# Patient Record
Sex: Male | Born: 1945 | Race: White | Hispanic: No | Marital: Single | Smoking: Former smoker
Health system: Southern US, Community
[De-identification: ages and names within clinical notes are randomized; demographics above are authoritative.]

## PROBLEM LIST (undated history)

## (undated) DIAGNOSIS — I639 Cerebral infarction, unspecified: Secondary | ICD-10-CM

## (undated) DIAGNOSIS — I10 Essential (primary) hypertension: Secondary | ICD-10-CM

## (undated) DIAGNOSIS — E119 Type 2 diabetes mellitus without complications: Secondary | ICD-10-CM

## (undated) HISTORY — PX: KNEE SURGERY: SHX244

## (undated) HISTORY — PX: PROSTATE SURGERY: SHX751

---

## 1998-06-20 ENCOUNTER — Other Ambulatory Visit: Admission: RE | Admit: 1998-06-20 | Discharge: 1998-06-20 | Payer: Self-pay | Admitting: Urology

## 1999-05-24 ENCOUNTER — Other Ambulatory Visit: Admission: RE | Admit: 1999-05-24 | Discharge: 1999-05-24 | Payer: Self-pay | Admitting: Urology

## 1999-07-20 ENCOUNTER — Encounter: Payer: Self-pay | Admitting: Urology

## 1999-07-23 ENCOUNTER — Inpatient Hospital Stay (HOSPITAL_COMMUNITY): Admission: RE | Admit: 1999-07-23 | Discharge: 1999-07-26 | Payer: Self-pay | Admitting: Urology

## 2004-08-01 ENCOUNTER — Ambulatory Visit: Payer: Self-pay | Admitting: *Deleted

## 2006-02-05 ENCOUNTER — Ambulatory Visit: Payer: Self-pay | Admitting: Family Medicine

## 2006-02-12 ENCOUNTER — Ambulatory Visit: Payer: Self-pay | Admitting: Family Medicine

## 2006-09-25 ENCOUNTER — Ambulatory Visit: Payer: Self-pay | Admitting: Family Medicine

## 2013-07-31 ENCOUNTER — Emergency Department (HOSPITAL_COMMUNITY): Payer: Medicare Other

## 2013-07-31 ENCOUNTER — Inpatient Hospital Stay (HOSPITAL_COMMUNITY): Payer: Medicare Other

## 2013-07-31 ENCOUNTER — Encounter (HOSPITAL_COMMUNITY): Payer: Self-pay | Admitting: Emergency Medicine

## 2013-07-31 ENCOUNTER — Inpatient Hospital Stay (HOSPITAL_COMMUNITY)
Admission: EM | Admit: 2013-07-31 | Discharge: 2013-08-05 | DRG: 066 | Disposition: A | Discharge status: Rehabilitation Facility | Payer: Medicare Other | Attending: Family Medicine | Admitting: Family Medicine

## 2013-07-31 DIAGNOSIS — Z87891 Personal history of nicotine dependence: Secondary | ICD-10-CM

## 2013-07-31 DIAGNOSIS — I10 Essential (primary) hypertension: Secondary | ICD-10-CM | POA: Diagnosis present

## 2013-07-31 DIAGNOSIS — F418 Other specified anxiety disorders: Secondary | ICD-10-CM | POA: Diagnosis present

## 2013-07-31 DIAGNOSIS — R471 Dysarthria and anarthria: Secondary | ICD-10-CM

## 2013-07-31 DIAGNOSIS — E871 Hypo-osmolality and hyponatremia: Secondary | ICD-10-CM

## 2013-07-31 DIAGNOSIS — E119 Type 2 diabetes mellitus without complications: Secondary | ICD-10-CM | POA: Diagnosis present

## 2013-07-31 DIAGNOSIS — IMO0002 Reserved for concepts with insufficient information to code with codable children: Secondary | ICD-10-CM

## 2013-07-31 DIAGNOSIS — H532 Diplopia: Secondary | ICD-10-CM | POA: Diagnosis present

## 2013-07-31 DIAGNOSIS — F1010 Alcohol abuse, uncomplicated: Secondary | ICD-10-CM | POA: Diagnosis present

## 2013-07-31 DIAGNOSIS — R4789 Other speech disturbances: Secondary | ICD-10-CM | POA: Diagnosis present

## 2013-07-31 DIAGNOSIS — I6350 Cerebral infarction due to unspecified occlusion or stenosis of unspecified cerebral artery: Principal | ICD-10-CM | POA: Diagnosis present

## 2013-07-31 DIAGNOSIS — F341 Dysthymic disorder: Secondary | ICD-10-CM | POA: Diagnosis present

## 2013-07-31 DIAGNOSIS — Z7982 Long term (current) use of aspirin: Secondary | ICD-10-CM

## 2013-07-31 DIAGNOSIS — R29898 Other symptoms and signs involving the musculoskeletal system: Secondary | ICD-10-CM | POA: Diagnosis present

## 2013-07-31 DIAGNOSIS — R29810 Facial weakness: Secondary | ICD-10-CM | POA: Diagnosis present

## 2013-07-31 DIAGNOSIS — R531 Weakness: Secondary | ICD-10-CM

## 2013-07-31 DIAGNOSIS — I639 Cerebral infarction, unspecified: Secondary | ICD-10-CM | POA: Diagnosis present

## 2013-07-31 DIAGNOSIS — Z79899 Other long term (current) drug therapy: Secondary | ICD-10-CM

## 2013-07-31 DIAGNOSIS — Z7902 Long term (current) use of antithrombotics/antiplatelets: Secondary | ICD-10-CM

## 2013-07-31 HISTORY — DX: Type 2 diabetes mellitus without complications: E11.9

## 2013-07-31 HISTORY — DX: Essential (primary) hypertension: I10

## 2013-07-31 LAB — CBC
Hemoglobin: 15.5 g/dL (ref 13.0–17.0)
MCH: 33.5 pg (ref 26.0–34.0)
MCV: 95.9 fL (ref 78.0–100.0)
Platelets: 257 10*3/uL (ref 150–400)
RDW: 11.9 % (ref 11.5–15.5)
WBC: 7.7 10*3/uL (ref 4.0–10.5)

## 2013-07-31 LAB — COMPREHENSIVE METABOLIC PANEL
ALT: 53 U/L (ref 0–53)
AST: 33 U/L (ref 0–37)
Calcium: 10.3 mg/dL (ref 8.4–10.5)
Chloride: 94 mEq/L — ABNORMAL LOW (ref 96–112)
GFR calc non Af Amer: 84 mL/min — ABNORMAL LOW (ref >90)
Sodium: 134 mEq/L — ABNORMAL LOW (ref 135–145)
Total Protein: 7.4 g/dL (ref 6.0–8.3)

## 2013-07-31 LAB — DIFFERENTIAL
Basophils Absolute: 0 10*3/uL (ref 0.0–0.1)
Eosinophils Absolute: 0.1 10*3/uL (ref 0.0–0.7)
Eosinophils Relative: 1 % (ref 0–5)
Lymphocytes Relative: 15 % (ref 12–46)
Lymphs Abs: 1.1 10*3/uL (ref 0.7–4.0)
Neutro Abs: 5.8 10*3/uL (ref 1.7–7.7)
Neutrophils Relative %: 75 % (ref 43–77)

## 2013-07-31 LAB — URINALYSIS, ROUTINE W REFLEX MICROSCOPIC
Glucose, UA: NEGATIVE mg/dL
Hgb urine dipstick: NEGATIVE
Leukocytes, UA: NEGATIVE
Nitrite: NEGATIVE
Protein, ur: NEGATIVE mg/dL
Specific Gravity, Urine: 1.023 (ref 1.005–1.030)
Urobilinogen, UA: 0.2 mg/dL (ref 0.0–1.0)

## 2013-07-31 LAB — POCT I-STAT TROPONIN I: Troponin i, poc: 0 ng/mL (ref 0.00–0.08)

## 2013-07-31 LAB — GLUCOSE, CAPILLARY
Glucose-Capillary: 166 mg/dL — ABNORMAL HIGH (ref 70–99)
Glucose-Capillary: 115 mg/dL — ABNORMAL HIGH (ref 70–99)

## 2013-07-31 LAB — PROTIME-INR
INR: 1 (ref 0.00–1.49)
Prothrombin Time: 13 seconds (ref 11.6–15.2)

## 2013-07-31 LAB — TROPONIN I: Troponin I: <0.3 ng/mL (ref <0.30)

## 2013-07-31 LAB — ETHANOL: Alcohol, Ethyl (B): <11 mg/dL (ref 0–11)

## 2013-07-31 LAB — RAPID URINE DRUG SCREEN, HOSP PERFORMED
Amphetamines: NOT DETECTED
Barbiturates: NOT DETECTED
Benzodiazepines: POSITIVE — AB
Opiates: POSITIVE — AB

## 2013-07-31 MED ORDER — ASPIRIN 300 MG RE SUPP: 300.0000 mg | Freq: Once | RECTAL | Status: DC

## 2013-07-31 MED ORDER — ASPIRIN 325 MG PO TABS
325.0000 mg | ORAL_TABLET | Freq: Every day | ORAL | Status: DC
  Administered 2013-08-02 – 2013-08-05 (×3): 325 mg via ORAL
  Filled 2013-07-31 (×5): qty 1

## 2013-07-31 MED ORDER — SODIUM CHLORIDE 0.9 % IV SOLN
Freq: Once | INTRAVENOUS | Status: AC
  Administered 2013-07-31: 17:00:00 via INTRAVENOUS

## 2013-07-31 MED ORDER — THIAMINE HCL 100 MG/ML IJ SOLN
100.0000 mg | Freq: Every day | INTRAMUSCULAR | Status: DC
  Administered 2013-08-01 – 2013-08-03 (×2): 100 mg via INTRAVENOUS
  Filled 2013-07-31 (×5): qty 1

## 2013-07-31 MED ORDER — MORPHINE SULFATE 4 MG/ML IJ SOLN
4.0000 mg | Freq: Once | INTRAMUSCULAR | Status: AC
  Administered 2013-07-31: 4 mg via INTRAVENOUS
  Filled 2013-07-31: qty 1

## 2013-07-31 MED ORDER — SODIUM CHLORIDE 0.9 % IV SOLN
INTRAVENOUS | Status: AC
  Administered 2013-07-31: 20:00:00 via INTRAVENOUS

## 2013-07-31 MED ORDER — FOLIC ACID 1 MG PO TABS
1.0000 mg | ORAL_TABLET | Freq: Every day | ORAL | Status: DC
  Administered 2013-08-02: 1 mg via ORAL
  Filled 2013-07-31 (×4): qty 1

## 2013-07-31 MED ORDER — HEPARIN SODIUM (PORCINE) 5000 UNIT/ML IJ SOLN
5000.0000 [IU] | Freq: Three times a day (TID) | INTRAMUSCULAR | Status: DC
  Administered 2013-07-31 – 2013-08-05 (×15): 5000 [IU] via SUBCUTANEOUS
  Filled 2013-07-31 (×17): qty 1

## 2013-07-31 MED ORDER — SENNOSIDES-DOCUSATE SODIUM 8.6-50 MG PO TABS: 1.0000 | ORAL_TABLET | Freq: Every evening | ORAL | Status: DC | PRN

## 2013-07-31 MED ORDER — VITAMIN B-1 100 MG PO TABS
100.0000 mg | ORAL_TABLET | Freq: Every day | ORAL | Status: DC
  Administered 2013-08-02 – 2013-08-05 (×3): 100 mg via ORAL
  Filled 2013-07-31 (×5): qty 1

## 2013-07-31 MED ORDER — ONDANSETRON HCL 4 MG/2ML IJ SOLN
4.0000 mg | Freq: Once | INTRAMUSCULAR | Status: AC
  Administered 2013-07-31: 4 mg via INTRAVENOUS
  Filled 2013-07-31: qty 2

## 2013-07-31 MED ORDER — FLUTICASONE PROPIONATE 50 MCG/ACT NA SUSP
2.0000 | Freq: Two times a day (BID) | NASAL | Status: DC
  Administered 2013-07-31 – 2013-08-03 (×7): 2 via NASAL
  Filled 2013-07-31 (×2): qty 16

## 2013-07-31 MED ORDER — ASPIRIN 300 MG RE SUPP
300.0000 mg | Freq: Every day | RECTAL | Status: DC
  Administered 2013-07-31 – 2013-08-01 (×2): 300 mg via RECTAL
  Filled 2013-07-31 (×6): qty 1

## 2013-07-31 MED ORDER — LORAZEPAM 2 MG/ML IJ SOLN
1.0000 mg | Freq: Four times a day (QID) | INTRAMUSCULAR | Status: DC | PRN
  Administered 2013-07-31 – 2013-08-01 (×2): 1 mg via INTRAVENOUS
  Filled 2013-07-31 (×2): qty 1

## 2013-07-31 MED ORDER — ADULT MULTIVITAMIN W/MINERALS CH
1.0000 | ORAL_TABLET | Freq: Every day | ORAL | Status: DC
  Administered 2013-08-02: 1 via ORAL
  Filled 2013-07-31 (×4): qty 1

## 2013-07-31 MED ORDER — LORAZEPAM 1 MG PO TABS: 1.0000 mg | ORAL_TABLET | Freq: Four times a day (QID) | ORAL | Status: DC | PRN

## 2013-07-31 NOTE — ED Provider Notes (Signed)
CSN: [id number]     Arrival date & time 07/31/13  1324 History   First MD Initiated Contact with Patient 07/31/13 1340     Chief Complaint  Patient presents with  . Facial Droop   (Consider location/radiation/quality/duration/timing/severity/associated sxs/prior Treatment) HPI Patient brought in by son for dysarthria that patient states began around 11am. Son last saw patient last night.  Has had 2- 3 weeks of headache and neck pain, 1 week of gait imbalance, 2 days of diplopia, and 1 day of dysarthria and left sided facial droop.  Pt was seen at [facility] ED for diplopia last night, had a negative CT scan and was sent home with diagnosis TIA and right sided weakness (per discharge papers), supposed to follow up with PCP for MRI.  Patient states he has had a severe headache for the past 2-3 weeks as well as neck pain.  He has been treated for sinusitis with some improvement. He was also seen by his PCP who thought he may have shingles on his head causing his headache.  Patient and son state patient has been off balance for approximately 1 week.  States he does have sore throat and difficulty swallowing.  Double vision is persistent and is stacked vertically.  Denies fevers, chills, nasal congestion, cough, chest pain, abdominal pain, N/V/D, leg swelling, palpitations.     Past Medical History  Diagnosis Date  . Diabetes mellitus without complication   . Hypertension    Past Surgical History  Procedure Laterality Date  . Prostate surgery    . Knee surgery     No family history on file. History  Substance Use Topics  . Smoking status: Former Games developer  . Smokeless tobacco: Not on file  . Alcohol Use: Yes     Comment: occ    Review of Systems  Unable to perform ROS: Acuity of condition  Constitutional: Negative for fever and chills.  HENT: Positive for sore throat.   Eyes: Positive for visual disturbance.  Respiratory: Positive for shortness of breath (with many people crowding around him  and with lying flat).   Cardiovascular: Negative for chest pain, palpitations and leg swelling.  Gastrointestinal: Negative for vomiting, abdominal pain and diarrhea.  Endocrine: Positive for polyuria.  Genitourinary: Positive for frequency. Negative for dysuria.  Musculoskeletal: Positive for neck pain. Negative for back pain.  Allergic/Immunologic: Negative for immunocompromised state.  Neurological: Positive for dizziness, weakness, light-headedness and headaches.  Hematological: Does not bruise/bleed easily.    Allergies  Review of patient's allergies indicates no known allergies.  Home Medications  No current outpatient prescriptions on file. BP 129/86  Pulse 86  Resp 18  SpO2 98% Physical Exam  Nursing note and vitals reviewed. Constitutional: He appears well-developed and well-nourished. No distress.  HENT:  Head: Normocephalic and atraumatic.  Neck: Neck supple.  Cardiovascular: Normal rate and regular rhythm.   Pulmonary/Chest: Effort normal and breath sounds normal. No respiratory distress. He has no wheezes. He has no rales.  Abdominal: Soft. He exhibits no distension and no mass. There is no tenderness. There is no rebound and no guarding.  Neurological: He is alert. He is not disoriented. A cranial nerve deficit is present. He exhibits normal muscle tone. Coordination abnormal. GCS eye subscore is 4. GCS verbal subscore is 5. GCS motor subscore is 6.  Left facial droop. +dysarthria EOMs intact, tongue is midline, soft palate raises symmetrically.  Finger to nose abnormal, Left worse than right.  Grip strengths and extremity strength equal.  Pt  unable to do heel to shin due to generalized weakness.  He flops hands abnormally with ROM.  No pronator drift, though patient generally weak and unable to fully raise hands in front of him.   Skin: He is not diaphoretic.    ED Course  Procedures (including critical care time) Labs Review Labs Reviewed  GLUCOSE, CAPILLARY -  Abnormal; Notable for the following:    Glucose-Capillary 166 (*)    All other components within normal limits  COMPREHENSIVE METABOLIC PANEL - Abnormal; Notable for the following:    Sodium 134 (*)    Chloride 94 (*)    Glucose, Bld 157 (*)    GFR calc non Af Amer 84 (*)    All other components within normal limits  PROTIME-INR  APTT  CBC  DIFFERENTIAL  TROPONIN I  ETHANOL  URINALYSIS, ROUTINE W REFLEX MICROSCOPIC  URINE RAPID DRUG SCREEN (HOSP PERFORMED)  POCT I-STAT TROPONIN I   Imaging Review Ct Head Wo Contrast  07/31/2013   CLINICAL DATA:  Left-sided weakness, facial droop.  EXAM: CT HEAD WITHOUT CONTRAST  TECHNIQUE: Contiguous axial images were obtained from the base of the skull through the vertex without intravenous contrast.  COMPARISON:  07/30/2013  FINDINGS: Atherosclerotic and physiologic intracranial calcifications. Mild atrophy. There is no evidence of acute intracranial hemorrhage, brain edema, mass lesion, acute infarction, mass effect, or midline shift. Acute infarct may be inapparent on noncontrast CT. No other intra-axial abnormalities are seen, and the ventricles and sulci are within normal limits in size and symmetry. No abnormal extra-axial fluid collections or masses are identified. No significant calvarial abnormality.  IMPRESSION: No bleed or other acute intracranial process.   Electronically Signed   By: Oley Balm M.D.   On: 07/31/2013 15:03   Dg Chest Portable 1 View  07/31/2013   CLINICAL DATA:  Diabetes.  Facial droop.  EXAM: PORTABLE CHEST - 1 VIEW  COMPARISON:  None.  FINDINGS: The heart size and mediastinal contours are within normal limits. Both lungs are clear. The visualized skeletal structures are unremarkable.  IMPRESSION: No active disease.   Electronically Signed   By: Maisie Fus  Register   On: 07/31/2013 15:32    EKG Interpretation   None      1:53 PM Dr Juleen China made aware and has seen and examined the patient.   3:25 PM Discussed patient  with Family Medicine resident who accepts patient for admission.    Date: 07/31/2013  Rate: 87  Rhythm: normal sinus rhythm  QRS Axis: normal  Intervals: normal  ST/T Wave abnormalities: nonspecific ST/T changes  Conduction Disutrbances:none  Narrative Interpretation:   Old EKG Reviewed: no significant changes.  T wave flattening has improved    MDM   1. Dysarthria   2. Facial droop   3. Diplopia      Patient with several weeks of various symptoms including severe headache and neck pain that was gradual in onset 2-3 weeks ago, has improved since then, 1 week of being off balance, 2 days of diplopia, 1 day of dysarthria and facial droop.  [place] dx with sinusitis, by PCP 5 days ago thought to have shingles, at [facility] ED last night, diagnosed with TIA and right sided weakness and d/c home.  PCP is Dr Lysbeth Galas in [place], [place].   Labs unremarkable, CT negative.  MRI ordered.  Admitted for presumes stroke.  Pending swallow study, aspirin not given - Family Medicine resident made aware.  Patient admitted to [facility].      Trixie Dredge, PA-C 07/31/13 [phone]

## 2013-07-31 NOTE — ED Notes (Signed)
Per son pt was last seen normal at 0800 yesterday am, states pt woke up yesterday not feeling well, states approx 1100 they noticed pt's speech had become slurred, unable to keep his balance, and Left side weakness. Pt was seen yesterday at an ED in [place] had a CT scan that was negative for a stroke and sent home. Pt's symptoms worsened over night and now has Left side facial droop, increase slurred speech, drooling from Left side of mouth, and confused

## 2013-07-31 NOTE — ED Notes (Signed)
Family at bedside and updated on pt's status 

## 2013-07-31 NOTE — ED Notes (Signed)
Pt still in MRI, tech called requesting an order for an MRA due to brain stem infarct, EDP Kohut placed order

## 2013-07-31 NOTE — ED Notes (Signed)
Attempted to obtain urine sample via in and out cath. Attempt unsuccessful.

## 2013-07-31 NOTE — H&P (Signed)
Family Medicine Teaching [facility] Admission History and Physical Service Pager: [phone]  Patient name: Keith [name] Medical record number: [id number] Date of birth: 12/21/1945 Age: 67 y.o. Gender: male  Primary Care Provider: Josue Hector, MD Consultants: None Code Status: Full  Chief Complaint: Dysarthria  Assessment and Plan: Keith [name] is a 67 y.o. male presenting with dysarthria, left-sided facial droop, and left-sided UE & LE weakness. PMH is significant for DM, HTN, previous smoker.   # Dysarthria and Facial, UE, LE weakness secondary to Stroke Has been on ASA. CT shows atherosclerotic and physiologic intracranial calcifications w/o signs of acute stroke or hemorrhage. EKG showed NSR. I-stat Troponin neg in ED. 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 - Risk Stratification: ECHO, Carotid doppler, A1c, Lipid, TSH - Admit to tele; Neuro checks, NPO until Swallow study - ASA daily; Will initiate Plavix once able to take PO - Will start statin when able to take PO - PT/OT to eval and treat - Morphine given in ED for HA and anxiety about MRI - UDS and UA pending   # DM - Alc ordered - Holding Metformin while NPO - CBGs Q 4 hours; No insulin at this time as patient is NPO  #HTN - Allow permissive HTN < 220/110 for 24hrs - Holding Cozaar 100mg  qd & Norvasc 5mg  qd; Will restart on 12/1 if needed or at discharge   # Alcohol Abuse - Reports drinking 4-5 beers daily; last drink ~ 1 wk ago; Ethanol level neg on admission - CIWA protocol  FEN/GI: NS @ 75mg ; NPO until passes swallow study Prophylaxis: Heparin subq  Disposition: Admit to tele; Attending Randolm Idol; discharge home pending PT/OT recs  History of Present Illness: Keith [name] is a 67 y.o. male presenting with Dysarthria and left-sided facial droop. He says this began this morning ~ 10:30am  when he woke-up. Yesterday he had Diplopia and was told by [facility] ED that he had TIA after CT was negative. He denies any past strokes or transient diplopia or dysarthria. He denies any chest pain or palpitations, but endorses HA and neck pain for the past several weeks w/o vision or hearing changes or scalp tenderness. He is a previous smoker (2-3ppd for many years), but current does not smoke. He reports drinking 4-5 beers daily; last drink ~ 1 wk ago. He takes a aspirin daily and has been on simvastatin before but stopped it due to hearing that it was "dangerous."  Review Of Systems: Per HPI with the following additions: None Otherwise 12 point review of systems was performed and was unremarkable.  Patient Active Problem List   Diagnosis Date Noted  . Facial droop 07/31/2013   Past Medical History: Past Medical History  Diagnosis Date  . Diabetes mellitus without complication   . Hypertension    Past Surgical History: Past Surgical History  Procedure Laterality Date  . Prostate surgery    . Knee surgery     Social History: History  Substance Use Topics  . Smoking status: Former Games developer  . Smokeless tobacco: Not on file  . Alcohol Use: Yes     Comment: occ   Additional social history: Past smoker  Please also refer to relevant sections of EMR.  Family History: No family history on file. Allergies and Medications: No Known Allergies No current facility-administered medications on file prior to encounter.   No current outpatient prescriptions on file prior to  encounter.    Objective: BP 129/86  Pulse 86  Resp 18  SpO2 98% Exam: General: Elderly male in NAD Neuro: A&Ox4; Short & Long term memory intact; Left-side facial droop, dysarthria, 4/5 weakness with left-side shoulder shrug  other CNs intact; 4/5 strength in Left-side grip & elbow flexion; 3/5 left-side hip flexion; Gross Sensory intact HEENT: Mucus membranes mildly dry; PERRLA CV: RRR, No m/r/g; No Carotid  bruits; No JVD; No LE Edema Lungs: CTAB; Normal Resp. Effort GI:BS +; No tenderness or masses  Labs and Imaging: CBC BMET   Recent Labs Lab 07/31/13 1415  WBC 7.7  HGB 15.5  HCT 44.3  PLT 257    Recent Labs Lab 07/31/13 1415  NA 134*  K 5.1  CL 94*  CO2 29  BUN 20  CREATININE 0.93  GLUCOSE 157*  CALCIUM 10.3      [facility] Contrast 07/31/2013  IMPRESSION: 1. Two small acute brainstem infarcts: -right paracentral pons, - central to left paracentral medulla, possibly involving the left pyramid. 2. No associated mass effect or hemorrhage. 3. Negative intracranial MRA, with patent posterior circulation.  CT Head FINDINGS:  Atherosclerotic and physiologic intracranial calcifications. Mild  atrophy. There is no evidence of acute intracranial hemorrhage,  brain edema, mass lesion, acute infarction, mass effect, or midline  shift. Acute infarct may be inapparent on noncontrast CT. No other  intra-axial abnormalities are seen, and the ventricles and sulci are  within normal limits in size and symmetry. No abnormal extra-axial  fluid collections or masses are identified. No significant calvarial  abnormality.  IMPRESSION:  No bleed or other acute intracranial process  CXR FINDINGS:  The heart size and mediastinal contours are within normal limits.  Both lungs are clear. The visualized skeletal structures are  unremarkable.  IMPRESSION:  No active disease.   Wenda Low, MD 07/31/2013, 4:01 PM PGY-1, [place] [facility] FPTS Intern pager: [phone], text pages welcome  I have seen and examine the above patient and agree with the note.  My additions/addendums are in blue.  Everlene Other DO Family Medicine PGY-2

## 2013-07-31 NOTE — ED Notes (Signed)
Report given to [facility], pt will be transported to 4N room 27 via stretcher on a tele monitor by Mellody Dance EMT

## 2013-07-31 NOTE — ED Notes (Signed)
Patient transported to MRI 

## 2013-07-31 NOTE — ED Notes (Signed)
Per son pt was last seen normal yesterday at 8 am.  Pt was scanned at hospital in [place] and, per son, CT was negative.  Son brought pt in today b/c "he is getting worse".  L sided weakness and facial droop.  Pt ao x 4 with disarthria.

## 2013-07-31 NOTE — ED Notes (Signed)
Pt refused to have MRI due to being claustrophobic. This RN informed MRI it would take me a few minutes due to I was in the middle of another procedure but I would be there.

## 2013-08-01 ENCOUNTER — Inpatient Hospital Stay (HOSPITAL_COMMUNITY): Payer: Medicare Other

## 2013-08-01 DIAGNOSIS — H5310 Unspecified subjective visual disturbances: Secondary | ICD-10-CM

## 2013-08-01 DIAGNOSIS — I10 Essential (primary) hypertension: Secondary | ICD-10-CM

## 2013-08-01 DIAGNOSIS — M6281 Muscle weakness (generalized): Secondary | ICD-10-CM

## 2013-08-01 DIAGNOSIS — E119 Type 2 diabetes mellitus without complications: Secondary | ICD-10-CM

## 2013-08-01 DIAGNOSIS — I519 Heart disease, unspecified: Secondary | ICD-10-CM

## 2013-08-01 DIAGNOSIS — H532 Diplopia: Secondary | ICD-10-CM

## 2013-08-01 DIAGNOSIS — R471 Dysarthria and anarthria: Secondary | ICD-10-CM

## 2013-08-01 DIAGNOSIS — R29810 Facial weakness: Secondary | ICD-10-CM

## 2013-08-01 DIAGNOSIS — I6350 Cerebral infarction due to unspecified occlusion or stenosis of unspecified cerebral artery: Principal | ICD-10-CM

## 2013-08-01 LAB — LIPID PANEL
LDL Cholesterol: 58 mg/dL (ref 0–99)
Total CHOL/HDL Ratio: 2.4 RATIO
Triglycerides: 92 mg/dL (ref <150)

## 2013-08-01 LAB — GLUCOSE, CAPILLARY
Glucose-Capillary: 101 mg/dL — ABNORMAL HIGH (ref 70–99)
Glucose-Capillary: 107 mg/dL — ABNORMAL HIGH (ref 70–99)
Glucose-Capillary: 111 mg/dL — ABNORMAL HIGH (ref 70–99)
Glucose-Capillary: 114 mg/dL — ABNORMAL HIGH (ref 70–99)
Glucose-Capillary: 130 mg/dL — ABNORMAL HIGH (ref 70–99)
Glucose-Capillary: 133 mg/dL — ABNORMAL HIGH (ref 70–99)

## 2013-08-01 LAB — HEMOGLOBIN A1C
Hgb A1c MFr Bld: 7.4 % — ABNORMAL HIGH (ref <5.7)
Mean Plasma Glucose: 166 mg/dL — ABNORMAL HIGH (ref <117)

## 2013-08-01 MED ORDER — DIPHENHYDRAMINE HCL 50 MG/ML IJ SOLN: 25.0000 mg | Freq: Once | INTRAMUSCULAR | Status: DC

## 2013-08-01 MED ORDER — SODIUM CHLORIDE 0.9 % IV SOLN
INTRAVENOUS | Status: DC
  Administered 2013-08-01 – 2013-08-05 (×8): via INTRAVENOUS

## 2013-08-01 MED ORDER — MORPHINE SULFATE 2 MG/ML IJ SOLN
2.0000 mg | INTRAMUSCULAR | Status: DC | PRN
  Administered 2013-08-01 – 2013-08-05 (×7): 2 mg via INTRAVENOUS
  Filled 2013-08-01 (×7): qty 1

## 2013-08-01 NOTE — Plan of Care (Signed)
Problem: Progression Outcomes Goal: Tolerating diet/TF at goal rate Outcome: Not Met (add Reason) Failed modified barium swallow study

## 2013-08-01 NOTE — Progress Notes (Signed)
FMTS Attending Note  I personally saw and evaluated the patient. The plan of care was discussed with the resident team. I agree with the assessment and plan as documented by the resident.   Danese Dorsainvil MD 

## 2013-08-01 NOTE — Progress Notes (Signed)
Family Medicine Teaching Service Daily Progress Note Intern Pager: [phone]  Patient name: Keith [name] Medical record number: [id number] Date of birth: [birth year] Age: 67 y.o. Gender: male  Primary Care Provider: Josue Hector, MD Consultants: None Code Status: Full Code  Pt Overview and Major Events to Date:   Assessment and Plan: Keith [name] is a 67 y.o. male presenting with dysarthria, left-sided facial droop, and left-sided UE & LE weakness. PMH is significant for DM, HTN, previous smoker.   # Dysarthria and Facial, UE, LE weakness secondary to Stroke  Has been on ASA. CT shows atherosclerotic and physiologic intracranial calcifications w/o signs of acute stroke or hemorrhage. EKG showed NSR. I-stat Troponin neg in ED. CBC&CMET wnl. Lipid panel unremarkable.  - MRI: revealed two small acute brainstem infarcts: right paracentral pons, central to left paracentral medulla, possibly involving the left pyramid.  - Risk Stratification: ECHO, Carotid doppler, A1c, TSH  - ASA daily; Will initiate Plavix once able to take PO  - Will start statin ifable to take PO  - follow-up PT/OT to eval and treat  - Morphine given in ED for HA and anxiety about MRI  - UDS pending  # DM  - A1c ordered  - Holding Metformin while NPO  - CBGs Q 4 hours; No insulin at this time as patient is NPO   #HTN  - Allow permissive HTN < 220/110 for 24hrs  - Holding Cozaar 100mg  qd & Norvasc 5mg  qd; Will restart on 12/1 if needed or at discharge   # Alcohol Abuse  - Reports drinking 4-5 beers daily; last drink ~ 1 wk ago; Ethanol level neg on admission  - CIWA protocol   FEN/GI: NS @ 75mg ; NPO until results of swallow study  Prophylaxis: Heparin subq  Disposition: pending clinical workup, SLP, PT, OT recommendations  Subjective: Patient is feeling better overall. He is still having generalized weakness which he says has not changed.   Objective: Temp:  [98 F (36.7 C)-98.6 F (37 C)] 98 F (36.7  C) (11/30 0629) Pulse Rate:  [59-86] 71 (11/30 0629) Resp:  [15-18] 18 (11/30 0629) BP: (114-136)/(65-86) 117/66 mmHg (11/30 0629) SpO2:  [94 %-100 %] 100 % (11/30 0629) Weight:  [170 lb 6.4 oz (77.293 kg)] 170 lb 6.4 oz (77.293 kg) (11/29 1855)  Physical Exam: General: elderly male, laying in bed in no acute distress, son and daughter in  Cardiovascular: Regular rate/rhythm Respiratory: Clear to auscultation bilaterally, no wheezes Abdomen: Soft, non-tender, non-distended Extremities: No edema Neuro: Alert and oriented x3. Left sided facial droop, CN 3-8 intact. Left CN 9/10 deficit with +Right uvula deviation. Left CN11 deficit with shoulder shrug deficit.  Laboratory:  Recent Labs Lab 07/31/13 1415  WBC 7.7  HGB 15.5  HCT 44.3  PLT 257    Recent Labs Lab 07/31/13 1415  NA 134*  K 5.1  CL 94*  CO2 29  BUN 20  CREATININE 0.93  CALCIUM 10.3  PROT 7.4  BILITOT 0.5  ALKPHOS 59  ALT 53  AST 33  GLUCOSE 157*   Lipid Panel     Component Value Date/Time   CHOL 129 08/01/2013 0624   TRIG 92 08/01/2013 0624   HDL 53 08/01/2013 0624   CHOLHDL 2.4 08/01/2013 0624   VLDL 18 08/01/2013 0624   LDLCALC 58 08/01/2013 0624    Imaging/Diagnostic Tests:  No recent imaging  Jacquelin Hawking, MD 08/01/2013, 6:48 AM PGY-1, [facility] FPTS Intern pager: [phone], text pages welcome

## 2013-08-01 NOTE — Progress Notes (Signed)
  Echocardiogram 2D Echocardiogram has been performed.  Keith [name] 08/01/2013, 9:28 AM

## 2013-08-01 NOTE — Procedures (Addendum)
Objective Swallowing Evaluation: Modified Barium Swallowing Study  Patient Details  Name: Keith [name] MRN: [id number] Date of Birth: Nov 03, 1945  Today's Date: 08/01/2013 Time: 1030-1101 SLP Time Calculation (min): 31 min  Past Medical History:  Past Medical History  Diagnosis Date  . Diabetes mellitus without complication   . Hypertension    Past Surgical History:  Past Surgical History  Procedure Laterality Date  . Prostate surgery    . 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  MBS ordered due to concerns for dysphagia.  Pt admits to h/o "sore throat" and transient problems swallowing in the last few weeks.  Pt is a previous smoker, CXR negative 11/29.       Assessment / Plan / Recommendation Clinical Impression  Dysphagia Diagnosis: Moderate oral phase dysphagia;Severe pharyngeal phase dysphagia;Severe cervical esophageal phase dysphagia   Clinical impression: Pt presents with moderate oral and severe pharyngeal-cervical esophageal phase dysphagia due to brainstem infarcts.  Pt's dysphagia characterized by delayed oral transiting/coordination resulting in premature spillage of boluses into pharynx.  Pharyngeal swallow was delayed with boluses spilling INTO larynx prior to swallow initiation (epiglottis positioning obliterates vallecular space).  Severe residuals noted due to pt weakness that were not prevented with chin tuck with and without head turn left.  Cued dry swallows were not effective to fully clear pharynx nor was cueing pt to "hock" - approx 30% was retained.   Pt is a very high aspiration risk currently due to level of dysphagia due to mostly glossopharyngeal, vagus and hypoglossal nerve deficits.     Skilled intervention included trials of compensation strategies, pt education to findings using monitor  and verbal feedback as well as teach back and goals of treatment.  Pt reports pain in neck and this may limit functional compensation strategies.    Rec consider short term alternative means of nutrition and SLP follow up to determine readiness for repeat instumental eval.  Please note, pt admits to dysphagia symptoms including "left throat swelling" and sore throat for 2 weeks prior to admission.     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  MBS ordered due to concerns for dysphagia.  Pt admits to h/o "sore throat" and transient problems swallowing in the last few weeks.  Pt is a previous smoker, CXR negative 11/29.   Type of Study: Modified Barium Swallowing Study Reason for Referral: Objectively evaluate swallowing function Diet Prior to this Study: NPO Temperature Spikes Noted: No Respiratory Status: Nasal cannula Behavior/Cognition: Alert;Cooperative;Pleasant mood Oral Cavity - Dentition: Adequate natural dentition Oral Motor / Sensory Function: Impaired motor;Impaired sensory Oral impairment: Left lingual;Left facial;Left labial (palatal elevation deviates to right, decreased facial sensation) Self-Feeding  Abilities: Total assist Patient Positioning: Upright in chair Baseline Vocal Quality: Low vocal intensity;Hoarse Volitional Cough: Weak Volitional Swallow: Able to elicit Anatomy:  Other (Comment) (appearance of cervical osteophyte C4-C6, pt reports neck pain) Pharyngeal Secretions: Standing secretions in (comment) (pharynx, mixed with barium and did not clear)    Reason for Referral Objectively evaluate swallowing function   Oral Phase Oral Preparation/Oral Phase Oral Phase: Impaired Oral - Honey Oral - Honey Teaspoon: Delayed oral transit;Weak lingual manipulation;Reduced posterior propulsion;Right anterior bolus loss Oral - Nectar Oral - Nectar Teaspoon: Delayed oral transit;Right anterior bolus loss;Weak lingual manipulation;Reduced posterior propulsion Oral - Thin Oral - Thin Teaspoon: Weak lingual manipulation;Delayed oral transit;Incomplete tongue to palate contact;Reduced posterior propulsion Oral - Solids Oral - Puree: Reduced posterior propulsion;Delayed oral transit;Weak lingual manipulation Oral - Regular: Not tested   Pharyngeal Phase Pharyngeal Phase Pharyngeal Phase: Impaired Pharyngeal - Honey Pharyngeal - Honey Teaspoon: Pharyngeal residue - valleculae;Pharyngeal residue - pyriform sinuses;Reduced tongue base retraction;Penetration/Aspiration before swallow;Penetration/Aspiration after swallow;Reduced laryngeal elevation;Reduced anterior laryngeal mobility;Reduced epiglottic inversion;Reduced pharyngeal peristalsis;Reduced airway/laryngeal closure Penetration/Aspiration details (honey teaspoon): Material enters airway, CONTACTS cords and not ejected out Pharyngeal - Nectar Pharyngeal - Nectar Teaspoon: Delayed swallow initiation;Premature spillage to valleculae;Reduced pharyngeal peristalsis;Reduced epiglottic inversion;Reduced anterior laryngeal mobility;Reduced laryngeal elevation;Reduced airway/laryngeal closure;Reduced tongue base retraction;Pharyngeal residue - pyriform sinuses;Pharyngeal residue - valleculae;Penetration/Aspiration before swallow;Penetration/Aspiration after swallow Penetration/Aspiration details (nectar teaspoon): Material enters  airway, passes BELOW cords and not ejected out despite cough attempt by patient Pharyngeal - Thin Pharyngeal - Thin Teaspoon: Premature spillage to pyriform sinuses;Reduced pharyngeal peristalsis;Reduced epiglottic inversion;Reduced anterior laryngeal mobility;Reduced laryngeal elevation;Reduced airway/laryngeal closure;Reduced tongue base retraction;Penetration/Aspiration before swallow;Lateral channel residue;Pharyngeal residue - valleculae;Pharyngeal residue - pyriform sinuses;Delayed swallow initiation Penetration/Aspiration details (thin teaspoon): Material enters airway, CONTACTS cords and not ejected out Pharyngeal - Solids Pharyngeal - Puree: Premature spillage to valleculae;Delayed swallow initiation;Reduced pharyngeal peristalsis;Reduced epiglottic inversion;Reduced anterior laryngeal mobility;Reduced laryngeal elevation;Reduced airway/laryngeal closure;Reduced tongue base retraction;Penetration/Aspiration before swallow;Lateral channel residue;Pharyngeal residue - valleculae;Pharyngeal residue - pyriform sinuses Penetration/Aspiration details (puree): Material enters airway, remains ABOVE vocal cords and not ejected out Pharyngeal - Regular: Not tested  Cervical Esophageal Phase    GO    Cervical Esophageal Phase Cervical Esophageal Phase: Impaired Cervical Esophageal Phase - Comment Cervical Esophageal Comment: decreased UES opening likely due to poor laryngeal elevation, results in stasis that mixed with barium - pt can not clear with cued "hock" nor dry swallows         Donavan Burnet, MS [facility] SLP [phone]

## 2013-08-01 NOTE — Progress Notes (Signed)
VASCULAR LAB PRELIMINARY  PRELIMINARY  PRELIMINARY  PRELIMINARY  Carotid Dopplers completed.    Preliminary report:  There is 1-39% ICA stenosis.  Vertebral artery flow is antegrade.  Keith [name], RVT 08/01/2013, 9:17 AM

## 2013-08-01 NOTE — H&P (Signed)
FMTS Attending Note  I personally saw and evaluated the patient. The plan of care was discussed with the resident team. I agree with the assessment and plan as documented by the resident.   67 y/o male with PMH diabetes, hypertension, former smoker presents with left sided facial droop and slurred speech that has been worsening over the past few days, Please refer to resident note for full HPI. Patient continues to have left sided facial droop, dysarthria, and generalized weakness (L>R).  Vitals: reviewed Gen: pleasant caucasian male, sitting in hospital bed, accompanied by his son, NAD, difficult to understand speech due to dysarthria HEENT: PERRL, EOMI, no scleral icterus, nasal septum midline, no rhinorrhea, MMM, uvula midline, no pharyngeal erythema or exudate, bilateral TM pearly grey without erythema or bulging, neck supple, no thyromegally, no cervical adenopathy Cardiac: RRR, S1 and S2 present, no murmurs, no heaves/thrills, no JVD, no carotid bruits Resp: CTAB, normal effort Abd: soft, no tenderness, normal bowel sounds Neuro: CN 2- 12 intact (except left sided facial droop, dysarthria, shug slightly weaker on the left side), strength 4/5 in all extremities, no gross sensation deficits, 2 + biceps/brachialadialis/patellar/achilles reflexes bilaterally, bilateral finger to nose intact, no pronator drift Ext: no edema, 2+ radial and DP pulses Skin: no rash  1. Dysarthria/facial droop/weakness - MRI consistent with brainstem stroke, will add Plavix to home ASA 325 for 30 days then transition to Plavix, start on Lipitor 40 mg daily, awaiting risk statification labs, appreciate PT/OT recs 2. Diabetes - agree with A1C, hold Metformin while NPO 3. HTN - controlled off medications, hold home medications to allow permissive hypertension  Donnella Sham MD

## 2013-08-01 NOTE — Evaluation (Signed)
Physical Therapy Evaluation Patient Details Name: Keith [name] MRN: [id number] DOB: 1946/05/22 Today's Date: 08/01/2013 Time: 7829-5621 PT Time Calculation (min): 26 min  PT Assessment / Plan / Recommendation History of Present Illness  Keith [name] is a 67 y.o. male presenting with dysarthria, left-sided facial droop, and left-sided UE & LE weakness. PMH is significant for DM, HTN, previous smoker. MRI finds:  right paracentral pons; left paracentral medulla  Clinical Impression  Pt admitted with the above. Pt currently with functional limitations due to the deficits listed below (see PT Problem List). Pt with right sided weakness limiting ambulation and causing balance impairment.  Pt will benefit from skilled PT to increase their independence and safety with mobility to allow discharge to the venue listed below.      PT Assessment  Patient needs continued PT services    Follow Up Recommendations  CIR    Equipment Recommendations  Other (comment) (TBD)    Recommendations for Other Services Rehab consult   Frequency Min 4X/week    Precautions / Restrictions Precautions Precautions: Fall   Pertinent Vitals/Pain No c/o pain      Mobility  Bed Mobility Bed Mobility: Supine to Sit;Sitting - Scoot to Edge of Bed Supine to Sit: With rails;4: Min assist Sitting - Scoot to [place] of Bed: 4: Min assist Sit to Supine: 4: Min assist Details for Bed Mobility Assistance: (A) for overall balance during supine > sit Transfers Transfers: Sit to Stand;Stand to Sit Sit to Stand: 1: +2 Total assist;From bed Sit to Stand: Patient Percentage: 50% Stand to Sit: 1: +2 Total assist;To bed Stand to Sit: Patient Percentage: 50% Details for Transfer Assistance: +2 (A) to initiate transfer and slowly descend with cues for hand placement.  Cues for midline and forward translation during transfers.  Ambulation/Gait Ambulation/Gait Assistance: 1: +2 Total assist Ambulation/Gait: Patient Percentage:  50% Ambulation Distance (Feet): 4 Feet (side steps to [facility]) Assistive device: 2 person hand held assist Ambulation/Gait Assistance Details: (A) to maintain balance with cues for proper weight shift to advance LEs.  Gait Pattern: Step-to pattern;Decreased stance time - right;Shuffle;Lateral trunk lean to right Gait velocity: decreased Stairs: No Modified Rankin (Stroke Patients Only) Pre-Morbid Rankin Score: No symptoms Modified Rankin: Severe disability    Exercises     PT Diagnosis: Difficulty walking;Generalized weakness  PT Problem List: Decreased strength;Decreased activity tolerance;Decreased balance;Decreased mobility;Decreased coordination;Decreased cognition;Decreased knowledge of use of DME;Decreased safety awareness PT Treatment Interventions: DME instruction;Gait training;Functional mobility training;Therapeutic activities;Therapeutic exercise;Balance training;Neuromuscular re-education;Cognitive remediation;Patient/family education     PT Goals(Current goals can be found in the care plan section) Acute Rehab PT Goals Patient Stated Goal: did not set PT Goal Formulation: With patient/family Time For Goal Achievement: 08/15/13 Potential to Achieve Goals: Good  Visit Information  Last PT Received On: 08/01/13 Assistance Needed: +2 History of Present Illness: Keith [name] is a 67 y.o. male presenting with dysarthria, left-sided facial droop, and left-sided UE & LE weakness. PMH is significant for DM, HTN, previous smoker. MRI finds:  right paracentral pons; left paracentral medulla       Prior Functioning  Home Living Family/patient expects to be discharged to:: Private residence Living Arrangements: Alone Available Help at Discharge: Family Type of Home: House Home Access: Stairs to enter Secretary/administrator of Steps: 6 Entrance Stairs-Rails: Right;Left Prior Function Level of Independence: Independent Communication Communication: HOH (slurred speech)     Cognition  Cognition Arousal/Alertness: Awake/alert Behavior During Therapy: Flat affect Overall Cognitive Status: Impaired/Different from baseline Area of Impairment: Awareness;Problem  solving;Attention Current Attention Level: Selective Awareness: Emergent Problem Solving: Slow processing;Difficulty sequencing;Requires verbal cues General Comments: Per RN pt hallucinating of black things crawling on him.  Pt restless prior to PT evaluation    Extremity/Trunk Assessment Upper Extremity Assessment Upper Extremity Assessment: RUE deficits/detail RUE Deficits / Details: 4/5 throughout Lower Extremity Assessment Lower Extremity Assessment: RLE deficits/detail RLE Deficits / Details: 4/5 gross   Balance Balance Balance Assessed: Yes Static Sitting Balance Static Sitting - Balance Support: Feet supported;Bilateral upper extremity supported Static Sitting - Level of Assistance: 4: Min assist;5: Stand by assistance Static Sitting - Comment/# of Minutes: Assist to maintain balance in midline and to prevent right lateral lean. Pt able to correct with VCs and tactile cues toward end. Static Standing Balance Static Standing - Balance Support: Bilateral upper extremity supported Static Standing - Level of Assistance: 1: +2 Total assist Static Standing - Comment/# of Minutes: (A) to promote midline and prevent posterior and right sided lean.  Pt relies heavily with LE against bed to maintain balance.  Dynamic Standing Balance Dynamic Standing - Balance Support: Bilateral upper extremity supported Dynamic Standing - Level of Assistance: 1: +2 Total assist;Patient percentage (comment) (50%) Dynamic Standing - Balance Activities: Lateral lean/weight shifting (standing march)  End of Session PT - End of Session Equipment Utilized During Treatment: Gait belt Activity Tolerance: Patient tolerated treatment well Patient left: in bed;with call bell/phone within reach;with family/visitor present;with  bed alarm set Nurse Communication: Mobility status  GP     Arantza Darrington 08/01/2013, 3:31 PM  Jake Shark, PT DPT [phone]

## 2013-08-01 NOTE — Progress Notes (Signed)
Order for BSE/MBS received, given brainstem CVA, plan to proceed with MBS.  Informed RN this am.   Donavan Burnet, MS [facility] SLP [phone]

## 2013-08-01 NOTE — Progress Notes (Signed)
Family Medicine Teaching Service Interim Progress Note  Paged that patient wanted to talk to an MD. I went up to speak to patient and his sister and updated them on patient's hospital course. He was complaining of pain in his neck and wanting something to help him go to sleep.   Patient given Morphine 2mg  IV q4hours for pain.

## 2013-08-01 NOTE — Evaluation (Signed)
Occupational Therapy Evaluation Patient Details Name: Keith [name] MRN: [id number] DOB: [birth year] Today's Date: 08/01/2013 Time: 7829-5621 OT Time Calculation (min): 26 min  OT Assessment / Plan / Recommendation History of present illness Keith [name] is a 67 y.o. male presenting with dysarthria, left-sided facial droop, and left-sided UE & LE weakness. PMH is significant for DM, HTN, previous smoker. MRI finds:  right paracentral pons; left paracentral medulla   Clinical Impression   Pt admitted with above. Will continue to follow pt acutely in order to address below problem list. Recommending CIR to further progress rehab and maximize independence with ADLs.     OT Assessment  Patient needs continued OT Services    Follow Up Recommendations  CIR    Barriers to Discharge Decreased caregiver support pt lives alone  Equipment Recommendations  3 in 1 bedside comode    Recommendations for Other Services Rehab consult  Frequency  Min 3X/week    Precautions / Restrictions Precautions Precautions: Fall   Pertinent Vitals/Pain See vitals    ADL  Upper Body Bathing: Simulated;Moderate assistance Where Assessed - Upper Body Bathing: Supported sitting Lower Body Bathing: Simulated;+2 Total assistance Lower Body Bathing: Patient Percentage: 50% Where Assessed - Lower Body Bathing: Supported sit to stand Upper Body Dressing: Simulated;Maximal assistance Where Assessed - Upper Body Dressing: Supported sitting Lower Body Dressing: Performed;+2 Total assistance Lower Body Dressing: Patient Percentage: 50% Where Assessed - Lower Body Dressing: Supported sit to Pharmacist, hospital: Chief of Staff: Patient Percentage: 50% Statistician Method: Sit to Barista:  (bed) Equipment Used: Gait belt (oxygen) Transfers/Ambulation Related to ADLs: +2 total assist for sit<>stand and to take side steps toward HOB. Assist for balance due to  heavy right lateral lean. ADL Comments: Slow to process commands/questions, unsure if this is due to medication or communication deficits.    OT Diagnosis: Generalized weakness;Cognitive deficits;Disturbance of vision  OT Problem List: Decreased strength;Decreased activity tolerance;Impaired balance (sitting and/or standing);Decreased knowledge of use of DME or AE;Impaired vision/perception;Decreased cognition OT Treatment Interventions: Self-care/ADL training;Neuromuscular education;DME and/or AE instruction;Therapeutic activities;Patient/family education;Balance training;Cognitive remediation/compensation;Visual/perceptual remediation/compensation   OT Goals(Current goals can be found in the care plan section) Acute Rehab OT Goals Patient Stated Goal: did not set OT Goal Formulation: With patient/family Time For Goal Achievement: 08/15/13 Potential to Achieve Goals: Good  Visit Information  Last OT Received On: 08/01/13 Assistance Needed: +2 History of Present Illness: Keith [name] is a 67 y.o. male presenting with dysarthria, left-sided facial droop, and left-sided UE & LE weakness. PMH is significant for DM, HTN, previous smoker. MRI finds:  right paracentral pons; left paracentral medulla       Prior Functioning     Home Living Family/patient expects to be discharged to:: Private residence Living Arrangements: Alone Available Help at Discharge: Family Type of Home: House Home Access: Stairs to enter Secretary/administrator of Steps: 6 Entrance Stairs-Rails: Right;Left Prior Function Level of Independence: Independent Communication Communication: HOH (slurred speech)         Vision/Perception Vision - Assessment Vision Assessment: Vision impaired - to be further tested in functional context Additional Comments: Pt reports he is seeing things crawl and move all around him (for example, on the walls, in his bed). Unable to perform formal cision assessment due to pt becoming  very lethargic by end of session.   Cognition  Cognition Arousal/Alertness: Awake/alert Behavior During Therapy: Flat affect Overall Cognitive Status: Impaired/Different from baseline Area of Impairment: Awareness;Problem solving;Attention Current Attention Level: Selective  Awareness: Emergent Problem Solving: Slow processing;Difficulty sequencing;Requires verbal cues General Comments: Per RN pt hallucinating of black things crawling on him.  Pt restless prior to PT evaluation    Extremity/Trunk Assessment Upper Extremity Assessment Upper Extremity Assessment: RUE deficits/detail RUE Deficits / Details: 4/5 throughout Lower Extremity Assessment Lower Extremity Assessment: RLE deficits/detail RLE Deficits / Details: 4/5 gross     Mobility Bed Mobility Bed Mobility: Supine to Sit;Sitting - Scoot to Edge of Bed Supine to Sit: With rails;4: Min assist Sitting - Scoot to [place] of Bed: 4: Min assist Sit to Supine: 4: Min assist Details for Bed Mobility Assistance: (A) for overall balance during supine > sit Transfers Transfers: Sit to Stand;Stand to Sit Sit to Stand: 1: +2 Total assist;From bed Sit to Stand: Patient Percentage: 50% Stand to Sit: 1: +2 Total assist;To bed Stand to Sit: Patient Percentage: 50% Details for Transfer Assistance: +2 (A) to initiate transfer and slowly descend with cues for hand placement.  Cues for midline and forward translation during transfers.      Exercise     Balance Balance Balance Assessed: Yes Static Sitting Balance Static Sitting - Balance Support: Feet supported;Bilateral upper extremity supported Static Sitting - Level of Assistance: 4: Min assist;5: Stand by assistance Static Sitting - Comment/# of Minutes: Assist to maintain balance in midline and to prevent right lateral lean. Pt able to correct with VCs and tactile cues toward end. Static Standing Balance Static Standing - Balance Support: Bilateral upper extremity supported Static  Standing - Level of Assistance: 1: +2 Total assist Static Standing - Comment/# of Minutes: (A) to promote midline and prevent posterior and right sided lean.  Pt relies heavily with LE against bed to maintain balance.  Dynamic Standing Balance Dynamic Standing - Balance Support: Bilateral upper extremity supported Dynamic Standing - Level of Assistance: 1: +2 Total assist;Patient percentage (comment) (50%) Dynamic Standing - Balance Activities: Lateral lean/weight shifting (standing march)   End of Session OT - End of Session Equipment Utilized During Treatment: Gait belt Activity Tolerance: Patient limited by lethargy;Patient limited by fatigue Patient left: in bed;with call bell/phone within reach;with bed alarm set Nurse Communication: Mobility status  GO    08/01/2013 Cipriano Mile OTR/L Pager [phone] Office [phone]  Cipriano Mile 08/01/2013, 2:56 PM

## 2013-08-02 DIAGNOSIS — E871 Hypo-osmolality and hyponatremia: Secondary | ICD-10-CM

## 2013-08-02 LAB — GLUCOSE, CAPILLARY
Glucose-Capillary: 106 mg/dL — ABNORMAL HIGH (ref 70–99)
Glucose-Capillary: 114 mg/dL — ABNORMAL HIGH (ref 70–99)
Glucose-Capillary: 116 mg/dL — ABNORMAL HIGH (ref 70–99)
Glucose-Capillary: 124 mg/dL — ABNORMAL HIGH (ref 70–99)

## 2013-08-02 MED ORDER — LORAZEPAM 2 MG/ML IJ SOLN
1.0000 mg | Freq: Once | INTRAMUSCULAR | Status: AC
  Administered 2013-08-02: 1 mg via INTRAVENOUS
  Filled 2013-08-02: qty 1

## 2013-08-02 MED ORDER — LORAZEPAM 2 MG/ML IJ SOLN
1.0000 mg | Freq: Four times a day (QID) | INTRAMUSCULAR | Status: DC | PRN
Start: 2013-08-02 — End: 2013-08-02

## 2013-08-02 MED ORDER — LORAZEPAM 1 MG PO TABS: 1.0000 mg | ORAL_TABLET | Freq: Four times a day (QID) | ORAL | Status: DC | PRN

## 2013-08-02 MED ORDER — LORAZEPAM 2 MG/ML IJ SOLN
0.5000 mg | INTRAMUSCULAR | Status: DC | PRN
  Administered 2013-08-02 – 2013-08-05 (×5): 0.5 mg via INTRAVENOUS
  Filled 2013-08-02 (×5): qty 1

## 2013-08-02 NOTE — Progress Notes (Signed)
Pt having difficulty emptying bladder. Pt reported the urge to urinate but pt's urinary output only 50cc. Pt reported feeling like he still had to urinate but was unable to.  Bladder scanned reviled 380 cc of residual urine.  Md notified and an In-and-out catheter was done. 400 cc of urine drained from bladder. Pt due to void at 0630. Will continue to monitor.

## 2013-08-02 NOTE — Progress Notes (Signed)
Speech Language Pathology Treatment: Dysphagia  Patient Details Name: Keith [name] MRN: [id number] DOB: [birth year] Today's Date: 08/02/2013 Time: 9528-4132 SLP Time Calculation (min): 30 min  Assessment / Plan / Recommendation Clinical Impression  SLP saw pt for skilled swallow treatment to facilitate swallow recovery.  Pt still npo, does not have alternative means of nutrition at this time.  Pt's sister Delray Alt present during session and both pt and Delray Alt were educated to results of MBS, indications for temporary alternative means of nutrition.  Also reviewed importance of oral care and use of toothette to maintain moisture/aid pt eliciting swallows to facilitate swallow recovery.  Pt with anterior labial spillage of secretions retained with + sensation - wiping away secretions with washcloth.    Provided pt with small ice chips, moisture via toothette to elicit swallow - swallow was delayed with decreased laryngeal elevation but no overt s/s of aspiration.  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 H/o viral issue.    Rec continue NPO except oral moisture via toothette and continue aggressive SLP treatment.  SLP is hopeful pt's swallow will improve within the next 7-10 days to allow some type of po diet.     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  MBS ordered due to concerns for dysphagia.  Pt admits to h/o "sore throat" and transient problems swallowing in the last few weeks.  Pt is a previous smoker, CXR negative 11/29.  Pt seen for speech language evaluation and swallow treatment.    Pertinent Vitals Afebrile, decreased  SLP Plan  Continue with current plan of care    Recommendations Diet recommendations:  NPO Liquids provided via:  (except oral moisture via toothette) Medication Administration: Via alternative means              General recommendations: Rehab consult Oral Care Recommendations: Oral care Q4 per protocol Follow up Recommendations: Inpatient Rehab Plan: Continue with current plan of care    GO     Donavan Burnet, MS [facility] SLP [phone]

## 2013-08-02 NOTE — Progress Notes (Signed)
FMTS Attending Daily Note:  Renold Don MD  [phone] pager  Family Practice pager:  [phone] I have seen and examined this patient and have reviewed their chart. I have discussed this patient with the resident. I agree with the resident's findings, assessment and care plan.  Additionally:  - Still with some profound Left sided weakness and dysarthria.   - Sister present with him today, states he had about 3 weeks of malaise and headache leading up to this episode.  Has been evaluated at PCP's and 2 separate ED (outside our system) for the same.   - No tick bites or other potential infectious exposures.  Will obtain RPR, though likely very low yield.  - Cryptogenic stroke -- can consider outpatient Holter to screen for atrial fib  Tobey Grim, MD 08/02/2013 3:34 PM

## 2013-08-02 NOTE — Evaluation (Signed)
Speech Language Pathology Evaluation Patient Details Name: Keith [name] MRN: [id number] DOB: 02-16-1946 Today's Date: 08/02/2013 Time: 4098-1191 SLP Time Calculation (min): 20 min  Problem List:  Patient Active Problem List   Diagnosis Date Noted  . Facial droop 07/31/2013  . CVA (cerebral infarction) 07/31/2013   Past Medical History:  Past Medical History  Diagnosis Date  . Diabetes mellitus without complication   . Hypertension    Past Surgical History:  Past Surgical History  Procedure Laterality Date  . Prostate surgery    . Knee surgery     HPI:      Assessment / Plan / Recommendation Clinical Impression   Pt presents with severe flaccid dysarthria impairing pt's ability to articulate/communicate his needs.  He was oriented to place and situation but not to date.  Question mild delay in processing information and pt with hearing loss.  Evaluation was impacted by pt's emotions resulting in decreased attention/focus.  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  SLP also questions if pt has some left inattention or visual deficits.    Pt would benefit from skilled SLP to maximize his speech/swallow function to decrease caregiver burden.  Skilled intervention included educating pt to use of communication board, reinforcement of its use to facilitate communication and reinforcement of effective dysarthria strategies - slowing rate and over-articulating specifically.  Teach back method used for education with pt and sister Keith [name] was present during eval.      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   Cognition  Orientation Level: Oriented to person;Oriented to place;Disoriented to time Attention: Sustained Sustained Attention: Impaired Sustained Attention Impairment: Verbal basic (pt internally distracted/concerns re: business and his impairments impacting his attention) Memory: Impaired Awareness: Appears intact (aware to physical/speech/swallow deficits) Problem Solving: Appears intact (pt able to use communication board - showing problem solving\)    Comprehension  Auditory Comprehension Yes/No Questions: Within Functional Limits Commands: Within Functional Limits Conversation: Complex (re: work situation and health) Interfering Components: Anxiety Visual Recognition/Discrimination Discrimination: Not tested Reading Comprehension Reading Status:  (pt able to read words on his communication board- uses reading glasses, ? mild decreased attention to left)    Expression Expression Primary Mode of Expression: Verbal Verbal Expression Overall Verbal Expression: Other (comment) (TBD) Initiation: No impairment Level of Generative/Spontaneous Verbalization: Phrase (difficult to assess at higher level due to level of dysarthria) Naming: Not tested Pragmatics: Impairment Impairments: Abnormal affect;Interpretation of nonverbal communication;Monotone (pt demonstrating sadness that is likely impacting test results/pragmatics) Interfering Components: Speech intelligibility Non-Verbal Means of Communication: Gestures;Communication board Other Verbal Expression Comments: pt  demonstrated appropriate communication board use with SLP providing situations  Written Expression Written Expression: Not tested   Oral / Motor Oral Motor/Sensory Function Overall Oral Motor/Sensory Function: Impaired Labial ROM: Reduced  left Labial Symmetry: Abnormal symmetry left Labial Strength: Reduced Labial Sensation: Reduced Lingual ROM: Reduced left Lingual Symmetry: Abnormal symmetry left Lingual Strength: Reduced Lingual Sensation: Reduced Facial ROM: Reduced left Facial Symmetry: Left droop;Left drooping eyelid Facial Strength: Reduced Facial Sensation: Reduced Velum: Impaired left;Impaired right Mandible: Impaired Motor Speech Overall Motor Speech: Impaired Respiration: Impaired Level of Impairment: Phrase Phonation: Breathy;Low vocal intensity Articulation: Impaired Level of Impairment: Word Intelligibility: Intelligibility reduced Word: 25-49% accurate Phrase: 25-49% accurate Sentence: 25-49% accurate Conversation: Not tested Motor Planning: Witnin functional limits Interfering Components: Hearing loss Effective Techniques: Slow rate;Over-articulate   GO     Keith Burnet, MS [facility] SLP [phone]

## 2013-08-02 NOTE — Progress Notes (Signed)
Physical Therapy Treatment Patient Details Name: Keith [name] MRN: [id number] DOB: [birth year] Today's Date: 08/02/2013 Time: 8295-6213 PT Time Calculation (min): 26 min  PT Assessment / Plan / Recommendation  History of Present Illness Keith [name] is a 67 y.o. male presenting with dysarthria, left-sided facial droop, and left-sided UE & LE weakness. PMH is significant for DM, HTN, previous smoker. MRI finds:  right paracentral pons; left paracentral medulla   PT Comments   Patient progressing with mobility today able to ambulate in hallway with focus on midline orientation and cues for safety with turns and transfers.  Patient seems eager to move along towards rehab, but is slow to process and needs multiple cues.  Follow Up Recommendations  CIR     Does the patient have the potential to tolerate intense rehabilitation   Yes  Barriers to Discharge  None      Equipment Recommendations  Rolling walker with 5" wheels    Recommendations for Other Services  None  Frequency Min 4X/week   Progress towards PT Goals Progress towards PT goals: Progressing toward goals  Plan Current plan remains appropriate    Precautions / Restrictions Precautions Precautions: Fall Restrictions Weight Bearing Restrictions: No   Pertinent Vitals/Pain No pain complaints    Mobility  Bed Mobility Sit to Supine: 4: Min assist Details for Bed Mobility Assistance: assist for legs into bed Transfers Sit to Stand: From chair/3-in-1;1: +2 Total assist;With armrests Sit to Stand: Patient Percentage: 60% Stand to Sit: To chair/3-in-1;To bed;1: +2 Total assist Stand to Sit: Patient Percentage: 70% Details for Transfer Assistance: facilitated midline moving head into midline with sit to stand; needs instructive cues initially to push from armrests, then questioning cues on subsequent trials Ambulation/Gait Ambulation/Gait Assistance: 1: +2 Total assist Ambulation/Gait: Patient Percentage: 60% Ambulation  Distance (Feet): 100 Feet (x 2) Assistive device: Rolling walker Ambulation/Gait Assistance Details: facilitation to keep head and shoulders in midline and cues for head up eyes ahead; mulitple stops to fix upright posture due to continues to lean to right Gait Pattern: Step-through pattern;Trunk flexed;Lateral trunk lean to right;Decreased stride length;Trunk rotated posteriorly on right;Decreased stance time - right Modified Rankin (Stroke Patients Only) Pre-Morbid Rankin Score: No symptoms Modified Rankin: Moderately severe disability      PT Goals (current goals can now be found in the care plan section)    Visit Information  Last PT Received On: 08/02/13 Assistance Needed: +2 History of Present Illness: Keith [name] is a 67 y.o. male presenting with dysarthria, left-sided facial droop, and left-sided UE & LE weakness. PMH is significant for DM, HTN, previous smoker. MRI finds:  right paracentral pons; left paracentral medulla    Subjective Data   Let's get this going   Cognition  Cognition Arousal/Alertness: Awake/alert Behavior During Therapy: Flat affect Overall Cognitive Status: Impaired/Different from baseline Area of Impairment: Awareness;Problem solving;Attention Current Attention Level: Sustained Awareness: Emergent Problem Solving: Slow processing;Difficulty sequencing;Requires verbal cues General Comments: Patient eager to walk today, but slow to process commands and very limited verbal interaction    Balance  Static Sitting Balance Static Sitting - Balance Support: Feet supported Static Sitting - Level of Assistance: 5: Stand by assistance Static Sitting - Comment/# of Minutes: cues for midline positioning sitting edge of chair without UE support Static Standing Balance Static Standing - Balance Support: Bilateral upper extremity supported Static Standing - Level of Assistance: 3: Mod assist  End of Session PT - End of Session Equipment Utilized During Treatment:  Gait belt Activity Tolerance:  Patient tolerated treatment well Patient left: in bed;with call bell/phone within reach;with family/visitor present Nurse Communication: Mobility status   GP     [facility] 08/02/2013, 3:56 PM Sheran Lawless, PT [phone] 08/02/2013

## 2013-08-02 NOTE — Progress Notes (Signed)
Family Medicine Teaching Service Daily Progress Note Intern Pager: [phone]  Patient name: Keith [name] Medical record number: [id number] Date of birth: June 04, 1946 Age: 67 y.o. Gender: male  Primary Care Provider: Josue Hector, MD Consultants: None Code Status: Full Code  Pt Overview and Major Events to Date:   Assessment and Plan: Keith [name] is a 67 y.o. male presenting with dysarthria, left-sided facial droop, and left-sided UE & LE weakness. PMH is significant for DM, HTN, previous smoker.   # Dysarthria and Facial, UE, LE weakness secondary to Stroke  Has been on ASA. CT shows atherosclerotic and physiologic intracranial calcifications w/o signs of acute stroke or hemorrhage. EKG showed NSR. I-stat Troponin neg in ED. CBC&CMET wnl. Lipid panel unremarkable.  - MRI: revealed two small acute brainstem infarcts: right paracentral pons, central to left paracentral medulla, possibly involving the left pyramid.  - Has microhemorrhage but neuro says this is likely sequela of hypertension and does not change management - Carotid doppler pending - A1C 7.4, TSH 1.188 - Echo: grade 1 dyastolic dysfunction, no thrombus - ASA daily; Will initiate Plavix once able to take PO  - Will start statin when able to take PO  - follow-up PT/OT eval and treat  - Morphine given in ED for HA and anxiety about MRI  - UDS positive for benzos and opiates (both given here) - f/u speech recs for dysphagia  # DM  - A1c 7.4 - Holding Metformin while NPO  - CBGs Q 4 hours; No insulin at this time as patient is NPO   #HTN   - Holding Cozaar 100mg  qd & Norvasc 5mg  qd  - Will restart if needed or at discharge   # Alcohol Abuse  - Reports drinking 4-5 beers daily; last drink ~ 1 wk ago; Ethanol level neg on admission  - Discontinue CIWA protocol   # Anxiety/depression: 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 He reports this is "worse than death" He is still having generalized weakness which he says has not changed.   Objective: Temp:  [97.5 F (36.4 C)-98.3 F (36.8 C)] 98.3 F (36.8 C) (12/01 0503) Pulse Rate:  [66-77] 66 (12/01 0503) Resp:  [18-20] 18 (12/01 0503) BP: (113-141)/(62-80) 113/68 mmHg (12/01 0503) SpO2:  [99 %-100 %] 100 % (12/01 0503)  Physical Exam: General: elderly male, sitting in chair in no acute distress  Cardiovascular: Regular rate/rhythm Respiratory: Clear to auscultation bilaterally, no wheezes Abdomen: Soft, non-tender, non-distended Extremities: No edema Neuro: Alert and oriented x3. Left sided facial droop, CN 3-8 intact. Left CN 9/10 deficit with +Right uvula deviation. Left CN11 deficit with shoulder shrug deficit. R side strength normal, LLE strength 4/5, LUE strength 3/5, dysarthria present Psych: reports anxiety and appears depressed  Laboratory:  Recent Labs Lab 07/31/13 1415  WBC 7.7  HGB 15.5  HCT 44.3  PLT 257    Recent Labs Lab 07/31/13 1415  NA 134*  K 5.1  CL 94*  CO2 29  BUN 20  CREATININE 0.93  CALCIUM 10.3  PROT 7.4  BILITOT 0.5  ALKPHOS 59  ALT 53  AST 33  GLUCOSE 157*   Lipid Panel     Component Value Date/Time   CHOL 129 08/01/2013 0624   TRIG 92 08/01/2013 0624   HDL 53 08/01/2013 0624   CHOLHDL 2.4 08/01/2013 0624   VLDL 18 08/01/2013 0624   LDLCALC 58 08/01/2013 0624    Imaging/Diagnostic Tests: Echo:  Left ventricle: The cavity size was normal. Wall thickness was normal. Systolic function was normal. The estimated ejection fraction was in the range of 55% to 60%. Wall  motion was normal; there were no regional wall motion abnormalities. Doppler parameters are consistent with abnormal left ventricular relaxation (grade 1 diastolic dysfunction). Pulmonary arteries: PA peak pressure: 33mm Hg (S).  Beverely Low, MD 08/02/2013, 6:56 AM PGY-1, [facility] Family Medicine FPTS Intern pager: [phone], text pages welcome

## 2013-08-02 NOTE — Consult Note (Signed)
Physical Medicine and Rehabilitation Consult  Reason for Consult: left sided weakness, left facial droop Referring Physician:  Dr. Richarda Blade   HPI: Keith [name] is a 67 y.o. male with history of DM, HTN, who was admitted on 07/31/13 with left facial droop with dysarthria and one week history of imbalance as diplopia X 2 days.  MRI/MRA brain done revealing two small acute brainstem infarcts in-right paracentral pons, and central to left paracentral medulla possibly involving left pyramid, no stenosis. 2D echo with EF 55-60% and grade 1 diastolic dysfunction. Carotid dopplers with 1-39% ICA stenosis. Plavix recommended for secondary stroke prevention once able to take po's. MBS done 08/01/13 alternative nutritional means   and NPO recommended. He continues with balance deficits, visual deficits,  with severe dysphagia and delayed processing. MD, PT, OT, ST recommending CIR.     Review of Systems  Constitutional: Positive for malaise/fatigue.  Eyes:       Seeing black things. Double vision.   Musculoskeletal: Positive for neck pain.  Neurological: Positive for weakness and headaches (past 2-3 weeks. ).   Past Medical History  Diagnosis Date  . Diabetes mellitus without complication   . Hypertension    Past Surgical History  Procedure Laterality Date  . Prostate surgery    . Knee surgery     No family history on file.  Social History: Lives alone.  reports that he has quit smoking. He does not have any smokeless tobacco history on file. He reports that he drinks alcohol. He reports that he does not use illicit drugs.  Allergies: No Known Allergies  Medications Prior to Admission  Medication Sig Dispense Refill  . acetaminophen (TYLENOL) 500 MG tablet Take 1,000 mg by mouth every 6 (six) hours as needed.      Marland Kitchen amLODipine (NORVASC) 5 MG tablet Take 5 mg by mouth daily.      . fluticasone (FLONASE) 50 MCG/ACT nasal spray Place 2 sprays into both nostrils 2 (two) times daily.      Marland Kitchen  HYDROcodone-acetaminophen (NORCO/VICODIN) 5-325 MG per tablet Take 1 tablet by mouth every 6 (six) hours as needed for moderate pain.      Marland Kitchen ibuprofen (ADVIL,MOTRIN) 600 MG tablet Take 600 mg by mouth every 8 (eight) hours as needed for moderate pain.      Marland Kitchen losartan (COZAAR) 100 MG tablet Take 100 mg by mouth daily.      . metFORMIN (GLUCOPHAGE) 500 MG tablet Take 500 mg by mouth 2 (two) times daily with a meal.        Home: Home Living Family/patient expects to be discharged to:: Private residence Living Arrangements: Alone Available Help at Discharge: Family Type of Home: House Home Access: Stairs to enter Secretary/administrator of Steps: 6 Entrance Stairs-Rails: Right;Left  Functional History:   Functional Status:  Mobility: Bed Mobility Bed Mobility: Supine to Sit;Sitting - Scoot to Edge of Bed Supine to Sit: With rails;4: Min assist Sitting - Scoot to [place] of Bed: 4: Min assist Sit to Supine: 4: Min assist Transfers Transfers: Sit to Stand;Stand to Sit Sit to Stand: 1: +2 Total assist;From bed Sit to Stand: Patient Percentage: 50% Stand to Sit: 1: +2 Total assist;To bed Stand to Sit: Patient Percentage: 50% Ambulation/Gait Ambulation/Gait Assistance: 1: +2 Total assist Ambulation/Gait: Patient Percentage: 50% Ambulation Distance (Feet): 4 Feet (side steps to [facility]) Assistive device: 2 person hand held assist Ambulation/Gait Assistance Details: (A) to maintain balance with cues for proper weight shift to advance LEs.  Gait Pattern:  Step-to pattern;Decreased stance time - right;Shuffle;Lateral trunk lean to right Gait velocity: decreased Stairs: No    ADL: ADL Upper Body Bathing: Simulated;Moderate assistance Where Assessed - Upper Body Bathing: Supported sitting Lower Body Bathing: Simulated;+2 Total assistance Where Assessed - Lower Body Bathing: Supported sit to stand Upper Body Dressing: Simulated;Maximal assistance Where Assessed - Upper Body Dressing: Supported  sitting Lower Body Dressing: Performed;+2 Total assistance Where Assessed - Lower Body Dressing: Supported sit to Pharmacist, hospital: Chief of Staff Method: Sit to Barista:  (bed) Equipment Used: Gait belt (oxygen) Transfers/Ambulation Related to ADLs: +2 total assist for sit<>stand and to take side steps toward HOB. Assist for balance due to heavy right lateral lean. ADL Comments: Slow to process commands/questions, unsure if this is due to medication or communication deficits.  Cognition: Cognition Overall Cognitive Status: Impaired/Different from baseline Orientation Level: Oriented X4 Cognition Arousal/Alertness: Awake/alert Behavior During Therapy: Flat affect Overall Cognitive Status: Impaired/Different from baseline Area of Impairment: Awareness;Problem solving;Attention Current Attention Level: Selective Awareness: Emergent Problem Solving: Slow processing;Difficulty sequencing;Requires verbal cues General Comments: Per RN pt hallucinating of black things crawling on him.  Pt restless prior to PT evaluation  Blood pressure 138/72, pulse 70, temperature 97.3 F (36.3 C), temperature source Oral, resp. rate 18, height 6\' 1"  (1.854 m), weight 77.293 kg (170 lb 6.4 oz), SpO2 100.00%. Physical Exam  Nursing note and vitals reviewed. Constitutional: He is oriented to person, place, and time. He appears well-developed and well-nourished.  HENT:  Head: Normocephalic and atraumatic.  Eyes: Conjunctivae are normal. Pupils are equal, round, and reactive to light.  Neck: Normal range of motion. Neck supple.  Cardiovascular: Normal rate and regular rhythm.   Respiratory: Effort normal and breath sounds normal. No respiratory distress. He has no wheezes.  GI: Soft. Bowel sounds are normal.  Musculoskeletal: He exhibits no edema.  Neurological: He is alert and oriented to person, place, and time.  Dysarthric speech with  occasional wet voice. Follows commands without difficulty. Poor insight with impaired awareness of deficits. Hard to arouse. Minimal initiation of movement in the LUE. Better with LLE but inconsistent. Moves right side grossly 3-4/5. Senses pain on left. Left facial droop. Speech very dysarthric. Tends to look to the right    Skin: Skin is warm and dry.    Results for orders placed during the hospital encounter of 07/31/13 (from the past 24 hour(s))  GLUCOSE, CAPILLARY     Status: Abnormal   Collection Time    08/01/13 11:46 AM      Result Value Range   Glucose-Capillary 133 (*) 70 - 99 mg/dL  GLUCOSE, CAPILLARY     Status: Abnormal   Collection Time    08/01/13  4:01 PM      Result Value Range   Glucose-Capillary 130 (*) 70 - 99 mg/dL  GLUCOSE, CAPILLARY     Status: Abnormal   Collection Time    08/01/13  7:58 PM      Result Value Range   Glucose-Capillary 110 (*) 70 - 99 mg/dL   Comment 1 Notify RN     Comment 2 Documented in Chart    GLUCOSE, CAPILLARY     Status: Abnormal   Collection Time    08/01/13 11:43 PM      Result Value Range   Glucose-Capillary 101 (*) 70 - 99 mg/dL   Comment 1 Notify RN     Comment 2 Documented in Chart    GLUCOSE, CAPILLARY  Status: Abnormal   Collection Time    08/02/13  4:18 AM      Result Value Range   Glucose-Capillary 106 (*) 70 - 99 mg/dL   Comment 1 Documented in Chart     Comment 2 Notify RN    GLUCOSE, CAPILLARY     Status: Abnormal   Collection Time    08/02/13  8:09 AM      Result Value Range   Glucose-Capillary 114 (*) 70 - 99 mg/dL   Comment 1 Documented in Chart     Dg Chest 2 View  07/31/2013   CLINICAL DATA:  Stroke  EXAM: CHEST  2 VIEW  COMPARISON:  07/31/2013  FINDINGS: Low lung volumes with bibasilar atelectasis right greater than left. Normal heart size. No pneumothorax. Normal vascularity.  IMPRESSION: Bibasilar atelectasis.   Electronically Signed   By: Maryclare Bean M.D.   On: 07/31/2013 21:36   Ct Head Wo  Contrast  07/31/2013   CLINICAL DATA:  Left-sided weakness, facial droop.  EXAM: CT HEAD WITHOUT CONTRAST  TECHNIQUE: Contiguous axial images were obtained from the base of the skull through the vertex without intravenous contrast.  COMPARISON:  07/30/2013  FINDINGS: Atherosclerotic and physiologic intracranial calcifications. Mild atrophy. There is no evidence of acute intracranial hemorrhage, brain edema, mass lesion, acute infarction, mass effect, or midline shift. Acute infarct may be inapparent on noncontrast CT. No other intra-axial abnormalities are seen, and the ventricles and sulci are within normal limits in size and symmetry. No abnormal extra-axial fluid collections or masses are identified. No significant calvarial abnormality.  IMPRESSION: No bleed or other acute intracranial process.   Electronically Signed   By: Oley Balm M.D.   On: 07/31/2013 15:03   Mr Brain Wo Contrast  07/31/2013   CLINICAL DATA:  67 year old male with very slurred speech and loss of balance. Progressive symptoms. Initial encounter.  EXAM: MRI HEAD WITHOUT CONTRAST  MRA HEAD WITHOUT CONTRAST  TECHNIQUE: Multiplanar, multiecho pulse sequences of the brain and surrounding structures were obtained without intravenous contrast. Angiographic images of the head were obtained using MRA technique without contrast.  COMPARISON:  Head CTs without contrast 07/30/2013 and earlier.  FINDINGS: MRI HEAD FINDINGS  Linear areas of restricted diffusion in the right paracentral pons (series 3, image 10) and also in the central and left paracentral medulla (image 5), probably involving the lower aspect of the left medullary pyramid. No cerebellar restricted diffusion identified. No supratentorial restricted diffusion. Major intracranial vascular flow voids are preserved.  Subtle T2 and FLAIR hyperintensity in the brainstem corresponding to the acute findings. No associated hemorrhage or mass effect. Small chronic micro hemorrhage in the  posterior lateral left thalamus. Mild and indistinct cerebral white matter T2 and FLAIR hyperintensity. No chronic encephalomalacia identified.  No midline shift, mass effect, evidence of mass lesion, ventriculomegaly, extra-axial collection or acute intracranial hemorrhage. Cervicomedullary junction and pituitary are within normal limits. Negative visualized cervical spine. Normal bone marrow signal.  Visualized orbit soft tissues are within normal limits. Visualized paranasal sinuses and mastoids are clear. Normal visualized internal auditory structures. Visualized scalp soft tissues are within normal limits.  MRA HEAD FINDINGS  Antegrade flow in the posterior circulation. Codominant distal vertebral arteries are patent. Patent right PICA origin. Left PICA not identified, but dominant appearing left AICA origin is patent. Tortuous but otherwise negative vertebrobasilar junction. No basilar stenosis. Normal SCA and PCA origins. Posterior communicating arteries are diminutive or absent. Bilateral PCA branches are within normal limits.  Antegrade  flow in both ICA siphons. No ICA stenosis. Ophthalmic artery origins are within normal limits. Normal carotid termini, MCA and ACA origins. Normal anterior communicating artery and visualized ACA branches. Normal visualized bilateral MCA branches.  IMPRESSION: 1. Two small acute brainstem infarcts: -right paracentral pons, - central to left paracentral medulla, possibly involving the left pyramid. 2. No associated mass effect or hemorrhage. 3. Negative intracranial MRA, with patent posterior circulation.   Electronically Signed   By: Augusto Gamble M.D.   On: 07/31/2013 18:12   Dg Chest Portable 1 View  07/31/2013   CLINICAL DATA:  Diabetes.  Facial droop.  EXAM: PORTABLE CHEST - 1 VIEW  COMPARISON:  None.  FINDINGS: The heart size and mediastinal contours are within normal limits. Both lungs are clear. The visualized skeletal structures are unremarkable.  IMPRESSION: No  active disease.   Electronically Signed   By: Maisie Fus  Register   On: 07/31/2013 15:32    Assessment/Plan: Diagnosis: left medullary, right paracentral pontine infarcts 1. Does the need for close, 24 hr/day medical supervision in concert with the patient's rehab needs make it unreasonable for this patient to be served in a less intensive setting? Yes and Potentially 2. Co-Morbidities requiring supervision/potential complications: see above, post-stroke sequelae 3. Due to bladder management, bowel management, safety, skin/wound care, disease management, medication administration, pain management and patient education, does the patient require 24 hr/day rehab nursing? Yes 4. Does the patient require coordinated care of a physician, rehab nurse, PT (1-2 hrs/day, 5 days/week), OT (1-2 hrs/day, 5 days/week) and SLP (1-2 hrs/day, 5 days/week) to address physical and functional deficits in the context of the above medical diagnosis(es)? Yes Addressing deficits in the following areas: balance, endurance, locomotion, strength, transferring, bowel/bladder control, bathing, dressing, feeding, grooming, toileting, cognition, speech, language, swallowing and psychosocial support 5. Can the patient actively participate in an intensive therapy program of at least 3 hrs of therapy per day at least 5 days per week? Yes 6. The potential for patient to make measurable gains while on inpatient rehab is excellent 7. Anticipated functional outcomes upon discharge from inpatient rehab are supervision to min assist with PT, supervision to min assist with OT, supervision to min assist with SLP. 8. Estimated rehab length of stay to reach the above functional goals is: 20-25 days 9. Does the patient have adequate social supports to accommodate these discharge functional goals? Potentially 10. Anticipated D/C setting: Home 11. Anticipated post D/C treatments: HH therapy 12. Overall Rehab/Functional Prognosis:  good  RECOMMENDATIONS: This patient's condition is appropriate for continued rehabilitative care in the following setting: CIR Patient has agreed to participate in recommended program. Potentially Note that insurance prior authorization may be required for reimbursement for recommended care.  Comment: Need to follow up on social supports. Rehab RN to follow up.   Ranelle Oyster, MD, [place] [facility]     08/02/2013

## 2013-08-02 NOTE — Progress Notes (Signed)
Rehab Admissions Coordinator Note:  Patient was screened by Keith [name] for appropriateness for an Inpatient Acute Rehab Consult.  At this time, we are recommending Inpatient Rehab consult.  Keith [name] 08/02/2013, 9:04 AM  I can be reached at [phone].

## 2013-08-03 ENCOUNTER — Inpatient Hospital Stay (HOSPITAL_COMMUNITY): Payer: Medicare Other

## 2013-08-03 DIAGNOSIS — I6330 Cerebral infarction due to thrombosis of unspecified cerebral artery: Secondary | ICD-10-CM

## 2013-08-03 LAB — RPR: RPR Ser Ql: NONREACTIVE

## 2013-08-03 LAB — GLUCOSE, CAPILLARY
Glucose-Capillary: 107 mg/dL — ABNORMAL HIGH (ref 70–99)
Glucose-Capillary: 105 mg/dL — ABNORMAL HIGH (ref 70–99)

## 2013-08-03 MED ORDER — GLUCERNA 1.2 CAL PO LIQD
1000.0000 mL | ORAL | Status: DC
  Administered 2013-08-04: 1000 mL
  Filled 2013-08-03 (×6): qty 1000

## 2013-08-03 MED ORDER — GLUCERNA 1.2 CAL PO LIQD: 1000.0000 mL | ORAL | Status: DC

## 2013-08-03 NOTE — Progress Notes (Signed)
FMTS Attending Daily Note:  Jeff Sherif Millspaugh MD  [phone] pager  Family Practice pager:  [phone] I have discussed this patient with the resident Dr. Adamo.   I agree with their findings, assessment, and care plan  

## 2013-08-03 NOTE — Progress Notes (Signed)
Occupational Therapy Treatment Patient Details Name: Rosemary Pentecost MRN: [id number] DOB: [birth year] Today's Date: 08/03/2013 Time: 4098-1191 OT Time Calculation (min): 36 min  OT Assessment / Plan / Recommendation  History of present illness Kiaan Overholser is a 67 y.o. male presenting with dysarthria, left-sided facial droop, and left-sided UE & LE weakness. PMH is significant for DM, HTN, previous smoker. MRI finds:  right paracentral pons; left paracentral medulla   OT comments  Mr. Knauff is making slow but steady progress toward ADLs.  He continues to need at least max assist for most functional transfers as well as mod to max for standing tasks.  His LUE is currently Brunnstrum stage IV in the arm and stage V in the hand, needing mod facilitation to incorporate it into functional tasks.  He also continues to need max to total assist for short distance mobility related to toileting tasks.  Feel he will be a great candidate for more intense rehab at a CIR level.  Will continue to follow for progressing to min assist/supervision acute level goals  Follow Up Recommendations  CIR       Equipment Recommendations  3 in 1 bedside comode;Tub/shower bench    Recommendations for Other Services Rehab consult  Frequency Min 3X/week   Progress towards OT Goals Progress towards OT goals: Progressing toward goals  Plan Discharge plan remains appropriate    Precautions / Restrictions Precautions Precautions: Fall Precaution Comments: right side head rotation and tilt, dysarthria, left hemiparesis Restrictions Weight Bearing Restrictions: No   Pertinent Vitals/Pain No report of pain during session    ADL  Toilet Transfer: Simulated;Maximal assistance Toilet Transfer Method: Stand pivot Toilet Transfer Equipment:  (simulated to the bedside chair) Toileting - Clothing Manipulation and Hygiene: Simulated;Maximal assistance Where Assessed - Engineer, mining and Hygiene: Other (comment) (sit  to stand from the EOB) Transfers/Ambulation Related to ADLs: Pt required max assist for stand pivot transfers.  Ambulated short distance around the foot of the bed with max assist hand held as well. ADL Comments: Pt worked on [employer] exercises in supine for the left shoulder, elbow, and digits prior to sitting EOB.  Therapist also educated pt's daughter-in-law on AAROM exercises as well, however they were not returned demonstrated this session.  With iniital sitting pt demonstrates increased LOB to the right requiring min assist to self correct.  Sat EOB for 10 mins while having pt reach forward and to the sides to work on trunk control and strengthening.  Overall min assist to regain balance when reaching way forward or to the right side.  Frequent LOB posteriorly with initial standing.  Pt tends to keep his head tilted to the right with slight rotation to the right and cervical flexion as well.  Provided education to pt and daughter-in-law as well on reaching, grasp, and replace using the LUE to help increase functional use.       Visit Information  Last OT Received On: 08/03/13 Assistance Needed: +1 History of Present Illness: Varick Keys is a 67 y.o. male presenting with dysarthria, left-sided facial droop, and left-sided UE & LE weakness. PMH is significant for DM, HTN, previous smoker. MRI finds:  right paracentral pons; left paracentral medulla          Cognition  Cognition Arousal/Alertness: Awake/alert Behavior During Therapy: Flat affect Overall Cognitive Status: Impaired/Different from baseline Area of Impairment: Orientation Orientation Level: Place Current Attention Level: Sustained Memory: Decreased short-term memory Awareness: Intellectual Problem Solving: Slow processing;Difficulty sequencing General Comments: Pt able to  state that he had a stroke and stated yes if asked if he had difficulty getting up and moving around.  He was unable to identify any problems with his speech or  the LUE until the therapist pointed them out.  He did exhibit some anticipatory awareness by stating that he would need help to get up back to bed however.    Mobility  Bed Mobility Bed Mobility: Rolling Left;Left Sidelying to Sit Rolling Left: 3: Mod assist Left Sidelying to Sit: 2: Max assist Transfers Transfers: Sit to Stand Sit to Stand: 3: Mod assist;Without upper extremity assist;From bed Stand to Sit: 3: Mod assist;With upper extremity assist;To chair/3-in-1       Balance Balance Balance Assessed: Yes Static Sitting Balance Static Sitting - Balance Support: Right upper extremity supported Static Sitting - Level of Assistance: 4: Min assist Static Sitting - Comment/# of Minutes: pt needing min assist to maintain static sitting EOB the first 2 mins or so. Dynamic Sitting Balance Dynamic Sitting - Balance Support: No upper extremity supported Dynamic Sitting - Level of Assistance: 4: Min assist Static Standing Balance Static Standing - Balance Support: Left upper extremity supported Static Standing - Level of Assistance: 2: Max assist   End of Session OT - End of Session Equipment Utilized During Treatment: Gait belt Activity Tolerance: Patient limited by fatigue Patient left: in chair;with call bell/phone within reach;with family/visitor present Nurse Communication: Mobility status (That pt is sitting up in the chair.)     Uniqua Kihn OTR/L 08/03/2013, 1:22 PM

## 2013-08-03 NOTE — Progress Notes (Signed)
Family Medicine Teaching Service Daily Progress Note Intern Pager: [phone]  Patient name: Keith [name] Medical record number: [id number] Date of birth: 1946/08/27 Age: 67 y.o. Gender: male  Primary Care Provider: Josue Hector, MD Consultants: None Code Status: Full Code  Pt Overview and Major Events to Date:   Assessment and Plan: Sahil Milner is a 67 y.o. male presenting with dysarthria, left-sided facial droop, and left-sided UE & LE weakness. PMH is significant for DM, HTN, previous smoker.   # Dysarthria and Facial, UE, LE weakness secondary to Stroke  Has been on ASA. CT shows atherosclerotic and physiologic intracranial calcifications w/o signs of acute stroke or hemorrhage. EKG showed NSR. I-stat Troponin neg in ED. CBC&CMET wnl. Lipid panel unremarkable.  - MRI: revealed two small acute brainstem infarcts: right paracentral pons, central to left paracentral medulla, possibly involving the left pyramid.  - Has microhemorrhage but neuro says this is likely sequela of hypertension and does not change management - Carotid doppler showed 1-39% stenosis and mild mixed plaque of the ICA - A1C 7.4, TSH 1.188 - Echo: grade 1 dyastolic dysfunction, no thrombus - ASA daily; Will initiate Plavix once able to take PO  - Will start statin when able to take PO  - follow-up PT/OT eval and treat  - UDS positive for benzos and opiates (both given here) - failed swallow eval, speech predicting safe for po in 7-10 days - will place ng and get nutrition consult for feeding in the meantime  # DM  - A1c 7.4 - Holding Metformin while NPO  - CBGs Q 4 hours; No insulin at this time as patient is NPO   #HTN: ranging 145-160 and trending up   - Continue holding home Cozaar and Norvasc  - Will restart if needed or at discharge   # Alcohol Abuse  - Reports drinking 4-5 beers daily; last drink ~ 1 wk prior to admission; Ethanol level neg on admission  - Discontinue CIWA protocol   #  Anxiety/depression: reports claustraphobia with multiple wires and tubes, very anxious on exam today, also reporting feeling like his stroke symptoms are "worse than death"  - ativan 0.5mg  prn anxiety (recieved total of 2mg  yesterday) - initiate SSRI when tolerating PO  FEN/GI: NS @ ; NPO Prophylaxis: Heparin subq  Disposition: pending clinical workup, SLP, PT, OT recommendations  Subjective: Patient is tearful today and continues to be depressed although less anxious appearing. He is still having generalized weakness which he says has improved slightly. He is complaining of some soreness on the back of his head today  Objective: Temp:  [97.3 F (36.3 C)-98 F (36.7 C)] 97.7 F (36.5 C) (12/02 0550) Pulse Rate:  [59-81] 62 (12/02 0550) Resp:  [18-20] 19 (12/02 0550) BP: (138-160)/(72-78) 160/78 mmHg (12/02 0550) SpO2:  [96 %-100 %] 98 % (12/02 0550)  Physical Exam: General: elderly male, lying in bed in no acute distress  Cardiovascular: Regular rate/rhythm Respiratory: Clear to auscultation bilaterally, no wheezes Abdomen: Soft, non-tender, non-distended Extremities: No edema Neuro: Alert and oriented x3. Left sided facial droop, R side strength normal, LLE strength 4/5, LUE strength 3/5, dysarthria present Psych: reports anxiety and appears depressed  Laboratory:  Recent Labs Lab 07/31/13 1415  WBC 7.7  HGB 15.5  HCT 44.3  PLT 257    Recent Labs Lab 07/31/13 1415  NA 134*  K 5.1  CL 94*  CO2 29  BUN 20  CREATININE 0.93  CALCIUM 10.3  PROT 7.4  BILITOT 0.5  ALKPHOS 59  ALT 53  AST 33  GLUCOSE 157*   Lipid Panel     Component Value Date/Time   CHOL 129 08/01/2013 0624   TRIG 92 08/01/2013 0624   HDL 53 08/01/2013 0624   CHOLHDL 2.4 08/01/2013 0624   VLDL 18 08/01/2013 0624   LDLCALC 58 08/01/2013 0624    Imaging/Diagnostic Tests: Echo:  Left ventricle: The cavity size was normal. Wall thickness was normal. Systolic function was normal. The  estimated ejection fraction was in the range of 55% to 60%. Wall motion was normal; there were no regional wall motion abnormalities. Doppler parameters are consistent with abnormal left ventricular relaxation (grade 1 diastolic dysfunction). Pulmonary arteries: PA peak pressure: 33mm Hg (S).  Carotid dopplers: Bilateral: mild intimal wall thickening CCA. Mild mixed plaque origin ICA. 1-39% ICA stenosis. Verebral artery flow is antegrade.   Beverely Low, MD 08/03/2013, 7:05 AM PGY-1, [facility] Family Medicine FPTS Intern pager: [phone], text pages welcome

## 2013-08-03 NOTE — Progress Notes (Signed)
Around 0700, RN heard pt's bed alarm going off and rushed into the room. RN witness pt sitting on his knees on the floor next to bed. Pt was confused and at baseline. RN called for additional assistance and pt was helped to bed. Charge RN made aware. Pt's MD called regarding the patient. VS stable, no injuries present. No orders given by MD. Pt's family member in bathroom when pt's bed alarm went off. Salvadore Oxford, RN [phone] 08/03/13

## 2013-08-03 NOTE — Progress Notes (Signed)
SLP Cancellation Note  Patient Details Name: Keith [name] MRN: [id number] DOB: [birth year]   Cancelled treatment:       Reason Eval/Treat Not Completed:  Cancelled treatment:      Reason Eval/Treat Not Completed: Other (comment) (pt with panda tube placement, xray checking for correct placement, will return another time)   Donavan Burnet, MS [facility] SLP [phone]

## 2013-08-03 NOTE — Progress Notes (Signed)
INITIAL NUTRITION ASSESSMENT  DOCUMENTATION CODES Per approved criteria  -Not Applicable   INTERVENTION:  Initiate Glucerna 1.2 formula at 25 ml/hr and increase by 10 ml every 4 hours to goal rate of 65 ml/hr to provide 1872 total kcals, 94 gm protein, 1256 ml of free water RD to follow for nutrition care plan  NUTRITION DIAGNOSIS: Inadequate oral intake related to moderate-severe dysphagia as evidenced by NPO status  Goal: EN to meet > 90% of estimated nutrition needs  Monitor:  EN regimen & tolerance, weight, labs, I/O's  Reason for Assessment: Consult  67 y.o. male  Admitting Dx: dysarthria/facial droop/weakness   ASSESSMENT: Patient with PMH of DM, HTN, previous smoker; presented with left facial droop, dysarthria, left UE and LE weakness; MRI showed 2 brain stem strokes impacting right paracentral pons central to left paracent medulla.   RD unable to interview & obtain nutrition hx from patient; s/p MBSS 11/30; patient presented with moderate-severe dysphagia; short-term EN warranted; small-bore feeding tube in place; location of tip unknown at this time.  RD consulted for EN initiation & management.   Height: Ht Readings from Last 1 Encounters:  07/31/13 6\' 1"  (1.854 m)    Weight: Wt Readings from Last 1 Encounters:  07/31/13 170 lb 6.4 oz (77.293 kg)    Ideal Body Weight: 184 lb  % Ideal Body Weight: 92%  Wt Readings from Last 10 Encounters:  07/31/13 170 lb 6.4 oz (77.293 kg)    Usual Body Weight: unable to obtain  % Usual Body Weight: ---  BMI:  Body mass index is 22.49 kg/(m^2).  Estimated Nutritional Needs: Kcal: 1900-2100 Protein: 95-105 gm Fluid: 1.9-2.1 L  Skin: Intact  Diet Order: NPO  EDUCATION NEEDS: -No education needs identified at this time   Intake/Output Summary (Last 24 hours) at 08/03/13 1145 Last data filed at 08/03/13 0445  Gross per 24 hour  Intake      0 ml  Output    625 ml  Net   -625 ml    Labs:   Recent  Labs Lab 07/31/13 1415  NA 134*  K 5.1  CL 94*  CO2 29  BUN 20  CREATININE 0.93  CALCIUM 10.3  GLUCOSE 157*    CBG (last 3)   Recent Labs  08/02/13 2017 08/03/13 0643 08/03/13 1106  GLUCAP 124* 107* 111*    Scheduled Meds: . aspirin  300 mg Rectal Daily   Or  . aspirin  325 mg Oral Daily  . fluticasone  2 spray Each Nare BID  . folic acid  1 mg Oral Daily  . heparin  5,000 Units Subcutaneous Q8H  . multivitamin with minerals  1 tablet Oral Daily  . thiamine  100 mg Oral Daily   Or  . thiamine  100 mg Intravenous Daily    Continuous Infusions: . sodium chloride 100 mL/hr at 08/03/13 1610    Past Medical History  Diagnosis Date  . Diabetes mellitus without complication   . Hypertension     Past Surgical History  Procedure Laterality Date  . Prostate surgery    . Knee surgery      Maureen Chatters, RD, LDN Pager #: [phone] After-Hours Pager #: [phone]

## 2013-08-04 LAB — GLUCOSE, CAPILLARY
Glucose-Capillary: 103 mg/dL — ABNORMAL HIGH (ref 70–99)
Glucose-Capillary: 116 mg/dL — ABNORMAL HIGH (ref 70–99)
Glucose-Capillary: 140 mg/dL — ABNORMAL HIGH (ref 70–99)
Glucose-Capillary: 144 mg/dL — ABNORMAL HIGH (ref 70–99)

## 2013-08-04 MED ORDER — ZOLPIDEM TARTRATE 5 MG PO TABS
5.0000 mg | ORAL_TABLET | Freq: Every day | ORAL | Status: AC
  Administered 2013-08-04: 5 mg via ORAL
  Filled 2013-08-04: qty 1

## 2013-08-04 MED ORDER — ADULT MULTIVITAMIN W/MINERALS CH
1.0000 | ORAL_TABLET | Freq: Every day | ORAL | Status: DC
  Administered 2013-08-04 – 2013-08-05 (×2): 1
  Filled 2013-08-04 (×2): qty 1

## 2013-08-04 MED ORDER — FOLIC ACID 1 MG PO TABS
1.0000 mg | ORAL_TABLET | Freq: Every day | ORAL | Status: DC
  Administered 2013-08-04 – 2013-08-05 (×2): 1 mg
  Filled 2013-08-04 (×2): qty 1

## 2013-08-04 MED ORDER — CLOPIDOGREL BISULFATE 75 MG PO TABS
75.0000 mg | ORAL_TABLET | Freq: Every day | ORAL | Status: DC
  Administered 2013-08-05: 75 mg via NASOGASTRIC
  Filled 2013-08-04: qty 1

## 2013-08-04 MED ORDER — ATORVASTATIN CALCIUM 80 MG PO TABS
80.0000 mg | ORAL_TABLET | Freq: Every day | ORAL | Status: DC
  Administered 2013-08-04: 80 mg via NASOGASTRIC
  Filled 2013-08-04 (×2): qty 1

## 2013-08-04 MED ORDER — AMLODIPINE BESYLATE 5 MG PO TABS
5.0000 mg | ORAL_TABLET | Freq: Every day | ORAL | Status: DC
  Administered 2013-08-04 – 2013-08-05 (×2): 5 mg via NASOGASTRIC
  Filled 2013-08-04 (×2): qty 1

## 2013-08-04 MED ORDER — SERTRALINE HCL 50 MG PO TABS
50.0000 mg | ORAL_TABLET | Freq: Every day | ORAL | Status: DC
  Administered 2013-08-04 – 2013-08-05 (×2): 50 mg via ORAL
  Filled 2013-08-04 (×2): qty 1

## 2013-08-04 NOTE — Clinical Social Work Note (Signed)
CSW attempted to speak with pt's son, [name] Roulhac [phone]), regarding SNF as a back-up plan for CIR. [name] did not answer phone number on facesheet. CSW left message requesting for [name] to return call. CSW will follow-up in the am.  Darlyn Chamber, [facility] Clinical Social Worker [phone]

## 2013-08-04 NOTE — Progress Notes (Signed)
Speech Language Pathology Treatment: Dysphagia  Patient Details Name: Keith [name] MRN: [id number] DOB: Aug 16, 1946 Today's Date: 08/04/2013 Time: 4098-1191 SLP Time Calculation (min): 15 min  Assessment / Plan / Recommendation Clinical Impression  SLP led pt in PO trials with pharyngeal strengthening exercises. Guided pt in feeling for laryngeal elevation for biofeedback on strength and timing of swallow. Making progress. Will consider repeat MBS Friday/Monday.    HPI HPI: 67 yo male adm to [facility] with left facial droop, dysarthria,  left UE and LE weakness- pt found to have 2 brain stem strokes impacting right paracentral pons central to left paracent medulla.  MBS ordered due to concerns for dysphagia.  Pt admits to h/o "sore throat" and transient problems swallowing in the last few weeks.  Pt is a previous smoker, CXR negative 11/29.  Pt seen for speech language evaluation and swallow treatment.    Pertinent Vitals NA  SLP Plan  Continue with current plan of care    Recommendations Diet recommendations: NPO Medication Administration: Via alternative means              General recommendations: Rehab consult Oral Care Recommendations: Oral care Q4 per protocol Follow up Recommendations: Inpatient Rehab Plan: Continue with current plan of care    GO    [facility], MA CCC-SLP [phone]  Keith [name] 08/04/2013, 4:21 PM

## 2013-08-04 NOTE — Progress Notes (Signed)
Physical Therapy Treatment Patient Details Name: Keith [name] MRN: [id number] DOB: 01-Sep-1946 Today's Date: 08/04/2013 Time: 0865-7846 PT Time Calculation (min): 26 min  PT Assessment / Plan / Recommendation  History of Present Illness Keith [name] is a 67 y.o. male presenting with dysarthria, left-sided facial droop, and left-sided UE & LE weakness. PMH is significant for DM, HTN, previous smoker. MRI finds:  right paracentral pons; left paracentral medulla   PT Comments   Patient performed some in room pre-gait and ambulation activities in addition to there-ex and trunk control activities seated EOB. Patient tolerated well, limited overall distance with multiple seated rest breaks secondary to LLE weakness. Pt reports increased generalized weakness today. Will continue to progress activity with patient as tolerated.  Follow Up Recommendations  CIR           Equipment Recommendations  Rolling walker with 5" wheels    Recommendations for Other Services Rehab consult  Frequency Min 4X/week   Progress towards PT Goals Progress towards PT goals: Progressing toward goals  Plan Current plan remains appropriate    Precautions / Restrictions Precautions Precautions: Fall Precaution Comments: right side head rotation and tilt, dysarthria, left hemiparesis   Pertinent Vitals/Pain No pain at this time, patient reports increased generalized weakness with all activity today    Mobility  Bed Mobility Bed Mobility: Rolling Left;Left Sidelying to Sit;Sitting - Scoot to [place] of Bed;Sit to Sidelying Left;Scooting to [facility] Rolling Left: 4: Min assist Left Sidelying to Sit: 3: Mod assist;With rails;HOB elevated Sitting - Scoot to Edge of Bed: 4: Min assist (with chuck pad) Sit to Supine: 4: Min assist Scooting to HOB: 4: Min assist;With rail (reversed incline position of bed) Details for Bed Mobility Assistance: assist to elevate trunk and rotate hips to EOB, assist for LLE back to bed, VCs for  positioing and technique to scoot HOB Transfers Transfers: Sit to Stand;Stand to Sit Sit to Stand: 3: Mod assist;Without upper extremity assist;From bed Stand to Sit: 3: Mod assist;With upper extremity assist;To chair/3-in-1 Details for Transfer Assistance: Assist for stability, posterior lean still present, VCs for upright posture.  Performed x4 with seated rest breaks in between Ambulation/Gait Ambulation/Gait Assistance: 3: Mod assist Assistive device: Rolling walker Ambulation/Gait Assistance Details: Posterior tendency during ambulation, Cues for upright posture, LLE buckling present today, pt with decreased ambulation compared to prior sessions, performed 4 trials of approximately 10 steps (fwd and bkward) in room with seated breaks in between Gait Pattern: Step-through pattern;Trunk flexed;Lateral trunk lean to right;Decreased stride length;Trunk rotated posteriorly on right;Decreased stance time - right Gait velocity: decreased Modified Rankin (Stroke Patients Only) Pre-Morbid Rankin Score: No symptoms Modified Rankin: Moderately severe disability    Exercises General Exercises - Lower Extremity Ankle Circles/Pumps: AROM;Both;10 reps Long Arc Quad: AROM;Left;5 reps (with manual resistance at EOR 5 second holds) Hip Flexion/Marching: AROM;Left;10 reps Toe Raises: AROM;Both;10 reps Heel Raises: AROM;Both;10 reps    PT Goals (current goals can now be found in the care plan section) Acute Rehab PT Goals Patient Stated Goal: none stated PT Goal Formulation: With patient/family Time For Goal Achievement: 08/15/13 Potential to Achieve Goals: Good  Visit Information  Last PT Received On: 08/04/13 Assistance Needed: +1 History of Present Illness: Keith [name] is a 67 y.o. male presenting with dysarthria, left-sided facial droop, and left-sided UE & LE weakness. PMH is significant for DM, HTN, previous smoker. MRI finds:  right paracentral pons; left paracentral medulla    Subjective  Data  Patient Stated Goal: none stated  Cognition  Cognition Arousal/Alertness: Awake/alert Behavior During Therapy: Flat affect Overall Cognitive Status: Impaired/Different from baseline Current Attention Level: Sustained Problem Solving: Slow processing;Difficulty sequencing General Comments: Cognition improving compared to prior sessions    Balance  Static Sitting Balance Static Sitting - Balance Support: Right upper extremity supported Static Sitting - Level of Assistance: 4: Min assist Static Sitting - Comment/# of Minutes: Occasional min assist to return to midline, VCs for correction of positioning, able to self correct at times without assist Dynamic Sitting Balance Dynamic Sitting - Balance Support: No upper extremity supported Dynamic Sitting - Level of Assistance: 4: Min assist Static Standing Balance Static Standing - Balance Support: Bilateral upper extremity supported;During functional activity Static Standing - Level of Assistance: 3: Mod assist Dynamic Standing Balance Dynamic Standing - Balance Support: Bilateral upper extremity supported Dynamic Standing - Level of Assistance: 3: Mod assist Dynamic Standing - Balance Activities: Lateral lean/weight shifting (standing march)  End of Session PT - End of Session Equipment Utilized During Treatment: Gait belt Activity Tolerance: Patient tolerated treatment well Patient left: in bed;with call bell/phone within reach;with family/visitor present Nurse Communication: Mobility status   GP     Fabio Asa 08/04/2013, 3:11 PM Charlotte Crumb, PT DPT  [phone]

## 2013-08-04 NOTE — Progress Notes (Signed)
Rehab admissions - Evaluated for possible admission.  I spoke with patient who is dysarthric.  I called his son with his permission.  I left a message with his son, [name], to return my call.  Patient did live alone PTA and I need to determine what social supports might be available for the patient.  Call me for questions.  #[phone]

## 2013-08-04 NOTE — Progress Notes (Signed)
FMTS Attending Daily Note:  Renold Don MD  [phone] pager  Family Practice pager:  [phone] I have seen and examined this patient and have reviewed their chart. I have discussed this patient with the resident. I agree with the resident's findings, assessment and care plan.  Tobey Grim, MD 08/04/2013 3:40 PM

## 2013-08-04 NOTE — ED Provider Notes (Signed)
Medical screening examination/treatment/procedure(s) were conducted as a shared visit with non-physician practitioner(s) and myself.  I personally evaluated the patient during the encounter.  EKG Interpretation    Date/Time:  Saturday July 31 2013 13:30:09 EST Ventricular Rate:  87 PR Interval:  176 QRS Duration: 88 QT Interval:  366 QTC Calculation: 440 R Axis:   -4 Text Interpretation:  Normal sinus rhythm ST abnormality, possible digitalis effect Abnormal ECG ED PHYSICIAN INTERPRETATION AVAILABLE IN [facility] Confirmed by TEST, RECORD ([id number]) on 08/02/2013 7:52:14 AM           Patient brought in by son for evaluation of multiple neurological complaints including  confusion, slurred speech, diplopia, facial droop and ataxia. Symptoms have waxed and waned over the past week or so. He was evaluated in emergency room at an outside hospital yesterday. He apparently had a CT and was then sent home for outpatient followup. He has continued to decline since that evaluation. On exam patient is dysarthric. Facial droop. Difficulty with finger-nose testing with his left upper extremity. Truncal ataxia. Symptoms are consistent with a CVA. CT does not show any acute abnormality. Will admit for further evaluation.  Raeford Razor, MD 08/04/13 [phone]

## 2013-08-04 NOTE — Progress Notes (Signed)
Family Medicine Teaching Service Daily Progress Note Intern Pager: [phone]  Patient name: Keith [name] Medical record number: [id number] Date of birth: 1946-07-26 Age: 67 y.o. Gender: male  Primary Care Provider: Josue Hector, MD Consultants: None Code Status: Full Code  Pt Overview and Major Events to Date:   Assessment and Plan: Ashdon Gillson is a 67 y.o. male presenting with dysarthria, left-sided facial droop, and left-sided UE & LE weakness. PMH is significant for DM, HTN, previous smoker.   # Dysarthria and Facial, UE, LE weakness secondary to Stroke  Has been on ASA. CT shows atherosclerotic and physiologic intracranial calcifications w/o signs of acute stroke or hemorrhage. EKG showed NSR. I-stat Troponin neg in ED. CBC&CMET wnl. Lipid panel unremarkable.  - MRI: revealed two small acute brainstem infarcts: right paracentral pons, central to left paracentral medulla, possibly involving the left pyramid.  - Has microhemorrhage but neuro says this is likely sequela of hypertension and does not change management - Carotid doppler showed 1-39% stenosis and mild mixed plaque of the ICA - A1C 7.4, TSH 1.188 - Echo: grade 1 dyastolic dysfunction, no thrombus - ASA daily; Will initiate Plavix once able to take PO  - Will start statin when able to take PO  - PT/OT/SLP recommending CIR, patient amenable  - UDS positive for benzos and opiates (both given here) - failed swallow eval, speech predicting safe for po in 7-10 days - has NG with glucerna at 5mL/hour (running at goal, tolerating well)  # DM  - A1c 7.4 - Holding Metformin while inpatient - CBGs Q 4 hours; No insulin at this time but will continue to monitor and may need insulin now that he is receiving adequate nutrition   #HTN: SBP 160s and trending up   - Continue holding home Cozaar and Norvasc  - Will restart if needed or at discharge   # Alcohol Abuse  - Reports drinking 4-5 beers daily; last drink ~ 1 wk prior  to admission; Ethanol level neg on admission  - Discontinued CIWA protocol   # Anxiety/depression: reports claustraphobia with multiple wires and tubes, less anxious and more optimistic on exam today  - ativan 0.5mg  prn anxiety (recieved total of 2mg  yesterday) - initiate SSRI when tolerating PO  FEN/GI: NS @ ; Glucerna per NG at 35mL/hr Prophylaxis: Heparin subq  Disposition: Medically stable and ready for discharge to CIR when bed is available  Subjective: Patient is feeling better today, denies anxious and affect is significantly more optimistic, reports the NG bothered him at first but he has gotten used to it. He is requesting a bath.  Objective: Temp:  [97.5 F (36.4 C)-98.5 F (36.9 C)] 97.8 F (36.6 C) (12/03 0432) Pulse Rate:  [61-79] 66 (12/03 0432) Resp:  [18-20] 20 (12/03 0432) BP: (154-171)/(72-86) 162/72 mmHg (12/03 0432) SpO2:  [94 %-100 %] 94 % (12/03 0432) Weight:  [168 lb 11.2 oz (76.522 kg)-169 lb (76.658 kg)] 169 lb (76.658 kg) (12/03 0500)  Physical Exam: General: elderly male, lying in bed in no acute distress  Cardiovascular: Regular rate/rhythm Respiratory: Clear to auscultation bilaterally, no wheezes Abdomen: Soft, non-tender, non-distended Extremities: No edema Neuro: Alert and oriented x3. Left sided facial droop, R side strength normal, LLE strength 4/5, LUE strength 3/5, dysarthria present Psych: reports anxiety and appears depressed  Laboratory:  Recent Labs Lab 07/31/13 1415  WBC 7.7  HGB 15.5  HCT 44.3  PLT 257    Recent Labs Lab 07/31/13 1415  NA 134*  K 5.1  CL 94*  CO2 29  BUN 20  CREATININE 0.93  CALCIUM 10.3  PROT 7.4  BILITOT 0.5  ALKPHOS 59  ALT 53  AST 33  GLUCOSE 157*   Lipid Panel     Component Value Date/Time   CHOL 129 08/01/2013 0624   TRIG 92 08/01/2013 0624   HDL 53 08/01/2013 0624   CHOLHDL 2.4 08/01/2013 0624   VLDL 18 08/01/2013 0624   LDLCALC 58 08/01/2013 0624    Imaging/Diagnostic  Tests: Echo:  Left ventricle: The cavity size was normal. Wall thickness was normal. Systolic function was normal. The estimated ejection fraction was in the range of 55% to 60%. Wall motion was normal; there were no regional wall motion abnormalities. Doppler parameters are consistent with abnormal left ventricular relaxation (grade 1 diastolic dysfunction). Pulmonary arteries: PA peak pressure: 33mm Hg (S).  Carotid dopplers: Bilateral: mild intimal wall thickening CCA. Mild mixed plaque origin ICA. 1-39% ICA stenosis. Verebral artery flow is antegrade.   Beverely Low, MD 08/04/2013, 7:03 AM PGY-1, [facility] Family Medicine FPTS Intern pager: [phone], text pages welcome

## 2013-08-05 ENCOUNTER — Encounter (HOSPITAL_COMMUNITY): Payer: Self-pay | Admitting: *Deleted

## 2013-08-05 ENCOUNTER — Inpatient Hospital Stay (HOSPITAL_COMMUNITY)
Admission: RE | Admit: 2013-08-05 | Discharge: 2013-08-20 | DRG: 945 | Disposition: A | Discharge status: Skilled Nursing Facility | Payer: Medicare Other | Source: Intra-hospital | Attending: Physical Medicine & Rehabilitation | Admitting: Physical Medicine & Rehabilitation

## 2013-08-05 DIAGNOSIS — Z87891 Personal history of nicotine dependence: Secondary | ICD-10-CM

## 2013-08-05 DIAGNOSIS — E119 Type 2 diabetes mellitus without complications: Secondary | ICD-10-CM | POA: Diagnosis present

## 2013-08-05 DIAGNOSIS — Z79899 Other long term (current) drug therapy: Secondary | ICD-10-CM

## 2013-08-05 DIAGNOSIS — Z5189 Encounter for other specified aftercare: Principal | ICD-10-CM

## 2013-08-05 DIAGNOSIS — R29810 Facial weakness: Secondary | ICD-10-CM | POA: Diagnosis present

## 2013-08-05 DIAGNOSIS — F1010 Alcohol abuse, uncomplicated: Secondary | ICD-10-CM | POA: Diagnosis present

## 2013-08-05 DIAGNOSIS — I6330 Cerebral infarction due to thrombosis of unspecified cerebral artery: Secondary | ICD-10-CM

## 2013-08-05 DIAGNOSIS — R1310 Dysphagia, unspecified: Secondary | ICD-10-CM | POA: Diagnosis present

## 2013-08-05 DIAGNOSIS — I69991 Dysphagia following unspecified cerebrovascular disease: Secondary | ICD-10-CM

## 2013-08-05 DIAGNOSIS — R531 Weakness: Secondary | ICD-10-CM | POA: Diagnosis present

## 2013-08-05 DIAGNOSIS — F418 Other specified anxiety disorders: Secondary | ICD-10-CM | POA: Diagnosis present

## 2013-08-05 DIAGNOSIS — F3289 Other specified depressive episodes: Secondary | ICD-10-CM | POA: Diagnosis present

## 2013-08-05 DIAGNOSIS — I69993 Ataxia following unspecified cerebrovascular disease: Secondary | ICD-10-CM

## 2013-08-05 DIAGNOSIS — IMO0002 Reserved for concepts with insufficient information to code with codable children: Secondary | ICD-10-CM

## 2013-08-05 DIAGNOSIS — F329 Major depressive disorder, single episode, unspecified: Secondary | ICD-10-CM | POA: Diagnosis present

## 2013-08-05 DIAGNOSIS — I10 Essential (primary) hypertension: Secondary | ICD-10-CM | POA: Diagnosis present

## 2013-08-05 DIAGNOSIS — I639 Cerebral infarction, unspecified: Secondary | ICD-10-CM | POA: Diagnosis present

## 2013-08-05 DIAGNOSIS — E785 Hyperlipidemia, unspecified: Secondary | ICD-10-CM | POA: Diagnosis present

## 2013-08-05 DIAGNOSIS — E1142 Type 2 diabetes mellitus with diabetic polyneuropathy: Secondary | ICD-10-CM | POA: Diagnosis present

## 2013-08-05 DIAGNOSIS — E1149 Type 2 diabetes mellitus with other diabetic neurological complication: Secondary | ICD-10-CM | POA: Diagnosis present

## 2013-08-05 LAB — CREATININE, SERUM
Creatinine, Ser: 0.56 mg/dL (ref 0.50–1.35)
GFR calc non Af Amer: >90 mL/min (ref >90)

## 2013-08-05 LAB — GLUCOSE, CAPILLARY
Glucose-Capillary: 154 mg/dL — ABNORMAL HIGH (ref 70–99)
Glucose-Capillary: 156 mg/dL — ABNORMAL HIGH (ref 70–99)
Glucose-Capillary: 159 mg/dL — ABNORMAL HIGH (ref 70–99)
Glucose-Capillary: 180 mg/dL — ABNORMAL HIGH (ref 70–99)
Glucose-Capillary: 192 mg/dL — ABNORMAL HIGH (ref 70–99)

## 2013-08-05 LAB — CBC
HCT: 36.8 % — ABNORMAL LOW (ref 39.0–52.0)
Hemoglobin: 13.4 g/dL (ref 13.0–17.0)
MCHC: 36.4 g/dL — ABNORMAL HIGH (ref 30.0–36.0)
MCV: 94.6 fL (ref 78.0–100.0)
RDW: 11.9 % (ref 11.5–15.5)

## 2013-08-05 MED ORDER — ASPIRIN 325 MG PO TABS
325.0000 mg | ORAL_TABLET | Freq: Every day | ORAL | Status: DC
  Administered 2013-08-06 – 2013-08-20 (×14): 325 mg via ORAL
  Filled 2013-08-05 (×16): qty 1

## 2013-08-05 MED ORDER — HEPARIN SODIUM (PORCINE) 5000 UNIT/ML IJ SOLN
5000.0000 [IU] | Freq: Three times a day (TID) | INTRAMUSCULAR | Status: DC
  Administered 2013-08-05 – 2013-08-20 (×44): 5000 [IU] via SUBCUTANEOUS
  Filled 2013-08-05 (×47): qty 1

## 2013-08-05 MED ORDER — GLUCERNA 1.2 CAL PO LIQD
1000.0000 mL | ORAL | Status: DC
  Administered 2013-08-05 – 2013-08-11 (×7): 1000 mL
  Filled 2013-08-05 (×14): qty 1000

## 2013-08-05 MED ORDER — THIAMINE HCL 100 MG PO TABS: 100.0000 mg | ORAL_TABLET | Freq: Every day | ORAL | Status: AC

## 2013-08-05 MED ORDER — SERTRALINE HCL 50 MG PO TABS: 50.0000 mg | ORAL_TABLET | Freq: Every day | ORAL | Status: DC

## 2013-08-05 MED ORDER — INSULIN ASPART 100 UNIT/ML ~~LOC~~ SOLN
0.0000 [IU] | SUBCUTANEOUS | Status: DC
  Administered 2013-08-05 (×2): 3 [IU] via SUBCUTANEOUS

## 2013-08-05 MED ORDER — INSULIN ASPART 100 UNIT/ML ~~LOC~~ SOLN
0.0000 [IU] | SUBCUTANEOUS | Status: DC
  Administered 2013-08-05: 2 [IU] via SUBCUTANEOUS
  Administered 2013-08-05 – 2013-08-06 (×3): 3 [IU] via SUBCUTANEOUS
  Administered 2013-08-06: 2 [IU] via SUBCUTANEOUS
  Administered 2013-08-06: 3 [IU] via SUBCUTANEOUS
  Administered 2013-08-06: 2 [IU] via SUBCUTANEOUS
  Administered 2013-08-06 – 2013-08-07 (×2): 3 [IU] via SUBCUTANEOUS
  Administered 2013-08-07: 2 [IU] via SUBCUTANEOUS
  Administered 2013-08-07 (×2): 3 [IU] via SUBCUTANEOUS
  Administered 2013-08-07: 2 [IU] via SUBCUTANEOUS
  Administered 2013-08-07 – 2013-08-08 (×2): 3 [IU] via SUBCUTANEOUS
  Administered 2013-08-08: 2 [IU] via SUBCUTANEOUS
  Administered 2013-08-08 (×3): 3 [IU] via SUBCUTANEOUS
  Administered 2013-08-08 – 2013-08-09 (×2): 2 [IU] via SUBCUTANEOUS
  Administered 2013-08-09 (×3): 3 [IU] via SUBCUTANEOUS
  Administered 2013-08-09: 2 [IU] via SUBCUTANEOUS
  Administered 2013-08-09 – 2013-08-11 (×12): 3 [IU] via SUBCUTANEOUS
  Administered 2013-08-11: 2 [IU] via SUBCUTANEOUS
  Administered 2013-08-12: 5 [IU] via SUBCUTANEOUS
  Administered 2013-08-12 – 2013-08-13 (×8): 3 [IU] via SUBCUTANEOUS
  Administered 2013-08-13 – 2013-08-14 (×4): 5 [IU] via SUBCUTANEOUS
  Administered 2013-08-14: 6 [IU] via SUBCUTANEOUS
  Administered 2013-08-14: 3 [IU] via SUBCUTANEOUS
  Administered 2013-08-14: 5 [IU] via SUBCUTANEOUS
  Administered 2013-08-14: 8 [IU] via SUBCUTANEOUS
  Administered 2013-08-15 (×2): 3 [IU] via SUBCUTANEOUS
  Administered 2013-08-15: 05:00:00 via SUBCUTANEOUS
  Administered 2013-08-15: 2 [IU] via SUBCUTANEOUS
  Administered 2013-08-15: 3 [IU] via SUBCUTANEOUS
  Administered 2013-08-16 (×2): 2 [IU] via SUBCUTANEOUS
  Administered 2013-08-16 (×2): 3 [IU] via SUBCUTANEOUS
  Administered 2013-08-16: 5 [IU] via SUBCUTANEOUS
  Administered 2013-08-16 – 2013-08-17 (×3): 3 [IU] via SUBCUTANEOUS
  Administered 2013-08-17: 5 [IU] via SUBCUTANEOUS
  Administered 2013-08-17: 3 [IU] via SUBCUTANEOUS
  Administered 2013-08-17 – 2013-08-18 (×6): 2 [IU] via SUBCUTANEOUS
  Administered 2013-08-18: 3 [IU] via SUBCUTANEOUS
  Administered 2013-08-19: 2 [IU] via SUBCUTANEOUS
  Administered 2013-08-19: 3 [IU] via SUBCUTANEOUS
  Administered 2013-08-19 (×3): 2 [IU] via SUBCUTANEOUS
  Administered 2013-08-19 – 2013-08-20 (×2): 3 [IU] via SUBCUTANEOUS
  Administered 2013-08-20: 2 [IU] via SUBCUTANEOUS
  Administered 2013-08-20: 3 [IU] via SUBCUTANEOUS
  Administered 2013-08-20: 2 [IU] via SUBCUTANEOUS

## 2013-08-05 MED ORDER — TRAMADOL HCL 50 MG PO TABS
50.0000 mg | ORAL_TABLET | Freq: Four times a day (QID) | ORAL | Status: DC | PRN
  Administered 2013-08-05 – 2013-08-17 (×14): 50 mg via ORAL
  Filled 2013-08-05 (×14): qty 1

## 2013-08-05 MED ORDER — FOLIC ACID 1 MG PO TABS
1.0000 mg | ORAL_TABLET | Freq: Every day | ORAL | Status: DC
  Administered 2013-08-06 – 2013-08-19 (×13): 1 mg
  Filled 2013-08-05 (×16): qty 1

## 2013-08-05 MED ORDER — HEPARIN SODIUM (PORCINE) 5000 UNIT/ML IJ SOLN: 5000.0000 [IU] | Freq: Three times a day (TID) | INTRAMUSCULAR | Status: DC

## 2013-08-05 MED ORDER — ATORVASTATIN CALCIUM 80 MG PO TABS: 80.0000 mg | ORAL_TABLET | Freq: Every day | ORAL | Status: DC

## 2013-08-05 MED ORDER — FLUTICASONE PROPIONATE 50 MCG/ACT NA SUSP
2.0000 | Freq: Two times a day (BID) | NASAL | Status: DC
  Administered 2013-08-05 – 2013-08-20 (×29): 2 via NASAL
  Filled 2013-08-05 (×2): qty 16

## 2013-08-05 MED ORDER — LORAZEPAM 0.5 MG PO TABS
0.5000 mg | ORAL_TABLET | ORAL | Status: DC | PRN
  Administered 2013-08-05 – 2013-08-19 (×11): 0.5 mg
  Filled 2013-08-05 (×11): qty 1

## 2013-08-05 MED ORDER — ONDANSETRON HCL 4 MG/2ML IJ SOLN: 4.0000 mg | Freq: Four times a day (QID) | INTRAMUSCULAR | Status: DC | PRN

## 2013-08-05 MED ORDER — ADULT MULTIVITAMIN W/MINERALS CH: 1.0000 | ORAL_TABLET | Freq: Every day | ORAL | Status: DC

## 2013-08-05 MED ORDER — LORAZEPAM 0.5 MG PO TABS: 0.5000 mg | ORAL_TABLET | Freq: Four times a day (QID) | ORAL | Status: AC | PRN

## 2013-08-05 MED ORDER — SODIUM CHLORIDE 0.9 % IV SOLN
INTRAVENOUS | Status: DC
  Administered 2013-08-05 – 2013-08-06 (×2): via INTRAVENOUS

## 2013-08-05 MED ORDER — GLUCERNA 1.2 CAL PO LIQD: 1000.0000 mL | ORAL | Status: DC

## 2013-08-05 MED ORDER — ADULT MULTIVITAMIN W/MINERALS CH
1.0000 | ORAL_TABLET | Freq: Every day | ORAL | Status: DC
  Administered 2013-08-06 – 2013-08-19 (×13): 1
  Filled 2013-08-05 (×16): qty 1

## 2013-08-05 MED ORDER — SERTRALINE HCL 50 MG PO TABS
50.0000 mg | ORAL_TABLET | Freq: Every day | ORAL | Status: DC
  Administered 2013-08-06 – 2013-08-20 (×14): 50 mg via ORAL
  Filled 2013-08-05 (×16): qty 1

## 2013-08-05 MED ORDER — FOLIC ACID 1 MG PO TABS: 1.0000 mg | ORAL_TABLET | Freq: Every day | ORAL | Status: DC

## 2013-08-05 MED ORDER — INSULIN ASPART 100 UNIT/ML ~~LOC~~ SOLN: 0.0000 [IU] | Freq: Every day | SUBCUTANEOUS | Status: DC

## 2013-08-05 MED ORDER — ATORVASTATIN CALCIUM 80 MG PO TABS
80.0000 mg | ORAL_TABLET | Freq: Every day | ORAL | Status: DC
  Administered 2013-08-05 – 2013-08-19 (×13): 80 mg via NASOGASTRIC
  Filled 2013-08-05 (×17): qty 1

## 2013-08-05 MED ORDER — ONDANSETRON HCL 4 MG PO TABS: 4.0000 mg | ORAL_TABLET | Freq: Four times a day (QID) | ORAL | Status: DC | PRN

## 2013-08-05 MED ORDER — ACETAMINOPHEN 325 MG PO TABS
650.0000 mg | ORAL_TABLET | Freq: Four times a day (QID) | ORAL | Status: DC | PRN
  Administered 2013-08-11 – 2013-08-13 (×2): 650 mg via ORAL
  Filled 2013-08-05 (×2): qty 2

## 2013-08-05 MED ORDER — CLOPIDOGREL BISULFATE 75 MG PO TABS: 75.0000 mg | ORAL_TABLET | Freq: Every day | ORAL | Status: DC

## 2013-08-05 MED ORDER — ASPIRIN 300 MG RE SUPP
300.0000 mg | Freq: Every day | RECTAL | Status: DC
  Filled 2013-08-05 (×16): qty 1

## 2013-08-05 MED ORDER — INSULIN ASPART 100 UNIT/ML ~~LOC~~ SOLN: 0.0000 [IU] | Freq: Three times a day (TID) | SUBCUTANEOUS | Status: DC

## 2013-08-05 MED ORDER — ASPIRIN 325 MG PO TABS: 325.0000 mg | ORAL_TABLET | Freq: Every day | ORAL | Status: AC

## 2013-08-05 MED ORDER — SENNOSIDES-DOCUSATE SODIUM 8.6-50 MG PO TABS
1.0000 | ORAL_TABLET | Freq: Every evening | ORAL | Status: DC | PRN
  Administered 2013-08-05: 1 via ORAL
  Filled 2013-08-05: qty 1

## 2013-08-05 MED ORDER — AMLODIPINE BESYLATE 5 MG PO TABS
5.0000 mg | ORAL_TABLET | Freq: Every day | ORAL | Status: DC
  Administered 2013-08-06 – 2013-08-19 (×13): 5 mg via NASOGASTRIC
  Filled 2013-08-05 (×16): qty 1

## 2013-08-05 MED ORDER — CLOPIDOGREL BISULFATE 75 MG PO TABS
75.0000 mg | ORAL_TABLET | Freq: Every day | ORAL | Status: DC
  Administered 2013-08-06 – 2013-08-12 (×7): 75 mg via NASOGASTRIC
  Filled 2013-08-05 (×8): qty 1

## 2013-08-05 MED ORDER — SORBITOL 70 % SOLN
30.0000 mL | Freq: Every day | Status: DC | PRN
  Administered 2013-08-12 – 2013-08-19 (×3): 30 mL via ORAL
  Filled 2013-08-05 (×5): qty 30

## 2013-08-05 NOTE — Clinical Social Work Psychosocial (Signed)
Clinical Social Work Department BRIEF PSYCHOSOCIAL ASSESSMENT 08/05/2013  Patient:  JAKHARI, SPACE     Account Number:  [id number]     Admit date:  07/31/2013  Clinical Social Worker:  Sherre Lain  Date/Time:  08/05/2013 11:50 AM  Referred by:  Physician  Date Referred:  08/05/2013 Referred for  SNF Placement   Other Referral:   none.   Interview type:  Family Other interview type:   Spoke to pt's son, [name] Saksa [phone]), via phone regarding SNF placement as back-up to CIR.    PSYCHOSOCIAL DATA Living Status:  ALONE Admitted from facility:   Level of care:   Primary support name:  [name] M[place] Primary support relationship to patient:  CHILD, ADULT Degree of support available:   Strong support system. [name] lives across the street from pt.    CURRENT CONCERNS Current Concerns  None Noted   Other Concerns:   none.    SOCIAL WORK ASSESSMENT / PLAN CSW spoke with pt's son via phone regarding SNF placement as back-up plan to CIR. Pt's son stated that he was agreeable to CSW faxing out information to SNF in [place] and [place] counties. RNCM updated CSW with information that pt will be accepted and transferred to CIR on 08/05/2013. CSW will not send clinical information since pt has a discharge disposition. CSW signing off.   Assessment/plan status:  Psychosocial Support/Ongoing Assessment of Needs Other assessment/ plan:   none.   Information/referral to community resources:   none, pt trasnferring to CIR.    PATIENTS/FAMILYS RESPONSE TO PLAN OF CARE: Pt's son was understanding and agreeable to CSW plan of care.      Darlyn Chamber, LCSWA Clinical Social Worker [phone]

## 2013-08-05 NOTE — Progress Notes (Signed)
Rehab admissions - I spoke with son again this am.  Son is agreeable to inpatient rehab admission.  Bed available and can admit to acute inpatient rehab today.  Call me for questions.  #[phone]

## 2013-08-05 NOTE — Discharge Summary (Signed)
Family Medicine Teaching [facility] Discharge Summary  Patient name: Keith [name] Medical record number: [id number] Date of birth: 1945-09-18 Age: 67 y.o. Gender: male Date of Admission: 07/31/2013  Date of Discharge: 08/05/2013  Admitting Physician: Uvaldo Rising, MD  Primary Care Provider: Josue Hector, MD Consultants: none  Indication for Hospitalization: facial droop, weakness, slurred speech  Discharge Diagnoses/Problem List:  Patient Active Problem List   Diagnosis Date Noted  . Dysarthria due to cerebrovascular accident 08/05/2013  . Weakness 08/05/2013  . Type II or unspecified type diabetes mellitus without mention of complication, not stated as uncontrolled 08/05/2013  . Essential hypertension, benign 08/05/2013  . Depression with anxiety 08/05/2013  . Alcohol abuse 08/05/2013  . Facial droop 07/31/2013  . CVA (cerebral infarction) 07/31/2013    Disposition: CIR  Discharge Condition: stable  Discharge Exam:  General: elderly male, lying in bed in no acute distress  Cardiovascular: Regular rate/rhythm  Respiratory: Clear to auscultation bilaterally, no wheezes  Abdomen: Soft, non-tender, non-distended  Extremities: No edema  Neuro: Alert and oriented x3. Left sided facial droop, R side strength normal, LLE strength 4/5, LUE strength 3/5, dysarthria present  Psych: depressed mood  Brief Hospital Course: Keith [name] is a 67 y.o. male who presented with dysarthria, left-sided facial droop, and left-sided UE & LE weakness. PMH is significant for DM, HTN, previous smoker.   # Dysarthria and Facial, UE, LE weakness secondary to Stroke: Echo and carotid dopplers were negative for stroke source. He was started on aspirin, plavix and lipitor. He was evaluated by PT, OT and speech and it was determined that he needed to be NPO and that he should go to CIR for rehabilitation. An NG tube was placed and feeds were initiated by this route. It is hope that he can return  to PO intake safely after his CIR stay.   # DM: His blood sugar was well controlled during his hospitalization but had started to trend up with the initiation of nutrition via NG tube. He was controlled with SSI with the plan to resume metformin on discharge.   #HTN: His pressure was initially well controlled but trended up to the 160s systolic so his home amlodipine was restarted. Restart cozaar on discharge.   # Alcohol Abuse: He was initially monitored on CIWA but remained stable and this was able to be discontinued.   # Anxiety/depression: During his stay the patient expressed feeling very depressed about his new deficits and anxious about being in the hospital and not able to move about as he would like. He was started on sertraline for his mood and received occasional low dose ativan for anxiety.  Issues for Follow Up:  Depression: Monitor for response to SSRI and need for treatment adjustment.  Significant Procedures: none  Significant Labs and Imaging:   Recent Labs Lab 07/31/13 1415  WBC 7.7  HGB 15.5  HCT 44.3  PLT 257    Recent Labs Lab 07/31/13 1415  NA 134*  K 5.1  CL 94*  CO2 29  GLUCOSE 157*  BUN 20  CREATININE 0.93  CALCIUM 10.3  ALKPHOS 59  AST 33  ALT 53  ALBUMIN 4.5   Lipid Panel     Component Value Date/Time   CHOL 129 08/01/2013 0624   TRIG 92 08/01/2013 0624   HDL 53 08/01/2013 0624   CHOLHDL 2.4 08/01/2013 0624   VLDL 18 08/01/2013 0624   LDLCALC 58 08/01/2013 0624   Echo:  Left ventricle: The cavity size was  normal. Wall thickness was normal. Systolic function was normal. The estimated ejection fraction was in the range of 55% to 60%. Wall motion was normal; there were no regional wall motion abnormalities. Doppler parameters are consistent with abnormal left ventricular relaxation (grade 1 diastolic dysfunction). Pulmonary arteries: PA peak pressure: 33mm Hg (S).   Carotid dopplers:  Bilateral: mild intimal wall thickening CCA. Mild  mixed plaque origin ICA. 1-39% ICA stenosis. Verebral artery flow is antegrade.   Results/Tests Pending at Time of Discharge: none  Discharge Medications:    Medication List         acetaminophen 500 MG tablet  Commonly known as:  TYLENOL  Take 1,000 mg by mouth every 6 (six) hours as needed.     amLODipine 5 MG tablet  Commonly known as:  NORVASC  Take 5 mg by mouth daily.     aspirin 325 MG tablet  Take 1 tablet (325 mg total) by mouth daily.     atorvastatin 80 MG tablet  Commonly known as:  LIPITOR  1 tablet (80 mg total) by Per NG tube route daily at 6 PM.     clopidogrel 75 MG tablet  Commonly known as:  PLAVIX  1 tablet (75 mg total) by Per NG tube route daily with breakfast.     feeding supplement (GLUCERNA 1.2 CAL) Liqd  Place 1,000 mLs into feeding tube continuous.     fluticasone 50 MCG/ACT nasal spray  Commonly known as:  FLONASE  Place 2 sprays into both nostrils 2 (two) times daily.     folic acid 1 MG tablet  Commonly known as:  FOLVITE  Place 1 tablet (1 mg total) into feeding tube daily.     HYDROcodone-acetaminophen 5-325 MG per tablet  Commonly known as:  NORCO/VICODIN  Take 1 tablet by mouth every 6 (six) hours as needed for moderate pain.     ibuprofen 600 MG tablet  Commonly known as:  ADVIL,MOTRIN  Take 600 mg by mouth every 8 (eight) hours as needed for moderate pain.     LORazepam 0.5 MG tablet  Commonly known as:  ATIVAN  Take 1 tablet (0.5 mg total) by mouth every 6 (six) hours as needed for anxiety.     losartan 100 MG tablet  Commonly known as:  COZAAR  Take 100 mg by mouth daily.     metFORMIN 500 MG tablet  Commonly known as:  GLUCOPHAGE  Take 500 mg by mouth 2 (two) times daily with a meal.     multivitamin with minerals Tabs tablet  Place 1 tablet into feeding tube daily.     sertraline 50 MG tablet  Commonly known as:  ZOLOFT  Take 1 tablet (50 mg total) by mouth daily.     thiamine 100 MG tablet  Take 1 tablet (100  mg total) by mouth daily.       Discharge Instructions: Please refer to Patient Instructions section of EMR for full details.  Patient was counseled important signs and symptoms that should prompt return to medical care, changes in medications, dietary instructions, activity restrictions, and follow up appointments.   Follow-Up Appointments: Follow-up Information   Follow up with Josue Hector, MD.   Specialty:  Family Medicine   Contact information:   [address] [place] [address] [phone]      Beverely Low, MD 08/05/2013, 4:20 PM PGY-1, [facility]

## 2013-08-05 NOTE — H&P (Signed)
Physical Medicine and Rehabilitation Admission H&P      Chief Complaint   Patient presents with   .  Facial Droop    : Chief complaint: Facial droop   HPI: Keith [name] is a 67 y.o. male with history of DM, HTN, who was admitted on 07/31/13 with left facial droop with dysarthria and one week history of imbalance as diplopia X 2 days. MRI/MRA brain done revealing two small acute brainstem infarcts in-right paracentral pons, and central to left paracentral medulla possibly involving left pyramid, no stenosis. 2D echo with EF 55-60% and grade 1 diastolic dysfunction. Carotid dopplers with 1-39% ICA stenosis. Neurology services consulted maintained on aspirin and Plavix therapy for CVA prophylaxis. Subcutaneous heparin added for DVT prophylaxis.Marland Kitchen MBS done 08/01/13 alternative nutritional means and NPO recommended with nasogastric tube feeds for nutritional support. He continues with balance deficits, visual deficits, with severe dysphagia and delayed processing. MD, PT, OT, ST recommending CIR. patient was felt to be a good candidate for inpatient rehabilitation services was admitted for comprehensive rehabilitation program  Complains of difficulty swallowing secretions but feels that this is improving. Earlier in hospitalization complained of left hand numbness but this is feeling better. Denies any facial numbness Bordering this of vision but not truly double vision per his report   ROS Review of Systems   Constitutional: Positive for malaise/fatigue.   Eyes:   Seeing black things.  Musculoskeletal: Positive for neck pain.   Neurological: Positive for weakness and headaches (past 2-3 weeks    Past Medical History   Diagnosis  Date   .  Diabetes mellitus without complication     .  Hypertension      Past Surgical History   Procedure  Laterality  Date   .  Prostate surgery       .  Knee surgery        No family history on file. Social History: reports that he has quit smoking. He  does not have any smokeless tobacco history on file. He reports that he drinks alcohol. He reports that he does not use illicit drugs. Allergies: No Known Allergies Medications Prior to Admission   Medication  Sig  Dispense  Refill   .  acetaminophen (TYLENOL) 500 MG tablet  Take 1,000 mg by mouth every 6 (six) hours as needed.         Marland Kitchen  amLODipine (NORVASC) 5 MG tablet  Take 5 mg by mouth daily.         .  fluticasone (FLONASE) 50 MCG/ACT nasal spray  Place 2 sprays into both nostrils 2 (two) times daily.         Marland Kitchen  HYDROcodone-acetaminophen (NORCO/VICODIN) 5-325 MG per tablet  Take 1 tablet by mouth every 6 (six) hours as needed for moderate pain.         Marland Kitchen  ibuprofen (ADVIL,MOTRIN) 600 MG tablet  Take 600 mg by mouth every 8 (eight) hours as needed for moderate pain.         Marland Kitchen  losartan (COZAAR) 100 MG tablet  Take 100 mg by mouth daily.         .  metFORMIN (GLUCOPHAGE) 500 MG tablet  Take 500 mg by mouth 2 (two) times daily with a meal.            Home: Home Living Family/patient expects to be discharged to:: Private residence Living Arrangements: Alone Available Help at Discharge: Family Type of Home: House Home Access: Stairs to enter [employer]  of Steps: 6 Entrance Stairs-Rails: Right;Left    Functional History:   Functional Status:   Mobility: Bed Mobility Bed Mobility: Rolling Left;Left Sidelying to Sit;Sitting - Scoot to [employer] of Bed;Sit to Sidelying Left;Scooting to [facility] Rolling Left: 4: Min assist Left Sidelying to Sit: 3: Mod assist;With rails;HOB elevated Supine to Sit: With rails;4: Min assist Sitting - Scoot to [place] of Bed: 4: Min assist (with chuck pad) Sit to Supine: 4: Min assist Scooting to HOB: 4: Min assist;With rail (reversed incline position of bed) Transfers Transfers: Sit to Stand;Stand to Sit Sit to Stand: 3: Mod assist;Without upper extremity assist;From bed Sit to Stand: Patient Percentage: 60% Stand to Sit: 3: Mod assist;With upper  extremity assist;To chair/3-in-1 Stand to Sit: Patient Percentage: 70% Ambulation/Gait Ambulation/Gait Assistance: 3: Mod assist Ambulation/Gait: Patient Percentage: 60% Ambulation Distance (Feet): 100 Feet (x 2) Assistive device: Rolling walker Ambulation/Gait Assistance Details: Posterior tendency during ambulation, Cues for upright posture, LLE buckling present today, pt with decreased ambulation compared to prior sessions, performed 4 trials of approximately 10 steps (fwd and bkward) in room with seated breaks in between Gait Pattern: Step-through pattern;Trunk flexed;Lateral trunk lean to right;Decreased stride length;Trunk rotated posteriorly on right;Decreased stance time - right Gait velocity: decreased Stairs: No   ADL: ADL Upper Body Bathing: Simulated;Moderate assistance Where Assessed - Upper Body Bathing: Supported sitting Lower Body Bathing: Simulated;+2 Total assistance Where Assessed - Lower Body Bathing: Supported sit to stand Upper Body Dressing: Simulated;Maximal assistance Where Assessed - Upper Body Dressing: Supported sitting Lower Body Dressing: Performed;+2 Total assistance Where Assessed - Lower Body Dressing: Supported sit to Pharmacist, hospital: Electronics engineer Method: Surveyor, minerals:  (simulated to the bedside chair) Equipment Used: Gait belt (oxygen) Transfers/Ambulation Related to ADLs: Pt required max assist for stand pivot transfers.  Ambulated short distance around the foot of the bed with max assist hand held as well. ADL Comments: Pt worked on [employer] exercises in supine for the left shoulder, elbow, and digits prior to sitting EOB.  Therapist also educated pt's daughter-in-law on AAROM exercises as well, however they were not returned demonstrated this session.  With iniital sitting pt demonstrates increased LOB to the right requiring min assist to self correct.  Sat EOB for 10 mins while having pt  reach forward and to the sides to work on trunk control and strengthening.  Overall min assist to regain balance when reaching way forward or to the right side.  Frequent LOB posteriorly with initial standing.  Pt tends to keep his head tilted to the right with slight rotation to the right and cervical flexion as well.  Provided education to pt and daughter-in-law as well on reaching, grasp, and replace using the LUE to help increase functional use.   Cognition: Cognition Overall Cognitive Status: Impaired/Different from baseline Orientation Level: Oriented X4 Attention: Sustained Sustained Attention: Impaired Sustained Attention Impairment: Verbal basic (pt internally distracted/concerns re: business and his impairments impacting his attention) Memory: Impaired Awareness: Appears intact (aware to physical/speech/swallow deficits) Problem Solving: Appears intact (pt able to use communication board - showing problem solving\) Cognition Arousal/Alertness: Awake/alert Behavior During Therapy: Flat affect Overall Cognitive Status: Impaired/Different from baseline Area of Impairment: Orientation Orientation Level: Place Current Attention Level: Sustained Memory: Decreased short-term memory Awareness: Intellectual Problem Solving: Slow processing;Difficulty sequencing General Comments: Cognition improving compared to prior sessions   Physical Exam: Blood pressure 144/83, pulse 66, temperature 97.4 F (36.3 C), temperature source Oral, resp. rate 18, height 6\' 1"  (  1.854 m), weight 81.194 kg (179 lb), SpO2 97.00%. Physical Exam Constitutional: He is oriented to person, place, and time. He appears well-developed and well-nourished.   HENT:   Head: Normocephalic and atraumatic.   Eyes: Conjunctivae are normal. Pupils are equal, round, and reactive to light.   Neck: Normal range of motion. Neck supple.   Cardiovascular: Normal rate and regular rhythm.   Respiratory: Effort normal and breath  sounds normal. No respiratory distress. He has no wheezes.   GI: Soft. Bowel sounds are normal.  Musculoskeletal: He exhibits no edema.  Neurological: He is alert and oriented to person, place, and time.  Dysarthric speech with occasional wet voice. Follows commands without difficulty Left facial droop. Speech very dysarthric. Tends to look to the right  Skin: Skin is warm and dry   Motor strength is 5/5 in the right deltoid, bicep, tricep, grip 4/5 in the right hip flexor knee extensor ankle dorsiflexor plantar flexor 2 minus/5 in the left deltoid, bicep, tricep, grip 3 minus in the left hip flexor, knee extensors, ankle dorsiflexion plantar Neuro:  Eyes without evidence of nystagmus    Cranial nerves II- Visual fields are intact to confrontation testing, no blurring of vision III- no evidence of ptosis, upward, downward and medial gaze intact IV- no vertical diplopia or head tilt V- no facial numbness or masseter weakness VI- no pupil abduction weakness VII- no facial droop, good lid closure VII- normal auditory acuity IX- + pharygeal weakness,  X- + pharyngeal weakness, + hoarseness XI- no trap or SCM weakness XII- no glossal weakness   Results for orders placed during the hospital encounter of 07/31/13 (from the past 48 hour(s))   GLUCOSE, CAPILLARY     Status: Abnormal     Collection Time      08/03/13 11:06 AM       Result  Value  Range     Glucose-Capillary  111 (*)  70 - 99 mg/dL   GLUCOSE, CAPILLARY     Status: Abnormal     Collection Time      08/03/13  4:34 PM       Result  Value  Range     Glucose-Capillary  105 (*)  70 - 99 mg/dL   GLUCOSE, CAPILLARY     Status: Abnormal     Collection Time      08/03/13  8:15 PM       Result  Value  Range     Glucose-Capillary  112 (*)  70 - 99 mg/dL   GLUCOSE, CAPILLARY     Status: Abnormal     Collection Time      08/04/13 12:03 AM       Result  Value  Range     Glucose-Capillary  103 (*)  70 - 99 mg/dL   GLUCOSE,  CAPILLARY     Status: Abnormal     Collection Time      08/04/13  4:05 AM       Result  Value  Range     Glucose-Capillary  116 (*)  70 - 99 mg/dL     Comment 1  Notify RN        Comment 2  Documented in Chart      GLUCOSE, CAPILLARY     Status: Abnormal     Collection Time      08/04/13  8:25 AM       Result  Value  Range     Glucose-Capillary  144 (*)  70 - 99 mg/dL   GLUCOSE, CAPILLARY     Status: Abnormal     Collection Time      08/04/13 11:44 AM       Result  Value  Range     Glucose-Capillary  140 (*)  70 - 99 mg/dL   GLUCOSE, CAPILLARY     Status: Abnormal     Collection Time      08/04/13  4:16 PM       Result  Value  Range     Glucose-Capillary  148 (*)  70 - 99 mg/dL     Comment 1  Documented in Chart      GLUCOSE, CAPILLARY     Status: Abnormal     Collection Time      08/04/13  8:06 PM       Result  Value  Range     Glucose-Capillary  159 (*)  70 - 99 mg/dL   GLUCOSE, CAPILLARY     Status: Abnormal     Collection Time      08/05/13 12:08 AM       Result  Value  Range     Glucose-Capillary  159 (*)  70 - 99 mg/dL   GLUCOSE, CAPILLARY     Status: Abnormal     Collection Time      08/05/13  3:35 AM       Result  Value  Range     Glucose-Capillary  180 (*)  70 - 99 mg/dL   GLUCOSE, CAPILLARY     Status: Abnormal     Collection Time      08/05/13  8:27 AM       Result  Value  Range     Glucose-Capillary  192 (*)  70 - 99 mg/dL    Dg Abd Portable 1v   08/03/2013   CLINICAL DATA:  Feeding tube placement.  EXAM: PORTABLE ABDOMEN - 1 VIEW  COMPARISON:  None.  FINDINGS: A small bore feeding tube is identified with tip overlying the proximal -mid stomach.  Oral contrast within the colon is present.  No other significant abnormalities identified.  IMPRESSION: Small bore feeding tube with tip overlying the proximal-mid stomach.   Electronically Signed   By: Laveda Abbe M.D.   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 Patient is admitted to receive collaborative, interdisciplinary care between the physiatrist, rehab nursing staff, and therapy team. Patient's level of medical complexity and substantial therapy needs in context of that medical necessity cannot be provided at a lesser intensity of care such as a SNF. Patient has experienced substantial functional loss from his/her baseline which was documented above under the "Functional History" and "Functional Status" headings.  Judging by the patient's diagnosis, physical exam, and functional history, the patient has potential for functional progress which will result in measurable gains while on inpatient rehab.  These gains will be of substantial and practical use upon discharge  in facilitating mobility and self-care at the household level. Physiatrist will provide 24 hour management of medical needs as well as oversight of the therapy plan/treatment and provide guidance as appropriate regarding the interaction of the two. 24 hour rehab nursing will assist with bladder management, bowel management, safety, skin/wound care, disease management, medication administration, pain management and patient education  and help integrate therapy concepts, techniques,education, etc. PT will assess and treat  for/with: pre gait, gait training, endurance , safety, equipment, neuromuscular re education.   Goals are: Supervision mobility . OT will assess and treat for/with: ADLs, Cognitive perceptual skills, Neuromuscular re education, safety, endurance, equipment.   Goals are: Sup ADL. SLP will assess and treat for/with: swallowing, dysarthria.  Goals are: adequate safe caloric and fluid intake po. Case Management and Social Worker will assess and treat for psychological issues and discharge planning. Team conference will be held weekly to assess progress toward goals and to determine barriers to discharge. Patient will  receive at least 3 hours of therapy per day at least 5 days per week. ELOS: 10-12 days        Prognosis:  good     Medical Problem List and Plan: 1. Thrombotic left medullary, right paracentral pontine infarct 2. DVT Prophylaxis/Anticoagulation: Subcutaneous heparin. Monitor platelet counts and any signs of bleeding 3. Pain Management: Tylenol as needed 4. Mood: Zoloft 50 mg daily, Ativan 0.5 mg every 4 as needed anxiety 5. Neuropsych: This patient is capable of making decisions on his own behalf. 6. Dysphagia. Patient currently n.p.o. with nasogastric tube feeds. Followup speech therapy 7. Hypertension. Norvasc 5 mg daily. Monitor with increased mobility 8. Hyperlipidemia. Lipitor 9. Diabetes mellitus with peripheral neuropathy. Hemoglobin A1c 7.4. Continue sliding scale insulin. Patient on Glucophage 500 mg twice a day prior to admission. Will resume as tolerated   Erick Colace M.D. [facility] FAAPM&R (Sports Med, Neuromuscular Med) Diplomate Am Board of Electrodiagnostic Med Diplomate Am Board of Pain Medicine Fellow Am Board of Interventional Pain Physicians 08/05/2013

## 2013-08-05 NOTE — Progress Notes (Signed)
Family Medicine Teaching Service Daily Progress Note Intern Pager: [phone]  Patient name: Keith [name] Medical record number: [id number] Date of birth: [birth year] Age: 67 y.o. Gender: male  Primary Care Provider: Josue Hector, MD Consultants: None Code Status: Full Code  Pt Overview and Major Events to Date:   Assessment and Plan: Keith [name] is a 67 y.o. male presenting with dysarthria, left-sided facial droop, and left-sided UE & LE weakness. PMH is significant for DM, HTN, previous smoker.   # Dysarthria and Facial, UE, LE weakness secondary to Stroke  Has been on ASA. CT shows atherosclerotic and physiologic intracranial calcifications w/o signs of acute stroke or hemorrhage. EKG showed NSR. I-stat Troponin neg in ED. CBC & CMET wnl. Lipid panel unremarkable.  - MRI: revealed two small acute brainstem infarcts: right paracentral pons, central to left paracentral medulla, possibly involving the left pyramid.  - Has microhemorrhage per neuro this is likely sequela of hypertension and does not change management - Carotid doppler showed 1-39% stenosis and mild mixed plaque of the ICA - A1C 7.4, TSH 1.188 - Echo: grade 1 dyastolic dysfunction, no thrombus - ASA daily; Will initiate Plavix once able to take PO  - Will start statin when able to take PO  - PT/OT/SLP recommending CIR, patient amenable, CIR has accepted him for decreased burden of care, anticipate SNF following this - UDS positive for benzos and opiates (both given here) - failed swallow eval, speech predicting safe for po in 7-10 days - has NG with glucerna at 46mL/hour (running at goal, tolerating well)  # DM  - A1c 7.4 - Holding Metformin while inpatient - CBGs Q 4 hours; No insulin at this time but will continue to monitor and may need insulin now that he is receiving adequate nutrition   #HTN: SBP 160s and trending up   - Continue holding home Cozaar and Norvasc  - Will restart if needed or at discharge   #  Alcohol Abuse  - Reports drinking 4-5 beers daily; last drink ~ 1 wk prior to admission; Ethanol level neg on admission  - Discontinued CIWA protocol   # Anxiety/depression: reports claustraphobia with multiple wires and tubes, less anxious and more optimistic on exam today  - ativan 0.5mg  prn anxiety (recieved x1 yesterday) - sertraline started 12/3  FEN/GI: NS @ ; Glucerna per NG at 29mL/hr Prophylaxis: Heparin subq  Disposition: Medically stable and ready for discharge to CIR when bed is available  Subjective: Patient is feeling better today, denies anxiety and still feeling down about his new deficits but wants to go get stronger at rehab  Objective: Temp:  [97.4 F (36.3 C)-98.5 F (36.9 C)] 98 F (36.7 C) (12/04 0943) Pulse Rate:  [65-77] 68 (12/04 0943) Resp:  [18-20] 18 (12/04 0943) BP: (136-169)/(69-85) 161/80 mmHg (12/04 0943) SpO2:  [94 %-98 %] 94 % (12/04 0943) Weight:  [179 lb (81.194 kg)] 179 lb (81.194 kg) (12/04 0500)  Physical Exam: General: elderly male, lying in bed in no acute distress  Cardiovascular: Regular rate/rhythm Respiratory: Clear to auscultation bilaterally, no wheezes Abdomen: Soft, non-tender, non-distended Extremities: No edema Neuro: Alert and oriented x3. Left sided facial droop, R side strength normal, LLE strength 4/5, LUE strength 3/5, dysarthria present Psych: reports anxiety and appears depressed  Laboratory:  Recent Labs Lab 07/31/13 1415  WBC 7.7  HGB 15.5  HCT 44.3  PLT 257    Recent Labs Lab 07/31/13 1415  NA 134*  K 5.1  CL 94*  CO2  29  BUN 20  CREATININE 0.93  CALCIUM 10.3  PROT 7.4  BILITOT 0.5  ALKPHOS 59  ALT 53  AST 33  GLUCOSE 157*   Lipid Panel     Component Value Date/Time   CHOL 129 08/01/2013 0624   TRIG 92 08/01/2013 0624   HDL 53 08/01/2013 0624   CHOLHDL 2.4 08/01/2013 0624   VLDL 18 08/01/2013 0624   LDLCALC 58 08/01/2013 0624    Imaging/Diagnostic Tests: Echo:  Left ventricle:  The cavity size was normal. Wall thickness was normal. Systolic function was normal. The estimated ejection fraction was in the range of 55% to 60%. Wall motion was normal; there were no regional wall motion abnormalities. Doppler parameters are consistent with abnormal left ventricular relaxation (grade 1 diastolic dysfunction). Pulmonary arteries: PA peak pressure: 33mm Hg (S).  Carotid dopplers: Bilateral: mild intimal wall thickening CCA. Mild mixed plaque origin ICA. 1-39% ICA stenosis. Verebral artery flow is antegrade.  Beverely Low, MD 08/05/2013, 12:35 PM PGY-1, [facility] Family Medicine FPTS Intern pager: [phone], text pages welcome

## 2013-08-05 NOTE — PMR Pre-admission (Signed)
PMR Admission Coordinator Pre-Admission Assessment  Patient: Keith [name] is an 67 y.o., male MRN: [id number] DOB: 1946-07-11 Height: 6\' 1"  (185.4 cm) Weight: 81.194 kg (179 lb)              Insurance Information HMO:      PPO:       PCP:       IPA:       80/20:       OTHER:   PRIMARY: Medicare A/B      Policy#: [id number]      Subscriber: Jenetta Loges CM Name:        Phone#:       Fax#:   Pre-Cert#:        Employer: Self employed Benefits:  Phone #:       Name: Armed forces technical officer. Date: 10/03/09     Deduct: $1216      Out of Pocket Max: none      Life Max: unlimited CIR: 100%      SNF: 100 days  LBD=07/13/13 Outpatient: 80%     Co-Pay: 20% Home Health: 100%      Co-Pay: none DME: 80%     Co-Pay: 20% Providers: patient's choice  SECONDARY: [employer]      Policy#: [id number]      Subscriber: Jenetta Loges CM Name:        Phone#:       Fax#:   Pre-Cert#:        Employer: Self employed Benefits:  Phone #: [phone]     Name:   Eff. Date:       Deduct:        Out of Pocket Max:        Life Max:   CIR:        SNF:   Outpatient:       Co-Pay:   Home Health:        Co-Pay:   DME:       Co-Pay:     Emergency Contact Information Contact Information   Name Relation Home Work Mobile   [place] Son [phone]  [phone]     Current Medical History  Patient Admitting Diagnosis:  Left medullary, right paracentral pontine infarcts    History of Present Illness: A 67 y.o. male with history of DM, HTN, who was admitted on 07/31/13 with left facial droop with dysarthria and one week history of imbalance as diplopia X 2 days. MRI/MRA brain done revealing two small acute brainstem infarcts in-right paracentral pons, and central to left paracentral medulla possibly involving left pyramid, no stenosis. 2D echo with EF 55-60% and grade 1 diastolic dysfunction. Carotid dopplers with 1-39% ICA stenosis. Neurology services consulted maintained on aspirin and Plavix therapy for CVA prophylaxis.  Subcutaneous heparin added for DVT prophylaxis.Marland Kitchen MBS done 08/01/13 alternative nutritional means and NPO recommended with nasogastric tube feeds for nutritional support. He continues with balance deficits, visual deficits, with severe dysphagia and delayed processing. MD, PT, OT, ST recommending CIR. Patient was felt to be a good candidate for inpatient rehabilitation services and will be admitted for comprehensive rehabilitation program  Total: 8=NIH  Past Medical History  Past Medical History  Diagnosis Date  . Diabetes mellitus without complication   . Hypertension     Family History  family history is not on file.  Prior Rehab/Hospitalizations:  None   Current Medications  Current facility-administered medications:0.9 %  sodium chloride infusion, , Intravenous, Continuous, Jacquelin Hawking, MD, Last Rate: 100 mL/hr  at 08/05/13 0700;  amLODipine (NORVASC) tablet 5 mg, 5 mg, Per NG tube, Daily, Beverely Low, MD, 5 mg at 08/05/13 9604;  aspirin suppository 300 mg, 300 mg, Rectal, Daily, Wenda Low, MD, 300 mg at 08/01/13 1118;  aspirin tablet 325 mg, 325 mg, Oral, Daily, Wenda Low, MD, 325 mg at 08/05/13 0934 atorvastatin (LIPITOR) tablet 80 mg, 80 mg, Per NG tube, q1800, Beverely Low, MD, 80 mg at 08/04/13 1701;  clopidogrel (PLAVIX) tablet 75 mg, 75 mg, Per NG tube, Q breakfast, Beverely Low, MD, 75 mg at 08/05/13 0900;  feeding supplement (GLUCERNA 1.2 CAL) liquid 1,000 mL, 1,000 mL, Per Tube, Continuous, Ailene Ards, RD, Last Rate: 65 mL/hr at 08/05/13 0834, 1,000 mL at 08/05/13 0834 fluticasone (FLONASE) 50 MCG/ACT nasal spray 2 spray, 2 spray, Each Nare, BID, Wenda Low, MD, 2 spray at 08/03/13 2319;  folic acid (FOLVITE) tablet 1 mg, 1 mg, Per Tube, Daily, Beverely Low, MD, 1 mg at 08/05/13 0934;  heparin injection 5,000 Units, 5,000 Units, Subcutaneous, Q8H, Wenda Low, MD, 5,000 Units at 08/05/13 0513;  insulin aspart (novoLOG) injection 0-15 Units, 0-15 Units, Subcutaneous,  Q4H, Beverely Low, MD, 3 Units at 08/05/13 1141 LORazepam (ATIVAN) injection 0.5 mg, 0.5 mg, Intravenous, Q4H PRN, Beverely Low, MD, 0.5 mg at 08/05/13 1146;  morphine 2 MG/ML injection 2 mg, 2 mg, Intravenous, Q4H PRN, Jacquelin Hawking, MD, 2 mg at 08/05/13 0509;  multivitamin with minerals tablet 1 tablet, 1 tablet, Per Tube, Daily, Beverely Low, MD, 1 tablet at 08/05/13 0934;  senna-docusate (Senokot-S) tablet 1 tablet, 1 tablet, Oral, QHS PRN, Wenda Low, MD sertraline (ZOLOFT) tablet 50 mg, 50 mg, Oral, Daily, Beverely Low, MD, 50 mg at 08/05/13 5409;  thiamine (B-1) injection 100 mg, 100 mg, Intravenous, Daily, Wenda Low, MD, 100 mg at 08/03/13 1113;  thiamine (VITAMIN B-1) tablet 100 mg, 100 mg, Oral, Daily, Wenda Low, MD, 100 mg at 08/05/13 8119  Patients Current Diet: NPO  Precautions / Restrictions Precautions Precautions: Fall Precaution Comments: right side head rotation and tilt, dysarthria, left hemiparesis Restrictions Weight Bearing Restrictions: No   Prior Activity Level Community (5-7x/wk): Went out daily.  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, newspaper / Equipment Home Assistive Devices/Equipment: None  Prior Functional Level Prior Function Level of Independence: Independent  Current Functional Level Cognition  Overall Cognitive Status: Impaired/Different from baseline Current Attention Level: Sustained Orientation Level: Oriented X4 General Comments: Cognition improving compared to prior sessions Attention: Sustained Sustained Attention: Impaired Sustained Attention Impairment: Verbal basic (pt internally distracted/concerns re: business and his impairments impacting his attention) Memory: Impaired Awareness: Appears intact (aware to physical/speech/swallow deficits) Problem Solving: Appears intact (pt able to use communication board - showing problem solving\)    Extremity Assessment (includes Sensation/Coordination)           ADLs  Upper Body Bathing: Simulated;Moderate assistance Where Assessed - Upper Body Bathing: Supported sitting Lower Body Bathing: Simulated;+2 Total assistance Lower Body Bathing: Patient Percentage: 50% Where Assessed - Lower Body Bathing: Supported sit to stand Upper Body Dressing: Simulated;Maximal assistance Where Assessed - Upper Body Dressing: Supported sitting Lower Body Dressing: Performed;+2 Total assistance Lower Body Dressing: Patient Percentage: 50% Where Assessed - Lower Body Dressing: Supported sit to Pharmacist, hospital: Electronics engineer: Patient Percentage: 50% Statistician Method: Surveyor, minerals:  (simulated to the bedside chair) Toileting - Clothing Manipulation and Hygiene: Simulated;Maximal assistance Where Assessed - Engineer, mining and Hygiene: Other (  comment) (sit to stand from the EOB) Equipment Used: Gait belt (oxygen) Transfers/Ambulation Related to ADLs: Pt required max assist for stand pivot transfers.  Ambulated short distance around the foot of the bed with max assist hand held as well. ADL Comments: Pt worked on [employer] exercises in supine for the left shoulder, elbow, and digits prior to sitting EOB.  Therapist also educated pt's daughter-in-law on AAROM exercises as well, however they were not returned demonstrated this session.  With iniital sitting pt demonstrates increased LOB to the right requiring min assist to self correct.  Sat EOB for 10 mins while having pt reach forward and to the sides to work on trunk control and strengthening.  Overall min assist to regain balance when reaching way forward or to the right side.  Frequent LOB posteriorly with initial standing.  Pt tends to keep his head tilted to the right with slight rotation to the right and cervical flexion as well.  Provided education to pt and daughter-in-law as well on reaching, grasp, and replace using the LUE to help  increase functional use.    Mobility  Bed Mobility: Rolling Left;Left Sidelying to Sit;Sitting - Scoot to [place] of Bed;Sit to Sidelying Left;Scooting to [facility] Rolling Left: 4: Min assist Left Sidelying to Sit: 3: Mod assist;With rails;HOB elevated Supine to Sit: With rails;4: Min assist Sitting - Scoot to [place] of Bed: 4: Min assist (with chuck pad) Sit to Supine: 4: Min assist Scooting to HOB: 4: Min assist;With rail (reversed incline position of bed)    Transfers  Transfers: Sit to Stand;Stand to Sit Sit to Stand: 3: Mod assist;Without upper extremity assist;From bed Sit to Stand: Patient Percentage: 60% Stand to Sit: 3: Mod assist;With upper extremity assist;To chair/3-in-1 Stand to Sit: Patient Percentage: 70%    Ambulation / Gait / Stairs / Psychologist, prison and probation services  Ambulation/Gait Ambulation/Gait Assistance: 3: Mod assist Ambulation/Gait: Patient Percentage: 60% Ambulation Distance (Feet): 100 Feet (x 2) Assistive device: Rolling walker Ambulation/Gait Assistance Details: Posterior tendency during ambulation, Cues for upright posture, LLE buckling present today, pt with decreased ambulation compared to prior sessions, performed 4 trials of approximately 10 steps (fwd and bkward) in room with seated breaks in between Gait Pattern: Step-through pattern;Trunk flexed;Lateral trunk lean to right;Decreased stride length;Trunk rotated posteriorly on right;Decreased stance time - right Gait velocity: decreased Stairs: No    Posture / Balance Static Sitting Balance Static Sitting - Balance Support: Right upper extremity supported Static Sitting - Level of Assistance: 4: Min assist Static Sitting - Comment/# of Minutes: Occasional min assist to return to midline, VCs for correction of positioning, able to self correct at times without assist Dynamic Sitting Balance Dynamic Sitting - Balance Support: No upper extremity supported Dynamic Sitting - Level of Assistance: 4: Min assist Static  Standing Balance Static Standing - Balance Support: Bilateral upper extremity supported;During functional activity Static Standing - Level of Assistance: 3: Mod assist Static Standing - Comment/# of Minutes: (A) to promote midline and prevent posterior and right sided lean.  Pt relies heavily with LE against bed to maintain balance.  Dynamic Standing Balance Dynamic Standing - Balance Support: Bilateral upper extremity supported Dynamic Standing - Level of Assistance: 3: Mod assist Dynamic Standing - Balance Activities: Lateral lean/weight shifting (standing march)    Special needs/care consideration BiPAP/CPAP No CPM No Continuous Drip IV 0.9% NS 100 ml/hr Dialysis No       Life Vest No Oxygen No Special Bed No Trach Size No Wound Vac (area) No  Skin No                               Bowel mgmt: Last documented BM 08/01/13 Bladder mgmt: Voiding WDL Diabetic mgm Yes, on oral medications at home    Previous Home Environment Living Arrangements: Alone Available Help at Discharge: Family Type of Home: House Home Layout: One level Home Access: Stairs to enter Entrance Stairs-Rails: Doctor, general practice of Steps: 6 Home Care Services: No  Discharge Living Setting Plans for Discharge Living Setting: Other (Comment) (Likely will need SNF at discharge from rehab.) Type of Home at Discharge: Skilled Nursing Facility Does the patient have any problems obtaining your medications?: No  Social/Family/Support Systems Patient Roles: Parent (Has a son and a daughter.) Contact Information: [name] - son Anticipated Caregiver: Anticipate the need for SNF since son works fulltime Anticipated Industrial/product designer Information: [name] (h) [phone] (c) [phone] Ability/Limitations of Caregiver: Son works Community education officer.  Daughter does not come around much. Caregiver Availability: Other (Comment) (Likely will need SNF due to limited support.) Discharge Plan Discussed with  Primary Caregiver: Yes Is Caregiver In Agreement with Plan?: Yes Does Caregiver/Family have Issues with Lodging/Transportation while Pt is in Rehab?: No  Goals/Additional Needs Patient/Family Goal for Rehab: PT/OT/ST S/Min assist goals Expected length of stay: 20-25 days Cultural Considerations: None Dietary Needs: NPO with tube feedings Equipment Needs: TBD Pt/Family Agrees to Admission and willing to participate: Yes Program Orientation Provided & Reviewed with Pt/Caregiver Including Roles  & Responsibilities: Yes  Decrease burden of Care through IP rehab admission: Diet advancement, Decrease number of caregivers and Bowel and bladder program  Possible need for SNF placement upon discharge: Yes, anticipate need for SNF placement.    Patient Condition: This patient's medical and functional status has changed since the consult dated: 08/03/13 in which the Rehabilitation Physician determined and documented that the patient's condition is appropriate for intensive rehabilitative care in an inpatient rehabilitation facility. See "History of Present Illness" (above) for medical update. Functional changes are: Currently requiring mod assist for stand pivot transfers.  Patient's medical and functional status update has been discussed with the Rehabilitation physician and patient remains appropriate for inpatient rehabilitation. Will admit to inpatient rehab today.  Preadmission Screen Completed By:  Trish Mage, 08/05/2013 12:45 PM ______________________________________________________________________   Discussed status with Dr. Riley Kill on 08/05/13 at 1222 and received telephone approval for admission today.  Admission Coordinator:  Trish Mage, time1245/Date12/04/14

## 2013-08-05 NOTE — Progress Notes (Signed)
FMTS Attending Daily Note:  Jeff Teri Diltz MD  [phone] pager  Family Practice pager:  [phone] I have discussed this patient with the resident Dr. Adamo.   I agree with their findings, assessment, and care plan  

## 2013-08-06 ENCOUNTER — Inpatient Hospital Stay (HOSPITAL_COMMUNITY): Payer: No Typology Code available for payment source | Admitting: Rehabilitation

## 2013-08-06 ENCOUNTER — Inpatient Hospital Stay (HOSPITAL_COMMUNITY): Payer: No Typology Code available for payment source | Admitting: Occupational Therapy

## 2013-08-06 ENCOUNTER — Inpatient Hospital Stay (HOSPITAL_COMMUNITY): Payer: Medicare Other

## 2013-08-06 DIAGNOSIS — I6330 Cerebral infarction due to thrombosis of unspecified cerebral artery: Secondary | ICD-10-CM

## 2013-08-06 DIAGNOSIS — I69991 Dysphagia following unspecified cerebrovascular disease: Secondary | ICD-10-CM

## 2013-08-06 DIAGNOSIS — I69993 Ataxia following unspecified cerebrovascular disease: Secondary | ICD-10-CM

## 2013-08-06 LAB — COMPREHENSIVE METABOLIC PANEL
Albumin: 3.1 g/dL — ABNORMAL LOW (ref 3.5–5.2)
Alkaline Phosphatase: 51 U/L (ref 39–117)
BUN: 9 mg/dL (ref 6–23)
Chloride: 102 mEq/L (ref 96–112)
Creatinine, Ser: 0.6 mg/dL (ref 0.50–1.35)
GFR calc Af Amer: >90 mL/min (ref >90)
GFR calc non Af Amer: >90 mL/min (ref >90)
Glucose, Bld: 168 mg/dL — ABNORMAL HIGH (ref 70–99)
Potassium: 3.6 mEq/L (ref 3.5–5.1)
Sodium: 137 mEq/L (ref 135–145)
Total Bilirubin: 0.4 mg/dL (ref 0.3–1.2)

## 2013-08-06 LAB — CBC WITH DIFFERENTIAL/PLATELET
Basophils Relative: 0 % (ref 0–1)
Eosinophils Absolute: 0.2 10*3/uL (ref 0.0–0.7)
HCT: 35.5 % — ABNORMAL LOW (ref 39.0–52.0)
Hemoglobin: 12.7 g/dL — ABNORMAL LOW (ref 13.0–17.0)
Lymphocytes Relative: 23 % (ref 12–46)
Lymphs Abs: 1.6 10*3/uL (ref 0.7–4.0)
MCH: 33.9 pg (ref 26.0–34.0)
MCHC: 35.8 g/dL (ref 30.0–36.0)
Monocytes Absolute: 0.9 10*3/uL (ref 0.1–1.0)
Monocytes Relative: 14 % — ABNORMAL HIGH (ref 3–12)
Neutro Abs: 4.2 10*3/uL (ref 1.7–7.7)
Neutrophils Relative %: 61 % (ref 43–77)
Platelets: 184 10*3/uL (ref 150–400)
RBC: 3.75 MIL/uL — ABNORMAL LOW (ref 4.22–5.81)
RDW: 11.8 % (ref 11.5–15.5)
WBC: 6.9 10*3/uL (ref 4.0–10.5)

## 2013-08-06 LAB — GLUCOSE, CAPILLARY
Glucose-Capillary: 134 mg/dL — ABNORMAL HIGH (ref 70–99)
Glucose-Capillary: 143 mg/dL — ABNORMAL HIGH (ref 70–99)
Glucose-Capillary: 147 mg/dL — ABNORMAL HIGH (ref 70–99)
Glucose-Capillary: 154 mg/dL — ABNORMAL HIGH (ref 70–99)
Glucose-Capillary: 155 mg/dL — ABNORMAL HIGH (ref 70–99)
Glucose-Capillary: 168 mg/dL — ABNORMAL HIGH (ref 70–99)

## 2013-08-06 MED ORDER — FREE WATER
200.0000 mL | Freq: Three times a day (TID) | Status: DC
  Administered 2013-08-06 – 2013-08-18 (×32): 200 mL

## 2013-08-06 NOTE — Evaluation (Signed)
Physical Therapy Assessment and Plan  Patient Details  Name: Keith [name] MRN: [id number] Date of Birth: 01/10/1946  PT Diagnosis: Abnormal posture, Abnormality of gait, Ataxia, Ataxic gait, Cognitive deficits, Coordination disorder, Difficulty walking, Hemiparesis non-dominant and Muscle weakness Rehab Potential: Good ELOS: 14-16 days   Today's Date: 08/06/2013 Time: 1100-1202 Time Calculation (min): 62 min  Problem List:  Patient Active Problem List   Diagnosis Date Noted  . Dysarthria due to cerebrovascular accident 08/05/2013  . Weakness 08/05/2013  . Type II or unspecified type diabetes mellitus without mention of complication, not stated as uncontrolled 08/05/2013  . Essential hypertension, benign 08/05/2013  . Depression with anxiety 08/05/2013  . Alcohol abuse 08/05/2013  . Facial droop 07/31/2013  . CVA (cerebral infarction) 07/31/2013    Past Medical History:  Past Medical History  Diagnosis Date  . Diabetes mellitus without complication   . Hypertension    Past Surgical History:  Past Surgical History  Procedure Laterality Date  . Prostate surgery    . Knee surgery      Assessment & Plan Clinical Impression: Patient is a 67 y.o. year old male with history of DM, HTN, who was admitted on 07/31/13 with left facial droop with dysarthria and one week history of imbalance as diplopia X 2 days. MRI/MRA brain done revealing two small acute brainstem infarcts in-right paracentral pons, and central to left paracentral medulla possibly involving left pyramid, no stenosis. 2D echo with EF 55-60% and grade 1 diastolic dysfunction. Carotid dopplers with 1-39% ICA stenosis. Neurology services consulted maintained on aspirin and Plavix therapy for CVA prophylaxis. Subcutaneous heparin added for DVT prophylaxis.Marland Kitchen MBS done 08/01/13 alternative nutritional means and NPO recommended. Patient transferred to CIR on 08/05/2013 .   Patient currently requires mod with mobility secondary to  muscle weakness, decreased cardiorespiratoy endurance, impaired timing and sequencing, ataxia, decreased coordination and decreased motor planning and decreased attention, decreased awareness and delayed processing.  Prior to hospitalization, patient was independent  with mobility and lived with Family in a House home.  Home access is 4Stairs to enter.  Patient will benefit from skilled PT intervention to maximize safe functional mobility, minimize fall risk and decrease caregiver burden for planned discharge home with 24 hour supervision.  Anticipate patient will benefit from follow up OP at discharge.  PT - End of Session Activity Tolerance: Tolerates 30+ min activity with multiple rests Endurance Deficit: Yes Endurance Deficit Description: Requires several seated rest breaks due to noted inreased WOB during activity.  PT Assessment Rehab Potential: Good Barriers to Discharge: Decreased caregiver support PT Patient demonstrates impairments in the following area(s): Balance;Endurance;Motor;Safety PT Transfers Functional Problem(s): Bed Mobility;Bed to Chair;Car PT Locomotion Functional Problem(s): Ambulation;Wheelchair Mobility;Stairs PT Plan PT Intensity: Minimum of 1-2 x/day ,45 to 90 minutes PT Frequency: 5 out of 7 days PT Duration Estimated Length of Stay: 14-16 days PT Treatment/Interventions: Ambulation/gait training;Balance/vestibular training;Discharge planning;DME/adaptive equipment instruction;Functional mobility training;Neuromuscular re-education;Pain management;Patient/family education;Stair training;Therapeutic Activities;Therapeutic Exercise;UE/LE Strength taining/ROM;UE/LE Coordination activities;Wheelchair propulsion/positioning PT Transfers Anticipated Outcome(s): supervision  PT Locomotion Anticipated Outcome(s): supervision PT Recommendation Recommendations for Other Services: Neuropsych consult (may benefit due to stated comments about deficits) Follow Up  Recommendations: Outpatient PT;24 hour supervision/assistance Patient destination: Home Equipment Recommended: Rolling walker with 5" wheels;Wheelchair (measurements);Wheelchair cushion (measurements)  Skilled Therapeutic Intervention Performed skilled assessment of strength, sensation, coordination, proprioception and mobility during evaluation portion of session.  See details below.  Initiated gait training with use of RW for increased support and balance.  Note improvement in balance during  gait, however requires mod assist at times due to ataxic and uncoordinated movements.  Provided verbal cues for upright posture, increased step length bilaterally, increased step width, and not stepping too far inside of RW.  Provided mod assist to prevent LOB posteriorly and R/L at times.  Note decreased righting reactions with LOB and required assist to correct and prevent fall.  Educated pt on expected outcomes, estimated LOS, rehab scheduling, and safety while at CIR.    PT Evaluation Precautions/Restrictions Precautions Precautions: Fall Restrictions Weight Bearing Restrictions: No General   Vital Signs  Pain Pain Assessment Pain Assessment: No/denies pain Pain Score: 0-No pain Home Living/Prior Functioning Home Living Available Help at Discharge: Family (son) Type of Home: House Home Access: Stairs to enter [employer] of Steps: 4 Entrance Stairs-Rails: Can reach both Home Layout: Multi-level;Able to live on main level with bedroom/bathroom  Lives With: Family Prior Function Level of Independence: Independent with basic ADLs;Independent with homemaking with ambulation;Independent with gait;Independent with transfers  Able to Take Stairs?: Yes Driving: Yes Vocation: Full time employment Leisure: Hobbies-yes (Comment) Comments: likes to hunt and fish Vision/Perception (per OT report) Vision - History Baseline Vision: Wears glasses only for reading Visual History:  Cataracts Patient Visual Report: Other (comment) (Pt reports visual acuity change since the stroke) Vision - Assessment Eye Alignment: Impaired (comment) (left eye sits superior compared to the right eye) Vision Assessment: Vision tested Ocular Range of Motion: Within Functional Limits Tracking/Visual Pursuits: Decreased smoothness of horizontal tracking Visual Fields: No apparent deficits Perception Perception: Within Functional Limits Praxis Praxis: Intact  Cognition Overall Cognitive Status: Impaired/Different from baseline Arousal/Alertness: Awake/alert Orientation Level: Oriented X4 Attention: Sustained Sustained Attention: Impaired Sustained Attention Impairment: Functional complex Memory: Appears intact (During selfcare tasks.) Awareness: Appears intact (see comments below) Problem Solving: Appears intact Safety/Judgment: Impaired Comments: When asked about getting out of chair if he needed something, states how to perform task rather than calling and asking for assistance.  Also noted that pt states increased difficulty using R hand during w/c mobility, however actually has difficulty with LUE during task.  Sensation Sensation Light Touch: Appears Intact Stereognosis: Not tested Hot/Cold: Not tested Proprioception: Appears Intact Coordination Gross Motor Movements are Fluid and Coordinated: No Fine Motor Movements are Fluid and Coordinated: No Coordination and Movement Description: Note pt to have ataxic movements during functional transfers and also with gait.  Noted improvement with cues for slower gait pattern.  Finger Nose Finger Test: Dysmetria noted in the LUE with finger to nose testing.   Heel Shin Test: Dysmetria noted in LLE with heel shin test.  Motor  Motor Motor: Ataxia;Abnormal postural alignment and control;Hemiplegia Motor - Skilled Clinical Observations: Pt demonstrates ataxia in his trunk, hips. LLE and LUE with functional use.    Mobility Bed  Mobility Bed Mobility: Supine to Sit;Sit to Supine Supine to Sit: 3: Mod assist;HOB flat Supine to Sit Details: Verbal cues for sequencing;Verbal cues for technique;Verbal cues for precautions/safety;Manual facilitation for weight shifting Sitting - Scoot to Edge of Bed: 4: Min assist Sitting - Scoot to [place] of Bed Details: Verbal cues for sequencing Sit to Supine: 4: Min assist;HOB flat Sit to Supine - Details: Verbal cues for sequencing;Verbal cues for technique;Verbal cues for precautions/safety Sit to Supine - Details (indicate cue type and reason): Some assist for safe descent of trunk into bed.  Transfers Transfers: Yes Sit to Stand: 3: Mod assist;With upper extremity assist;From chair/3-in-1 Sit to Stand Details: Manual facilitation for weight bearing;Verbal cues for technique;Verbal cues  for sequencing;Manual facilitation for weight shifting Stand to Sit: 3: Mod assist;With upper extremity assist;To chair/3-in-1 Stand to Sit Details (indicate cue type and reason): Verbal cues for safe use of DME/AE;Manual facilitation for weight bearing;Verbal cues for technique;Manual facilitation for weight shifting;Verbal cues for precautions/safety;Verbal cues for sequencing Stand to Sit Details: Performed sit <> stand and stand pivot transfers at mod assist.  Note increased posterior LOB once in standing.  Provided assist for forward weight shift to stand and also assist with maintaing forward weight shift during transfers.  Locomotion  Ambulation Ambulation: Yes Ambulation/Gait Assistance: 3: Mod assist Ambulation Distance (Feet): 80 Feet (and another 77' x 1 and 15' x 1) Assistive device: Rolling walker (HHA) Ambulation/Gait Assistance Details: Verbal cues for sequencing;Verbal cues for technique;Verbal cues for precautions/safety;Verbal cues for gait pattern;Verbal cues for safe use of DME/AE;Manual facilitation for weight shifting Ambulation/Gait Assistance Details: Initially assessed gait  without AD and with HHA at mod assist with assist for increased forward weight shift and for proper weight shift R and L during gait.  Also provided cues for increased step length B and also for slower controlled steps due to ataxic movement.  Then initiated gait training with RW with noted improved balance, however requires mod assist at times to prevent LOB posteriorly and also to the L.  Also continues to provide cues for increased step length and width and slower controlled steps.   Gait Gait: Yes Gait Pattern: Impaired Gait Pattern: Step-through pattern;Trunk flexed;Narrow base of support;Ataxic;Decreased stride length Stairs / Additional Locomotion Stairs: Yes Stairs Assistance: 3: Mod assist Stairs Assistance Details: Verbal cues for sequencing;Verbal cues for technique;Verbal cues for precautions/safety;Manual facilitation for weight shifting;Manual facilitation for weight bearing;Verbal cues for gait pattern Stair Management Technique: Two rails;Step to pattern;Forwards Number of Stairs: 5 Height of Stairs: 4 (and 6) Wheelchair Mobility Wheelchair Mobility: Yes Wheelchair Assistance: 4: Min assist;3: Armed forces technical officer Details: Verbal cues for technique;Verbal cues for precautions/safety;Tactile cues for placement;Tactile cues for sequencing Wheelchair Propulsion: Both upper extremities Wheelchair Parts Management: Needs assistance Distance: 100  Trunk/Postural Assessment  Cervical Assessment Cervical Assessment: Exceptions to [facility] Cervical Strength Overall Cervical Strength Comments: Pt demonstrates increased head tilt to the right. Thoracic Assessment Thoracic Assessment: Exceptions to [facility] Thoracic Strength Overall Thoracic Strength Comments: slight thoracic kyphosis noted Postural Control Postural Control: Deficits on evaluation Postural Limitations: pt with decreased postural control in standing with LOB posteriorly.  Balance Balance Balance Assessed:  Yes Static Sitting Balance Static Sitting - Balance Support: Right upper extremity supported;Left upper extremity supported Static Sitting - Level of Assistance: 5: Stand by assistance Dynamic Sitting Balance Dynamic Sitting - Balance Support: No upper extremity supported Dynamic Sitting - Level of Assistance: 4: Min assist Static Standing Balance Static Standing - Balance Support: Bilateral upper extremity supported Static Standing - Level of Assistance: 3: Mod assist Static Standing - Comment/# of Minutes: Pt able to stand for 2 mins at the sink during bathing task with mod assist.  Noted increased ataxia when standing. Dynamic Standing Balance Dynamic Standing - Balance Support: Left upper extremity supported Dynamic Standing - Level of Assistance:  (during functional transfers and short distance mobiiity without assistive device.) Extremity Assessment  RUE Assessment RUE Assessment: Within Functional Limits LUE Assessment LUE Assessment: Exceptions to [facility] LUE Strength LUE Overall Strength Comments: Pt is currenlty stage V [place] level in his left arm and hand.  He is able to integrate the LUE as an active assist for bathing and dressing but needs occasional min  assist level.  AROM shoulder flexion 0-70 degrees . Full AROM grasp and release, decreased ability to oppose thumb to digits 4 and 5. RLE Assessment RLE Assessment: Within Functional Limits LLE Assessment LLE Assessment: Exceptions to [facility] LLE Strength LLE Overall Strength: Deficits LLE Overall Strength Comments: Pt with 3/5 hip flex, 3+/5 knee flex, 4/5 knee ext, 4/5 ankle DF/PF  FIM:  FIM - Bed/Chair Transfer Bed/Chair Transfer Assistive Devices: Arm rests Bed/Chair Transfer: 3: Supine > Sit: Mod A (lifting assist/Pt. 50-74%/lift 2 legs;4: Sit > Supine: Min A (steadying pt. > 75%/lift 1 leg);3: Bed > Chair or W/C: Mod A (lift or lower assist);3: Chair or W/C > Bed: Mod A (lift or lower assist) FIM - Locomotion:  Wheelchair Distance: 100 Locomotion: Wheelchair: 1: Travels less than 50 ft with moderate assistance (Pt: 50 - 74%) FIM - Locomotion: Ambulation Locomotion: Ambulation Assistive Devices: Walker - Rolling (without AD and with RW) Ambulation/Gait Assistance: 3: Mod assist Locomotion: Ambulation: 2: Travels 50 - 149 ft with moderate assistance (Pt: 50 - 74%) FIM - Locomotion: Stairs Locomotion: Building control surveyor: Hand rail - 2 Locomotion: Stairs: 2: Up and Down 4 - 11 stairs with moderate assistance (Pt: 50 - 74%)   Refer to Care Plan for Long Term Goals  Recommendations for other services: Neuropsych  Discharge Criteria: Patient will be discharged from PT if patient refuses treatment 3 consecutive times without medical reason, if treatment goals not met, if there is a change in medical status, if patient makes no progress towards goals or if patient is discharged from hospital.  The above assessment, treatment plan, treatment alternatives and goals were discussed and mutually agreed upon: by patient  Vista Deck 08/06/2013, 1:13 PM

## 2013-08-06 NOTE — Progress Notes (Signed)
Occupational Therapy Session Note  Patient Details  Name: Keith [name] MRN: [id number] Date of Birth: Jul 05, 1946  Today's Date: 08/06/2013 Time: 9562-1308 Time Calculation (min): 30 min  Short Term Goals: Week 1:  OT Short Term Goal 1 (Week 1): Pt will perform bathing sit to stand with close supervison at the sink. OT Short Term Goal 2 (Week 1): Pt will perform toilet transfer with min assist to 3:1. OT Short Term Goal 3 (Week 1): Pt will perform UB and LB dressing with supervision. OT Short Term Goal 4 (Week 1): Pt will use the LUE at a diminished level for selfcare tasks with supervision OT Short Term Goal 5 (Week 1): Pt will peform gross motor and FM coordination exercises with the LUE with supervison.  Skilled Therapeutic Interventions/Progress Updates:    Pt was taken down to the OT gym.  Transferred to the UE ergonometer with mod assist.  Had pt perform 2 intervals of 4 mins each to increase strength in the LUE.  First interval pt performed using BUEs with resistance set a level 8 and RPMs maintained between 14 and 20.  After brief rest pt performed second interval using just the LUE for 4 mins.  Multiple times pt had to stop and re-grip secondary to his decreased hand strength.  Finished session by having pt work on pushing his wheelchair back toward his room with min assist.  Therapist wrapped the left wheel rim with theraband to help with pts grip.    Therapy Documentation Precautions:  Precautions Precautions: Fall Precaution Comments: right side head rotation and tilt, dysarthria, left hemiparesis Restrictions Weight Bearing Restrictions: No  Pain: Pain Assessment Pain Assessment: No/denies pain  See FIM for current functional status  Therapy/Group: Individual Therapy  Kirrah Mustin OTR/L 08/06/2013, 4:14 PM

## 2013-08-06 NOTE — Progress Notes (Signed)
[facility]patient [facility] Individual Statement of Services  Patient Name:  Keith [name]  Date:  08/06/2013  Welcome to the Inpatient Rehabilitation Center.  Our goal is to provide you with an individualized program based on your diagnosis and situation, designed to meet your specific needs.  With this comprehensive rehabilitation program, you will be expected to participate in at least 3 hours of rehabilitation therapies Monday-Friday, with modified therapy programming on the weekends.  Your rehabilitation program will include the following services:  Physical Therapy (PT), Occupational Therapy (OT), Speech Therapy (ST), 24 hour per day rehabilitation nursing, Therapeutic Recreation (TR), Neuropsychology, Case Management (Social Worker), Rehabilitation Medicine, Nutrition Services and Pharmacy Services  Weekly team conferences will be held on Wednesdays to discuss your progress.  Your Social Worker will talk with you frequently to get your input and to update you on team discussions.  Team conferences with you and your family in attendance may also be held.  Expected length of stay:  12 -16 days  Overall anticipated outcome:  Supervision  Depending on your progress and recovery, your program may change. Your Social Worker will coordinate services and will keep you informed of any changes. Your Social Worker's name and contact numbers are listed  below.  The following services may also be recommended but are not provided by the Inpatient Rehabilitation Center:   Driving Evaluations  Home Health Rehabiltiation Services  Outpatient Rehabilitation Services   Arrangements will be made to provide these services after discharge if needed.  Arrangements include referral to agencies that provide these services.  Your insurance has been verified to be:  Medicare and [employer] Your primary doctor is:  Dr. Joette Catching  Pertinent information will be shared with your doctor and your  insurance company.  Social Worker:  Staci Acosta, LCSW  [phone] or ([phone]  Information discussed with and copy given to patient by: Elvera Lennox, 08/06/2013, 1:50 PM

## 2013-08-06 NOTE — Progress Notes (Signed)
Patient information reviewed and entered into eRehab system by Muhannad Bignell, RN, CRRN, PPS Coordinator.  Information including medical coding and functional independence measure will be reviewed and updated through discharge.     Per nursing patient was given "Data Collection Information Summary for Patients in Inpatient Rehabilitation Facilities with attached "Privacy Act Statement-Health Care Records" upon admission.  

## 2013-08-06 NOTE — Discharge Summary (Signed)
Family Medicine Teaching Service  Discharge Note : Attending Jeff Wendy Hoback MD Pager [phone] Inpatient Team Pager:  [phone]  I have reviewed this patient and the patient's chart and have discussed discharge planning with the resident at the time of discharge. I agree with the discharge plan as above.    

## 2013-08-06 NOTE — Progress Notes (Signed)
67 y.o. male with history of DM, HTN, who was admitted on 07/31/13 with left facial droop with dysarthria and one week history of imbalance as diplopia X 2 days. MRI/MRA brain done revealing two small acute brainstem infarcts in-right paracentral pons, and central to left paracentral medulla possibly involving left pyramid, no stenosis. 2D echo with EF 55-60% and grade 1 diastolic dysfunction. Carotid dopplers with 1-39% ICA stenosis. Neurology services consulted maintained on aspirin and Plavix therapy for CVA prophylaxis. Subcutaneous heparin added for DVT prophylaxis.Marland Kitchen MBS done 08/01/13 alternative nutritional means and NPO recommended    Subjective/Complaints:  Slept okay last night. No bowel or bladder complaints. Still n.p.o. Some congestion. Coughing.  Review of Systems - Negative except Left-sided weakness, coughing Objective: Vital Signs: Blood pressure 162/81, pulse 64, temperature 97.9 F (36.6 C), temperature source Oral, resp. rate 18, weight 78.019 kg (172 lb), SpO2 96.00%. No results found. Results for orders placed during the hospital encounter of 08/05/13 (from the past 72 hour(s))  GLUCOSE, CAPILLARY     Status: Abnormal   Collection Time    08/05/13  5:20 PM      Result Value Range   Glucose-Capillary 154 (*) 70 - 99 mg/dL   Comment 1 Notify RN    CBC     Status: Abnormal   Collection Time    08/05/13  6:35 PM      Result Value Range   WBC 6.7  4.0 - 10.5 K/uL   RBC 3.89 (*) 4.22 - 5.81 MIL/uL   Hemoglobin 13.4  13.0 - 17.0 g/dL   HCT 04.5 (*) 40.9 - 81.1 %   MCV 94.6  78.0 - 100.0 fL   MCH 34.4 (*) 26.0 - 34.0 pg   MCHC 36.4 (*) 30.0 - 36.0 g/dL   RDW 91.4  78.2 - 95.6 %   Platelets 193  150 - 400 K/uL  CREATININE, SERUM     Status: None   Collection Time    08/05/13  6:35 PM      Result Value Range   Creatinine, Ser 0.56  0.50 - 1.35 mg/dL   GFR calc non Af Amer >90  >90 mL/min   GFR calc Af Amer >90  >90 mL/min   Comment: (NOTE)     The eGFR has been  calculated using the CKD EPI equation.     This calculation has not been validated in all clinical situations.     eGFR's persistently <90 mL/min signify possible Chronic Kidney     Disease.  GLUCOSE, CAPILLARY     Status: Abnormal   Collection Time    08/05/13  8:26 PM      Result Value Range   Glucose-Capillary 148 (*) 70 - 99 mg/dL  GLUCOSE, CAPILLARY     Status: Abnormal   Collection Time    08/05/13 11:55 PM      Result Value Range   Glucose-Capillary 163 (*) 70 - 99 mg/dL   Comment 1 Notify RN    GLUCOSE, CAPILLARY     Status: Abnormal   Collection Time    08/06/13  3:53 AM      Result Value Range   Glucose-Capillary 147 (*) 70 - 99 mg/dL   Comment 1 Notify RN    CBC WITH DIFFERENTIAL     Status: Abnormal   Collection Time    08/06/13  5:45 AM      Result Value Range   WBC 6.9  4.0 - 10.5 K/uL   RBC 3.75 (*)  4.22 - 5.81 MIL/uL   Hemoglobin 12.7 (*) 13.0 - 17.0 g/dL   HCT 16.1 (*) 09.6 - 04.5 %   MCV 94.7  78.0 - 100.0 fL   MCH 33.9  26.0 - 34.0 pg   MCHC 35.8  30.0 - 36.0 g/dL   RDW 40.9  81.1 - 91.4 %   Platelets 184  150 - 400 K/uL   Neutrophils Relative % 61  43 - 77 %   Neutro Abs 4.2  1.7 - 7.7 K/uL   Lymphocytes Relative 23  12 - 46 %   Lymphs Abs 1.6  0.7 - 4.0 K/uL   Monocytes Relative 14 (*) 3 - 12 %   Monocytes Absolute 0.9  0.1 - 1.0 K/uL   Eosinophils Relative 2  0 - 5 %   Eosinophils Absolute 0.2  0.0 - 0.7 K/uL   Basophils Relative 0  0 - 1 %   Basophils Absolute 0.0  0.0 - 0.1 K/uL  COMPREHENSIVE METABOLIC PANEL     Status: Abnormal   Collection Time    08/06/13  5:45 AM      Result Value Range   Sodium 137  135 - 145 mEq/L   Potassium 3.6  3.5 - 5.1 mEq/L   Chloride 102  96 - 112 mEq/L   CO2 27  19 - 32 mEq/L   Glucose, Bld 168 (*) 70 - 99 mg/dL   BUN 9  6 - 23 mg/dL   Creatinine, Ser 7.82  0.50 - 1.35 mg/dL   Calcium 8.5  8.4 - 95.6 mg/dL   Total Protein 6.1  6.0 - 8.3 g/dL   Albumin 3.1 (*) 3.5 - 5.2 g/dL   AST 18  0 - 37 U/L   ALT 36   0 - 53 U/L   Alkaline Phosphatase 51  39 - 117 U/L   Total Bilirubin 0.4  0.3 - 1.2 mg/dL   GFR calc non Af Amer >90  >90 mL/min   GFR calc Af Amer >90  >90 mL/min   Comment: (NOTE)     The eGFR has been calculated using the CKD EPI equation.     This calculation has not been validated in all clinical situations.     eGFR's persistently <90 mL/min signify possible Chronic Kidney     Disease.  GLUCOSE, CAPILLARY     Status: Abnormal   Collection Time    08/06/13  7:40 AM      Result Value Range   Glucose-Capillary 155 (*) 70 - 99 mg/dL     HEENT: normal Cardio: RRR and no murmur Resp: CTA B/L and unlabored GI: BS positive and non distended Extremity:  Pulses positive and No Edema Skin:   Intact Neuro: Alert/Oriented, Cranial Nerve Abnormalities Left central 7, Abnormal Motor 2-/5 LUE, 3-/5 LLE, 5/5 on  Right side, Abnormal FMC Ataxic/ dec FMC and Dysarthric Musc/Skel:  Normal GEN: NAD   Assessment/Plan: 1. Functional deficits secondary to Right pontine and Left medullary infarcts which require 3+ hours per day of interdisciplinary therapy in a comprehensive inpatient rehab setting. Physiatrist is providing close team supervision and 24 hour management of active medical problems listed below. Physiatrist and rehab team continue to assess barriers to discharge/monitor patient progress toward functional and medical goals. FIM:                   Comprehension Comprehension Mode: Auditory Comprehension: 6-Follows complex conversation/direction: With extra time/assistive device  Expression Expression Mode: Verbal Expression: 6-Expresses  complex ideas: With extra time/assistive device  Social Interaction Social Interaction: 5-Interacts appropriately 90% of the time - Needs monitoring or encouragement for participation or interaction.  Problem Solving Problem Solving: 5-Solves complex 90% of the time/cues < 10% of the time  Memory Memory: 5-Recognizes or recalls  90% of the time/requires cueing < 10% of the time  Medical Problem List and Plan:  1. Thrombotic left medullary, right paracentral pontine infarct  2. DVT Prophylaxis/Anticoagulation: Subcutaneous heparin. Monitor platelet counts and any signs of bleeding  3. Pain Management: Tylenol as needed  4. Mood: Zoloft 50 mg daily, Ativan 0.5 mg every 4 as needed anxiety  5. Neuropsych: This patient is capable of making decisions on his own behalf.  6. Dysphagia. Patient currently n.p.o. with nasogastric tube feeds. Followup speech therapy , may D/C IVF 7. Hypertension. Norvasc 5 mg daily. Monitor with increased mobility  8. Hyperlipidemia. Lipitor  9. Diabetes mellitus with peripheral neuropathy. controlledHemoglobin A1c 7.4. Continue sliding scale insulin. Patient on Glucophage 500 mg twice a day prior to admission. Will resume as tolerated     LOS (Days) 1 A FACE TO FACE EVALUATION WAS PERFORMED  Keith [name] 08/06/2013, 8:39 AM

## 2013-08-06 NOTE — Evaluation (Signed)
Occupational Therapy Assessment and Plan  Patient Details  Name: Keith [name] MRN: [id number] Date of Birth: [birth year]  OT Diagnosis: abnormal posture, hemiplegia affecting non-dominant side and muscle weakness (generalized) Rehab Potential: Rehab Potential: Good ELOS: 14-16 days   Today's Date: 08/06/2013 Time: 1001-1103 Time Calculation (min): 62 min  Problem List:  Patient Active Problem List   Diagnosis Date Noted  . Dysarthria due to cerebrovascular accident 08/05/2013  . Weakness 08/05/2013  . Type II or unspecified type diabetes mellitus without mention of complication, not stated as uncontrolled 08/05/2013  . Essential hypertension, benign 08/05/2013  . Depression with anxiety 08/05/2013  . Alcohol abuse 08/05/2013  . Facial droop 07/31/2013  . CVA (cerebral infarction) 07/31/2013    Past Medical History:  Past Medical History  Diagnosis Date  . Diabetes mellitus without complication   . Hypertension    Past Surgical History:  Past Surgical History  Procedure Laterality Date  . Prostate surgery    . Knee surgery      Assessment & Plan Clinical Impression: Patient is a 67 y.o. year old male with recent admission to the hospital on 07/31/13 with left facial droop with dysarthria and one week history of imbalance as diplopia X 2 days. MRI/MRA brain done revealing two small acute brainstem infarcts in-right paracentral pons, and central to left paracentral medulla possibly involving left pyramid.  Patient transferred to CIR on 08/05/2013 .    Patient currently requires mod with basic self-care skills secondary to muscle weakness and impaired timing and sequencing, unbalanced muscle activation, ataxia and decreased coordination.  Prior to hospitalization, patient could complete ADLs with independent .  Patient will benefit from skilled intervention to decrease level of assist with basic self-care skills and increase independence with basic self-care skills prior to  discharge home with family.  Anticipate patient will require 24 hour supervision and follow up home health.  OT - End of Session Activity Tolerance: Tolerates 30+ min activity with multiple rests Endurance Deficit: Yes OT Assessment Rehab Potential: Good Barriers to Discharge: Decreased caregiver support Barriers to Discharge Comments: Pt lives alone will likely need 24 hour initial supervision at discharge. OT Patient demonstrates impairments in the following area(s): Balance;Motor;Endurance OT Basic ADL's Functional Problem(s): Eating;Grooming;Bathing;Dressing;Toileting OT Transfers Functional Problem(s): Toilet OT Additional Impairment(s): Fuctional Use of Upper Extremity OT Plan OT Intensity: Minimum of 1-2 x/day, 45 to 90 minutes OT Frequency: 5 out of 7 days OT Duration/Estimated Length of Stay: 14-16 days OT Treatment/Interventions: Balance/vestibular training;DME/adaptive equipment instruction;Psychosocial support;Patient/family education;Self Care/advanced ADL retraining;UE/LE Coordination activities;UE/LE Strength taining/ROM;Therapeutic Activities OT Self Feeding Anticipated Outcome(s): independent OT Basic Self-Care Anticipated Outcome(s): supervison OT Toileting Anticipated Outcome(s): supervision OT Bathroom Transfers Anticipated Outcome(s): supervision OT Recommendation Patient destination: Home Follow Up Recommendations: 24 hour supervision/assistance Equipment Recommended: 3 in 1 bedside comode;Tub/shower bench   Skilled Therapeutic Intervention   OT Evaluation Precautions/Restrictions  Precautions Precautions: Fall Restrictions Weight Bearing Restrictions: No  Pain Pain Assessment Pain Assessment: No/denies pain Pain Score: 0-No pain Home Living/Prior Functioning Home Living Available Help at Discharge: Family (son) Type of Home: House Home Access: Stairs to enter [employer] of Steps: 4 Entrance Stairs-Rails: Can reach both Home Layout:  Multi-level;Able to live on main level with bedroom/bathroom  Lives With: Family IADL History Homemaking Responsibilities: Yes Meal Prep Responsibility: Primary Prior Function Level of Independence: Independent with basic ADLs;Independent with homemaking with ambulation;Independent with gait;Independent with transfers  Able to Take Stairs?: Yes Driving: Yes Vocation: Full time employment Leisure: Hobbies-yes (Comment) Comments: likes to  hunt and fish ADL  See FIM scale  Vision/Perception  Vision - History Baseline Vision: Wears glasses only for reading Visual History: Cataracts Patient Visual Report: Other (comment) (Pt reports visual acuity change since the stroke) Vision - Assessment Eye Alignment: Impaired (comment) (left eye sits superior compared to the right eye) Vision Assessment: Vision tested Ocular Range of Motion: Within Functional Limits Tracking/Visual Pursuits: Decreased smoothness of horizontal tracking Visual Fields: No apparent deficits Perception Perception: Within Functional Limits Praxis Praxis: Intact  Cognition Overall Cognitive Status: Within Functional Limits for tasks assessed Orientation Level: Oriented X4 Attention: Sustained Sustained Attention: Appears intact (during selfcare tasks) Memory: Appears intact (During selfcare tasks.) Awareness: Appears intact Problem Solving: Appears intact Safety/Judgment: Appears intact Comments: Pt able to answer questions appropriately and sequence through bathing, dressing, and grooming tasks without difficulty. Sensation Sensation Light Touch: Appears Intact (In bilateral UES) Stereognosis: Not tested Hot/Cold: Not tested Proprioception: Appears Intact (in bilateral UEs) Coordination Gross Motor Movements are Fluid and Coordinated: No Fine Motor Movements are Fluid and Coordinated: No Coordination and Movement Description: Pt is currenlty Brunnstrum stage V in the right arm and hand.   Moderate ataxia  noted with functional use and he is only able to oppose his thumb to the 1st and 2nd digits.  Unable to fasten the buttons on his button up pajamas. Finger Nose Finger Test: Dysmetria noted in the LUE with finger to nose testing.   Motor  Motor Motor: Ataxia;Abnormal postural alignment and control;Hemiplegia Motor - Skilled Clinical Observations: Pt demonstrates ataxia in his trunk, hips. LLE and LUE with functional use.   Mobility  Bed Mobility Bed Mobility: Supine to Sit Supine to Sit: With rails;4: Min assist;3: Mod assist Supine to Sit Details: Verbal cues for sequencing Sitting - Scoot to Edge of Bed: 4: Min assist Sitting - Scoot to [place] of Bed Details: Verbal cues for sequencing Transfers Transfers: Sit to Stand Sit to Stand: 3: Mod assist;Without upper extremity assist;From bed Sit to Stand Details: Manual facilitation for weight bearing;Verbal cues for technique;Verbal cues for sequencing Stand to Sit: 3: Mod assist;With upper extremity assist;To chair/3-in-1 Stand to Sit Details (indicate cue type and reason): Verbal cues for safe use of DME/AE;Manual facilitation for weight bearing;Verbal cues for technique  Trunk/Postural Assessment  Cervical Assessment Cervical Assessment: Exceptions to [facility] Cervical Strength Overall Cervical Strength Comments: Pt demonstrates increased head tilt to the right. Thoracic Assessment Thoracic Assessment: Exceptions to [facility] Thoracic Strength Overall Thoracic Strength Comments: slight thoracic kyphosis noted  Balance Balance Balance Assessed: Yes Static Sitting Balance Static Sitting - Balance Support: Right upper extremity supported;Left upper extremity supported Static Sitting - Level of Assistance: 5: Stand by assistance Dynamic Sitting Balance Dynamic Sitting - Balance Support: No upper extremity supported Dynamic Sitting - Level of Assistance: 4: Min assist Static Standing Balance Static Standing - Balance Support: Right upper  extremity supported;Left upper extremity supported Static Standing - Level of Assistance: 3: Mod assist Static Standing - Comment/# of Minutes: Pt able to stand for 2 mins at the sink during bathing task with mod assist.  Noted increased ataxia when standing. Dynamic Standing Balance Dynamic Standing - Balance Support: Left upper extremity supported Dynamic Standing - Level of Assistance: 2: Max assist (during functional transfers and short distance mobiiity without assistive device.) Extremity/Trunk Assessment RUE Assessment RUE Assessment: Within Functional Limits LUE Assessment LUE Assessment: Exceptions to [facility] LUE Strength LUE Overall Strength Comments: Pt is currenlty stage V Brunnstum level in his left arm and hand.  He is able to integrate the LUE as an active assist for bathing and dressing but needs occasional min assist level.  AROM shoulder flexion 0-70 degrees . Full AROM grasp and release, decreased ability to oppose thumb to digits 4 and 5.  FIM:  FIM - Grooming Grooming Steps: Wash, rinse, dry face;Wash, rinse, dry hands;Oral care, brush teeth, clean dentures;Brush, comb hair Grooming: 5: Supervision: safety issues or verbal cues FIM - Bathing Bathing Steps Patient Completed: Chest;Right Arm;Left Arm;Abdomen;Front perineal area;Buttocks;Right upper leg;Left upper leg;Right lower leg (including foot) Bathing: 4: Steadying assist FIM - Upper Body Dressing/Undressing Upper body dressing/undressing steps patient completed: Thread/unthread left sleeve of front closure shirt/dress;Pull shirt around back of front closure shirt/dress Upper body dressing/undressing: 3: Mod-Patient completed 50-74% of tasks FIM - Lower Body Dressing/Undressing Lower body dressing/undressing steps patient completed: Thread/unthread right underwear leg;Thread/unthread left underwear leg;Pull underwear up/down;Thread/unthread right pants leg;Don/Doff left sock;Thread/unthread left pants leg;Don/Doff right  sock;Fasten/unfasten pants;Pull pants up/down Lower body dressing/undressing: 4: Min-Patient completed 75 plus % of tasks FIM - Bed/Chair Transfer Bed/Chair Transfer: 4: Supine > Sit: Min A (steadying Pt. > 75%/lift 1 leg);3: Bed > Chair or W/C: Mod A (lift or lower assist)   Refer to Care Plan for Long Term Goals  Recommendations for other services: None  Discharge Criteria: Patient will be discharged from OT if patient refuses treatment 3 consecutive times without medical reason, if treatment goals not met, if there is a change in medical status, if patient makes no progress towards goals or if patient is discharged from hospital.  The above assessment, treatment plan, treatment alternatives and goals were discussed and mutually agreed upon: by patient Began work on selfcare retraining sit to stand at the sink during session.  Emphasis on functional use of the LUE for bathing and dressing, sequencing, sit to stand, and standing balance.    Tynasia Mccaul OTR/L 08/06/2013, 12:12 PM

## 2013-08-06 NOTE — IPOC Note (Addendum)
Overall Plan of Care [facility]) Patient Details Name: Keith [name] MRN: [id number] DOB: November 21, 1945  Admitting Diagnosis: B CVA  Hospital Problems: Active Problems:   CVA (cerebral infarction)     Functional Problem List: Nursing Endurance;Medication Management;Motor;Nutrition;Pain;Perception;Safety;Sensory;Skin Integrity  PT Balance;Endurance;Motor;Safety  OT Balance;Motor;Endurance  SLP    TR         Basic ADL's: OT Eating;Grooming;Bathing;Dressing;Toileting     Advanced  ADL's: OT       Transfers: PT Bed Mobility;Bed to Chair;Car  OT Toilet     Locomotion: PT Ambulation;Wheelchair Mobility;Stairs     Additional Impairments: OT Fuctional Use of Upper Extremity  SLP Swallowing;Communication expression    TR      Anticipated Outcomes Item Anticipated Outcome  Self Feeding independent  Swallowing  Supervision with least restrictive PO   Basic self-care  supervison  Toileting  supervision   Bathroom Transfers supervision  Bowel/Bladder  remain continent of bowel and bladder  Transfers  supervision   Locomotion  supervision  Communication  Supervision with speech intelligibility strategies  Cognition     Pain  3 or less on scale 0-10  Safety/Judgment  min assist   Therapy Plan: PT Intensity: Minimum of 1-2 x/day ,45 to 90 minutes PT Frequency: 5 out of 7 days PT Duration Estimated Length of Stay: 14-16 days OT Intensity: Minimum of 1-2 x/day, 45 to 90 minutes OT Frequency: 5 out of 7 days OT Duration/Estimated Length of Stay: 14-16 days SLP Intensity: Minumum of 1-2 x/day, 30 to 90 minutes SLP Frequency: 5 out of 7 days SLP Duration/Estimated Length of Stay: 14-16 days       Team Interventions: Nursing Interventions Patient/Family Education;Pain Management;Medication Management;Skin Care/Wound Proofreader  PT interventions Ambulation/gait training;Balance/vestibular training;Discharge planning;DME/adaptive  equipment instruction;Functional mobility training;Neuromuscular re-education;Pain management;Patient/family education;Stair training;Therapeutic Activities;Therapeutic Exercise;UE/LE Strength taining/ROM;UE/LE Coordination activities;Wheelchair propulsion/positioning  OT Interventions Balance/vestibular training;DME/adaptive equipment instruction;Psychosocial support;Patient/family education;Self Care/advanced ADL retraining;UE/LE Coordination activities;UE/LE Strength taining/ROM;Therapeutic Activities  SLP Interventions Cueing hierarchy;Dysphagia/aspiration precaution training;Oral motor exercises;Patient/family education;Speech/Language facilitation;Therapeutic Exercise;Therapeutic Activities  TR Interventions    SW/CM Interventions Discharge Planning;Psychosocial Support;Patient/Family Education    Team Discharge Planning: Destination: PT-Home ,OT- Home , SLP-Home Projected Follow-up: PT-Outpatient PT;24 hour supervision/assistance, OT-  24 hour supervision/assistance, SLP-Home Health SLP Projected Equipment Needs: PT-Rolling walker with 5" wheels;Wheelchair (measurements);Wheelchair cushion (measurements), OT- 3 in 1 bedside comode;Tub/shower bench, SLP-None recommended by SLP Equipment Details: PT- , OT-  Patient/family involved in discharge planning: PT- Patient,  OT-Patient, SLP-Patient  MD ELOS: 14-16 days Medical Rehab Prognosis:  Excellent Assessment: The patient has been admitted for CIR therapies. The team will be addressing, functional mobility, strength, stamina, balance, safety, adaptive techniques/equipment, self-care, bowel and bladder mgt, patient and caregiver education, NMR, cognition, communication, cognitive perceptual awareness. Goals have been set at supervision to min assist for mobility, ADL's, and cognition/communication.    Ranelle Oyster, MD, FAAPMR      See Team Conference Notes for weekly updates to the plan of care

## 2013-08-06 NOTE — Evaluation (Signed)
Speech Language Pathology Assessment and Plan  Patient Details  Name: Keith [name] MRN: [id number] Date of Birth: [birth year]  SLP Diagnosis: Dysarthria;Dysphagia  Rehab Potential: Good ELOS: 14-16 days   Today's Date: 08/06/2013 Time: 1330-1430 Time Calculation (min): 60 min  Problem List:  Patient Active Problem List   Diagnosis Date Noted  . Dysarthria due to cerebrovascular accident 08/05/2013  . Weakness 08/05/2013  . Type II or unspecified type diabetes mellitus without mention of complication, not stated as uncontrolled 08/05/2013  . Essential hypertension, benign 08/05/2013  . Depression with anxiety 08/05/2013  . Alcohol abuse 08/05/2013  . Facial droop 07/31/2013  . CVA (cerebral infarction) 07/31/2013   Past Medical History:  Past Medical History  Diagnosis Date  . Diabetes mellitus without complication   . Hypertension    Past Surgical History:  Past Surgical History  Procedure Laterality Date  . Prostate surgery    . Knee surgery      Assessment / Plan / Recommendation Clinical Impression  Pt is a 67 y.o. male with history of DM, HTN, who was admitted on 07/31/13 with left facial droop with dysarthria and one week history of imbalance as diplopia X 2 days. MRI/MRA brain done revealing two small acute brainstem infarcts in-right paracentral pons, and central to left paracentral medulla possibly involving left pyramid, no stenosis. 2D echo with EF 55-60% and grade 1 diastolic dysfunction. Carotid dopplers with 1-39% ICA stenosis. Neurology services consulted maintained on aspirin and Plavix therapy for CVA prophylaxis. Subcutaneous heparin added for DVT prophylaxis.Marland Kitchen MBS done 08/01/13 alternative nutritional means and NPO recommended with nasogastric tube feeds for nutritional support. He continues with balance deficits, visual deficits, with severe dysphagia and delayed processing. MD, PT, OT, ST recommending CIR. patient was felt to be a good candidate for inpatient  rehabilitation services was admitted for comprehensive rehabilitation program 12/4; SLP bedside swallow and cognitive-linguistic evaluations completed 12/5. Given severity of oropharyngeal dysphagia on MBS 11/30, PO trials were limited to <2mL water per bolus from moistened toothette after thorough oral care. Pt was able to initiate a reflexive swallow and up to two subsequent volitional swallows, however was unable to elicit consistently. Recommend to remain NPO with dysphagia treatment for strengthening exercises with repeat MBS prior to initiation of PO diet. Pt also presents with a moderate dysarthria that is characterized by imprecise articulation due to labial/lingual weakness, mildly low vocal intensity, and hyponasality, which pt attributes to his sinuses. Pt reports a mild amount of hyponasality at baseline due to a h/o multiple nasal fractures. Although he appears to present with mildly prolonged processing time, pt appeared within gross functional limits with cognitive and linguistic skills. Pt will benefit from SLP services to maximize safe swallowing and functional communication prior to discharge.    SLP Assessment  Patient will need skilled Speech Lanaguage Pathology Services during CIR admission    Recommendations  Diet Recommendations: NPO;Alternative means - temporary Medication Administration: Via alternative means Oral Care Recommendations: Oral care Q4 per protocol Patient destination: Home Follow up Recommendations: Home Health SLP Equipment Recommended: None recommended by SLP    SLP Frequency 5 out of 7 days   SLP Treatment/Interventions Cueing hierarchy;Dysphagia/aspiration precaution training;Oral motor exercises;Patient/family education;Speech/Language facilitation;Therapeutic Exercise;Therapeutic Activities    Pain Pain Assessment Pain Assessment: No/denies pain Pain Score: 0-No pain Prior Functioning Cognitive/Linguistic Baseline: Information not available Type of  Home: House  Lives With: Alone Available Help at Discharge: Family (son) Vocation: Part time employment  Short Term Goals: Week 1: SLP Short  Term Goal 1 (Week 1): Pt will perform pharyngeal strengthening exercises with Mod cues SLP Short Term Goal 2 (Week 1): Pt will initiate a volitional pharyngeal swallow x10 with Min cues SLP Short Term Goal 3 (Week 1): Pt will utilize speech intelligibility strategies with Min cues SLP Short Term Goal 4 (Week 1): Pt will perform oral motor exercises with Min cues  See FIM for current functional status Refer to Care Plan for Long Term Goals  Recommendations for other services: None  Discharge Criteria: Patient will be discharged from SLP if patient refuses treatment 3 consecutive times without medical reason, if treatment goals not met, if there is a change in medical status, if patient makes no progress towards goals or if patient is discharged from hospital.  The above assessment, treatment plan, treatment alternatives and goals were discussed and mutually agreed upon: by patient    Maxcine Ham, M.A. CCC-SLP [phone]   Maxcine Ham 08/06/2013, 2:54 PM

## 2013-08-06 NOTE — Progress Notes (Signed)
INITIAL NUTRITION ASSESSMENT  DOCUMENTATION CODES Per approved criteria  -Not Applicable   INTERVENTION: 1.  Enteral nutrition; Continue Glucerna 1.2 @ 65 mL/hr to provide 1872 kcal, 93g protein, 1255 mL free water. Pt needs additional free water.  Recommend 150 mL 4 times daily. 2.  Nutrition-related medication; consider gentle bowel regime if pt continues without BM.  Will also consider transition to fiber-containing formula if no stool. 3.  Modify diet; resume PO diet once medically appropriate per MD/SLP discretion.  NUTRITION DIAGNOSIS: Inadequate oral intake related to inability to eat, dysphagia as evidenced by NPO for safety.   Monitor:  1.  Enteral nutrition; initiation with tolerance.  Pt to meet >/=90% estimated needs with nutrition support.  2.  Wt/wt change; monitor trends 3.  Food/Beverage; diet advancement per MD/SLP discretion with pt meeting >/=90% estimated needs with tolerance.   Reason for Assessment: MST  67 y.o. male  Admitting Dx: CVA  ASSESSMENT: Pt admitted to rehab for deconditioning related to acute brain stem infarcts. Pt was followed by acute speech therapy services who determined pt was not appropriate for POs and recommended alternative means of nutrition.  Pt has been maintained on Glucerna 1.2 via NGT @ 65 mL/hr with tolerance. RD met with pt who reports comfort and tolerance with current tube feeds.  Denies discomfort with tube.  Able to state needs or preferences.   Nutrition Focused Physical Exam:  Subcutaneous Fat:  Orbital Region: WNL Upper Arm Region: WNL Thoracic and Lumbar Region: WNL  Muscle:  Temple Region: WNL Clavicle Bone Region: WNL Clavicle and Acromion Bone Region: WNL Scapular Bone Region: WNL Dorsal Hand: WNL Patellar Region: WNL Anterior Thigh Region: WNL Posterior Calf Region: WNL  Edema: none present  Height: Ht Readings from Last 1 Encounters:  07/31/13 6\' 1"  (1.854 m)    Weight: Wt Readings from Last 1  Encounters:  08/06/13 172 lb (78.019 kg)    Ideal Body Weight: 184 lbs  % Ideal Body Weight: 94%  Wt Readings from Last 10 Encounters:  08/06/13 172 lb (78.019 kg)  08/05/13 179 lb (81.194 kg)    Usual Body Weight: unknown, pt unable to state  BMI:  Body mass index is 22.7 kg/(m^2).  Estimated Nutritional Needs: Kcal: 1900-2100 Protein: 95-105g Fluid: >2.0 L/day  Skin: intact  Diet Order: NPO  EDUCATION NEEDS: -No education needs identified at this time   Intake/Output Summary (Last 24 hours) at 08/06/13 1321 Last data filed at 08/06/13 0309  Gross per 24 hour  Intake      0 ml  Output    550 ml  Net   -550 ml    Last BM: 12/1  Labs:   Recent Labs Lab 07/31/13 1415 08/05/13 1835 08/06/13 0545  NA 134*  --  137  K 5.1  --  3.6  CL 94*  --  102  CO2 29  --  27  BUN 20  --  9  CREATININE 0.93 0.56 0.60  CALCIUM 10.3  --  8.5  GLUCOSE 157*  --  168*    CBG (last 3)   Recent Labs  08/06/13 0353 08/06/13 0740 08/06/13 1202  GLUCAP 147* 155* 143*    Scheduled Meds: . amLODipine  5 mg Per NG tube Daily  . aspirin  300 mg Rectal Daily   Or  . aspirin  325 mg Oral Daily  . atorvastatin  80 mg Per NG tube q1800  . clopidogrel  75 mg Per NG tube Q breakfast  .  fluticasone  2 spray Each Nare BID  . folic acid  1 mg Per Tube Daily  . free water  200 mL Per Tube Q8H  . heparin  5,000 Units Subcutaneous Q8H  . insulin aspart  0-15 Units Subcutaneous Q4H  . multivitamin with minerals  1 tablet Per Tube Daily  . sertraline  50 mg Oral Daily    Continuous Infusions: . feeding supplement (GLUCERNA 1.2 CAL) 1,000 mL (08/06/13 0256)    Past Medical History  Diagnosis Date  . Diabetes mellitus without complication   . Hypertension     Past Surgical History  Procedure Laterality Date  . Prostate surgery    . Knee surgery      Loyce Dys, MS RD LDN Clinical Inpatient Dietitian Pager: [phone] Weekend/After hours pager: [phone]

## 2013-08-06 NOTE — Progress Notes (Signed)
Social Work Assessment and Plan  Patient Details  Name: Keith [name] MRN: [id number] Date of Birth: 1946-05-20  Today's Date: 08/06/2013  Problem List:  Patient Active Problem List   Diagnosis Date Noted  . Dysarthria due to cerebrovascular accident 08/05/2013  . Weakness 08/05/2013  . Type II or unspecified type diabetes mellitus without mention of complication, not stated as uncontrolled 08/05/2013  . Essential hypertension, benign 08/05/2013  . Depression with anxiety 08/05/2013  . Alcohol abuse 08/05/2013  . Facial droop 07/31/2013  . CVA (cerebral infarction) 07/31/2013   Past Medical History:  Past Medical History  Diagnosis Date  . Diabetes mellitus without complication   . Hypertension    Past Surgical History:  Past Surgical History  Procedure Laterality Date  . Prostate surgery    . Knee surgery     Social History:  reports that he has quit smoking. He does not have any smokeless tobacco history on file. He reports that he drinks alcohol. He reports that he does not use illicit drugs.  Family / Support Systems Marital Status: Divorced Patient Roles: Parent Children:  [name] [employer] - son [phone] Other Supports: dtr Anticipated Caregiver: Anticipate the need for SNF since son works fulltime Ability/Limitations of Caregiver: Son is working Community education officer. Dtr is not very involved. Caregiver Availability: Other (Comment) (Pt will likely need 24/7 supervision and does not have family to assist with that.  Family will probably pursue SNF for pt/) Family Dynamics: Son is supportive.  Pt reports they are close.  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  Pt is used to being active. Legal  Hisotry/Current Legal Issues: None Guardian/Conservator: N/A   Abuse/Neglect Physical Abuse: Denies Verbal Abuse: Denies Sexual Abuse: Denies Exploitation of patient/patient's resources: Denies Self-Neglect: Denies  Emotional Status Pt's affect, behavior and adjustment status: Pt is hopeful that he is beginning to see changes in his condition.  He was more anxious in the beginning of his hospitalization, but is not feeling that way now. Recent Psychosocial Issues: None reported Pyschiatric History: Pt reports feeling down and anxious at the beginning of his hospitatlization, but states he is feeling better now.  Pt denied any other psychiatric hx PTA. Substance Abuse History: None reported, however chart indicates pt drinks 4-5 beers/day.  Pt did not disclose this with CSW.  Patient / Family Perceptions, Expectations & Goals Pt/Family understanding of illness & functional limitations: Pt feels he has received good information from the doctors and understands his conditions and limitations.  Son had no questions for CSW. Premorbid pt/family roles/activities: Pt was working with his son and enjoys hunting and fishing.  Pt was independent prior to this stroke. Anticipated changes in roles/activities/participation: Pt will need 24/7 supervision to complete tasks he used to do independently. Pt/family expectations/goals: Pt wants "to get out of here in 2 or 3 weeks."  [employer]: None Premorbid Home Care/DME Agencies: None Transportation available at discharge: son Resource referrals recommended: Neuropsychology;Support group (specify)  Discharge Planning Living Arrangements: Alone Support Systems: Children;Other relatives Type of Residence: Private residence Insurance Resources: Administrator (specify) ([employer]) Surveyor, quantity Resources: Employment;Social Theatre manager Screen Referred: No Living Expenses: Database administrator Management:  Patient Does the patient have any problems obtaining your medications?: No Home Management: Pt  was doing all of this for himself. Patient/Family Preliminary Plans: Pt would like to return home from rehab, but will not have a 24/7 caregiver.  Pt will likely need SNF after rehab stay. Barriers to Discharge: Steps;Family Support Social Work Anticipated Follow Up Needs: HH/OP;SNF Expected length of stay: 12-16 days per PT/OT evaluations.  Pt still with NG tube and is NPO  Clinical Impression CSW met with pt to introduce self/role and to complete assessment.  No family was present at the time, so CSW spoke with son, [name] via telephone.  Pt was independent and living alone prior to stroke and now he will need 24/7 supervision at d/c.  Pt was unaware of the plan at d/c from rehab and he gave CSW permission to talk with his son.  Son stated that he needs to work and there is really no one who can be with him 24/7.  CSW asked if son would want for [place] to pursue SNF placement after rehab stay and he feels that is their only option.  CSW mentioned this option to pt as well.  Pt reported feeling down and anxious at beginning of hospitalization, but that his mood has improved and he is feeling better now that he's had time to absorb everything.  CSW informed pt that CSW will continue to assess pt for this.  He was accepting of this.  CSW will continue to follow pt and will assist with d/c plan.  Teshia Mahone, [place] 08/06/2013, 2:17 PM

## 2013-08-07 DIAGNOSIS — I69991 Dysphagia following unspecified cerebrovascular disease: Secondary | ICD-10-CM

## 2013-08-07 DIAGNOSIS — I6330 Cerebral infarction due to thrombosis of unspecified cerebral artery: Secondary | ICD-10-CM

## 2013-08-07 DIAGNOSIS — I69993 Ataxia following unspecified cerebrovascular disease: Secondary | ICD-10-CM

## 2013-08-07 LAB — GLUCOSE, CAPILLARY
Glucose-Capillary: 166 mg/dL — ABNORMAL HIGH (ref 70–99)
Glucose-Capillary: 146 mg/dL — ABNORMAL HIGH (ref 70–99)

## 2013-08-07 NOTE — Progress Notes (Signed)
67 y.o. male with history of DM, HTN, who was admitted on 07/31/13 with left facial droop with dysarthria and one week history of imbalance as diplopia X 2 days. MRI/MRA brain done revealing two small acute brainstem infarcts in-right paracentral pons, and central to left paracentral medulla possibly involving left pyramid, no stenosis. 2D echo with EF 55-60% and grade 1 diastolic dysfunction. Carotid dopplers with 1-39% ICA stenosis. Neurology services consulted maintained on aspirin and Plavix therapy for CVA prophylaxis. Subcutaneous heparin added for DVT prophylaxis.Marland Kitchen MBS done 08/01/13 alternative nutritional means and NPO recommended    Subjective/Complaints: No new issues. Occasional cough. No distress.   Review of Systems - Negative except Left-sided weakness, coughing Objective: Vital Signs: Blood pressure 157/83, pulse 74, temperature 98.6 F (37 C), temperature source Oral, resp. rate 17, weight 77.3 kg (170 lb 6.7 oz), SpO2 97.00%. Dg Chest 2 View  08/06/2013   CLINICAL DATA:  Cough, on tube feedings, status post CVA  EXAM: CHEST  2 VIEW  COMPARISON:  Chest x-ray dated July 31, 2013.  FINDINGS: The radiodense feeding tube bulb lies in the region of the proximal gastric body. Advancement by a 10-20 cm would be useful to assure that the tip remains below the GE junction.  The lungs are adequately inflated and clear. The cardiopericardial silhouette is normal in size. The pulmonary vascularity is not engorged. The mediastinum is normal in width. There is no pleural effusion or pneumothorax. The observed portions of the bony thorax appear normal.  IMPRESSION: 1. There is no evidence of atelectasis or pneumonia nor other acute cardiopulmonary abnormality. 2. Advancement of the feeding tube by 10-20 cm is recommended.   Electronically Signed   By: David  [place]   On: 08/06/2013 10:03   Results for orders placed during the hospital encounter of 08/05/13 (from the past 72 hour(s))  GLUCOSE,  CAPILLARY     Status: Abnormal   Collection Time    08/05/13  5:20 PM      Result Value Range   Glucose-Capillary 154 (*) 70 - 99 mg/dL   Comment 1 Notify RN    CBC     Status: Abnormal   Collection Time    08/05/13  6:35 PM      Result Value Range   WBC 6.7  4.0 - 10.5 K/uL   RBC 3.89 (*) 4.22 - 5.81 MIL/uL   Hemoglobin 13.4  13.0 - 17.0 g/dL   HCT 54.0 (*) 98.1 - 19.1 %   MCV 94.6  78.0 - 100.0 fL   MCH 34.4 (*) 26.0 - 34.0 pg   MCHC 36.4 (*) 30.0 - 36.0 g/dL   RDW 47.8  29.5 - 62.1 %   Platelets 193  150 - 400 K/uL  CREATININE, SERUM     Status: None   Collection Time    08/05/13  6:35 PM      Result Value Range   Creatinine, Ser 0.56  0.50 - 1.35 mg/dL   GFR calc non Af Amer >90  >90 mL/min   GFR calc Af Amer >90  >90 mL/min   Comment: (NOTE)     The eGFR has been calculated using the CKD EPI equation.     This calculation has not been validated in all clinical situations.     eGFR's persistently <90 mL/min signify possible Chronic Kidney     Disease.  GLUCOSE, CAPILLARY     Status: Abnormal   Collection Time    08/05/13  8:26 PM  Result Value Range   Glucose-Capillary 148 (*) 70 - 99 mg/dL  GLUCOSE, CAPILLARY     Status: Abnormal   Collection Time    08/05/13 11:55 PM      Result Value Range   Glucose-Capillary 163 (*) 70 - 99 mg/dL   Comment 1 Notify RN    GLUCOSE, CAPILLARY     Status: Abnormal   Collection Time    08/06/13  3:53 AM      Result Value Range   Glucose-Capillary 147 (*) 70 - 99 mg/dL   Comment 1 Notify RN    CBC WITH DIFFERENTIAL     Status: Abnormal   Collection Time    08/06/13  5:45 AM      Result Value Range   WBC 6.9  4.0 - 10.5 K/uL   RBC 3.75 (*) 4.22 - 5.81 MIL/uL   Hemoglobin 12.7 (*) 13.0 - 17.0 g/dL   HCT 16.1 (*) 09.6 - 04.5 %   MCV 94.7  78.0 - 100.0 fL   MCH 33.9  26.0 - 34.0 pg   MCHC 35.8  30.0 - 36.0 g/dL   RDW 40.9  81.1 - 91.4 %   Platelets 184  150 - 400 K/uL   Neutrophils Relative % 61  43 - 77 %   Neutro Abs  4.2  1.7 - 7.7 K/uL   Lymphocytes Relative 23  12 - 46 %   Lymphs Abs 1.6  0.7 - 4.0 K/uL   Monocytes Relative 14 (*) 3 - 12 %   Monocytes Absolute 0.9  0.1 - 1.0 K/uL   Eosinophils Relative 2  0 - 5 %   Eosinophils Absolute 0.2  0.0 - 0.7 K/uL   Basophils Relative 0  0 - 1 %   Basophils Absolute 0.0  0.0 - 0.1 K/uL  COMPREHENSIVE METABOLIC PANEL     Status: Abnormal   Collection Time    08/06/13  5:45 AM      Result Value Range   Sodium 137  135 - 145 mEq/L   Potassium 3.6  3.5 - 5.1 mEq/L   Chloride 102  96 - 112 mEq/L   CO2 27  19 - 32 mEq/L   Glucose, Bld 168 (*) 70 - 99 mg/dL   BUN 9  6 - 23 mg/dL   Creatinine, Ser 7.82  0.50 - 1.35 mg/dL   Calcium 8.5  8.4 - 95.6 mg/dL   Total Protein 6.1  6.0 - 8.3 g/dL   Albumin 3.1 (*) 3.5 - 5.2 g/dL   AST 18  0 - 37 U/L   ALT 36  0 - 53 U/L   Alkaline Phosphatase 51  39 - 117 U/L   Total Bilirubin 0.4  0.3 - 1.2 mg/dL   GFR calc non Af Amer >90  >90 mL/min   GFR calc Af Amer >90  >90 mL/min   Comment: (NOTE)     The eGFR has been calculated using the CKD EPI equation.     This calculation has not been validated in all clinical situations.     eGFR's persistently <90 mL/min signify possible Chronic Kidney     Disease.  GLUCOSE, CAPILLARY     Status: Abnormal   Collection Time    08/06/13  7:40 AM      Result Value Range   Glucose-Capillary 155 (*) 70 - 99 mg/dL  GLUCOSE, CAPILLARY     Status: Abnormal   Collection Time    08/06/13 12:02 PM  Result Value Range   Glucose-Capillary 143 (*) 70 - 99 mg/dL  GLUCOSE, CAPILLARY     Status: Abnormal   Collection Time    08/06/13  4:06 PM      Result Value Range   Glucose-Capillary 154 (*) 70 - 99 mg/dL  GLUCOSE, CAPILLARY     Status: Abnormal   Collection Time    08/06/13  8:33 PM      Result Value Range   Glucose-Capillary 168 (*) 70 - 99 mg/dL   Comment 1 Notify RN    GLUCOSE, CAPILLARY     Status: Abnormal   Collection Time    08/06/13 11:55 PM      Result Value Range    Glucose-Capillary 134 (*) 70 - 99 mg/dL  GLUCOSE, CAPILLARY     Status: Abnormal   Collection Time    08/07/13  3:59 AM      Result Value Range   Glucose-Capillary 153 (*) 70 - 99 mg/dL   Comment 1 Notify RN    GLUCOSE, CAPILLARY     Status: Abnormal   Collection Time    08/07/13  7:45 AM      Result Value Range   Glucose-Capillary 166 (*) 70 - 99 mg/dL     HEENT: normal Cardio: RRR and no murmur Resp: CTA B/L and unlabored GI: BS positive and non distended Extremity:  Pulses positive and No Edema Skin:   Intact Neuro: a little slow this am, Cranial Nerve Abnormalities Left central 7, Abnormal Motor 2-/5 LUE, 3-/5 LLE, 5/5 on  Right side, Abnormal FMC Ataxic/ dec FMC and Dysarthric Musc/Skel:  Normal GEN: NAD   Assessment/Plan: 1. Functional deficits secondary to Right pontine and Left medullary infarcts which require 3+ hours per day of interdisciplinary therapy in a comprehensive inpatient rehab setting. Physiatrist is providing close team supervision and 24 hour management of active medical problems listed below. Physiatrist and rehab team continue to assess barriers to discharge/monitor patient progress toward functional and medical goals. FIM: FIM - Bathing Bathing Steps Patient Completed: Chest;Right Arm;Left Arm;Abdomen;Front perineal area;Buttocks;Right upper leg;Left upper leg;Right lower leg (including foot) Bathing: 4: Steadying assist  FIM - Upper Body Dressing/Undressing Upper body dressing/undressing steps patient completed: Thread/unthread left sleeve of front closure shirt/dress;Pull shirt around back of front closure shirt/dress Upper body dressing/undressing: 3: Mod-Patient completed 50-74% of tasks FIM - Lower Body Dressing/Undressing Lower body dressing/undressing steps patient completed: Thread/unthread right underwear leg;Thread/unthread left underwear leg;Pull underwear up/down;Thread/unthread right pants leg;Don/Doff left sock;Thread/unthread left  pants leg;Don/Doff right sock;Fasten/unfasten pants;Pull pants up/down Lower body dressing/undressing: 4: Min-Patient completed 75 plus % of tasks        FIM - Banker Devices: Arm rests Bed/Chair Transfer: 3: Supine > Sit: Mod A (lifting assist/Pt. 50-74%/lift 2 legs;4: Sit > Supine: Min A (steadying pt. > 75%/lift 1 leg);3: Bed > Chair or W/C: Mod A (lift or lower assist);3: Chair or W/C > Bed: Mod A (lift or lower assist)  FIM - Locomotion: Wheelchair Distance: 100 Locomotion: Wheelchair: 1: Travels less than 50 ft with moderate assistance (Pt: 50 - 74%) FIM - Locomotion: Ambulation Locomotion: Ambulation Assistive Devices: Walker - Rolling (without AD and with RW) Ambulation/Gait Assistance: 3: Mod assist Locomotion: Ambulation: 2: Travels 50 - 149 ft with moderate assistance (Pt: 50 - 74%)  Comprehension Comprehension Mode: Auditory Comprehension: 5-Follows basic conversation/direction: With no assist  Expression Expression Mode: Verbal Expression: 5-Expresses basic needs/ideas: With no assist  Social Interaction Social Interaction: 4-Interacts appropriately  75 - 89% of the time - Needs redirection for appropriate language or to initiate interaction.  Problem Solving Problem Solving: 3-Solves basic 50 - 74% of the time/requires cueing 25 - 49% of the time  Memory Memory: 4-Recognizes or recalls 75 - 89% of the time/requires cueing 10 - 24% of the time  Medical Problem List and Plan:  1. Thrombotic left medullary, right paracentral pontine infarct  2. DVT Prophylaxis/Anticoagulation: Subcutaneous heparin. Monitor platelet counts and any signs of bleeding  3. Pain Management: Tylenol as needed  4. Mood: Zoloft 50 mg daily, Ativan 0.5 mg every 4 as needed anxiety  5. Neuropsych: This patient is capable of making decisions on his own behalf.  6. Dysphagia. Patient currently n.p.o. with nasogastric tube feeds.   7. Hypertension.  Norvasc 5 mg daily.  Borderline controlled at present  8. Hyperlipidemia. Lipitor  9. Diabetes mellitus with peripheral neuropathy. controlledHemoglobin A1c 7.4. Continue sliding scale insulin. Patient on Glucophage 500 mg twice a day prior to admission. May resume soon, but for the most part sugars have been consistently in the 130-160 range.     LOS (Days) 2 A FACE TO FACE EVALUATION WAS PERFORMED  Keith [name] T 08/07/2013, 9:20 AM

## 2013-08-08 ENCOUNTER — Inpatient Hospital Stay (HOSPITAL_COMMUNITY): Payer: Medicare Other | Admitting: *Deleted

## 2013-08-08 ENCOUNTER — Inpatient Hospital Stay (HOSPITAL_COMMUNITY): Payer: Medicare Other | Admitting: Occupational Therapy

## 2013-08-08 ENCOUNTER — Inpatient Hospital Stay (HOSPITAL_COMMUNITY): Payer: Medicare Other | Admitting: Physical Therapy

## 2013-08-08 LAB — GLUCOSE, CAPILLARY
Glucose-Capillary: 147 mg/dL — ABNORMAL HIGH (ref 70–99)
Glucose-Capillary: 155 mg/dL — ABNORMAL HIGH (ref 70–99)
Glucose-Capillary: 171 mg/dL — ABNORMAL HIGH (ref 70–99)
Glucose-Capillary: 173 mg/dL — ABNORMAL HIGH (ref 70–99)

## 2013-08-08 NOTE — Progress Notes (Addendum)
Physical Therapy Session Note  Patient Details  Name: Keith [name] MRN: [id number] Date of Birth: [birth year]  Today's Date: 08/08/2013 Time: 0800-0905 Time Calculation (min): 65 min   Skilled Therapeutic Interventions/Progress Updates:  At the beginning of therapy Patient being transfered with nurse to the w/c with max A, transfer to the toilet with mod A for LOB and with cues for sequencing. Toleting activities with mod A , had washing in standing with min A , L knee buckle ,manual assistance needed to maintain standing. W/c propulsion to the therapy gym with min A and constant cues for path following.Nu Step 1 x 4 min and 1 x6 min on level 6, patient reports level 13 on Borg exertion scale. Gait training 2 x 110 feet with use if 2 wheel walker and mod A due to LOB, verbal cues for sequencing and step length.  Seated LAQ 2 x 15 with 5 lbs on B LE. Sit to stand 5 x w/o of use of UE with min A.  Standing balance training w/o support, patient losses support after about 30-45 seconds and continues to need mod A  For balance. Alternate stepping to tap a cone with B LE with max A due to poor weight shifting and decreased muscle strength. Patient propelled w/c with mod back to his room , left in his w/c with quick release belt with all needs within reach. Nursing notified to reconnect NGT.   Therapy Documentation Precautions:  Precautions Precautions: Fall Precaution Comments: right side head rotation and tilt, dysarthria, left hemiparesis Restrictions Weight Bearing Restrictions: No Pain:    See FIM for current functional status  Therapy/Group: Individual Therapy  Dorna Mai 08/08/2013, 9:11 AM

## 2013-08-08 NOTE — Progress Notes (Signed)
Occupational Therapy Session Note  Patient Details  Name: Keith [name] MRN: [id number] Date of Birth: 1945/12/16  Today's Date: 08/08/2013 Time: 1415-1500 Time Calculation (min): 45 min  Skilled Therapeutic Interventions/Progress Updates: Patient seen this pm to work on bilateral UE coordination, motor control, FM and dexterity to increase independence with self care such as buttons, fastners, toiletry lids, etc.  During the activity, at times, the Patient stated that his hands "were tight" and stretched them and opened and closed them from time to time.     Therapy Documentation Precautions:  Precautions Precautions: Fall Precaution Comments: right side head rotation and tilt, dysarthria, left hemiparesis Restrictions Weight Bearing Restrictions: No  Pain:denied   See FIM for current functional status  Therapy/Group: Individual Therapy  [name] [facility] 08/08/2013, 4:05 PM

## 2013-08-08 NOTE — Progress Notes (Signed)
Occupational Therapy Session Note  Patient Details  Name: Keith [name] MRN: [id number] Date of Birth: 03/10/1946  Today's Date: 08/08/2013 Time: 1308-6578 Time Calculation (min): 60 min  Skilled Therapeutic Interventions/Progress Updates:ADL in w/c at sink with focus on dynamic standing balance and accuracy of UEs throughout digits to wash and dress self and groom.  Patient was able to use L hand to complete bilateral tasks even though he stated his left "doesn't work as good as the right."  His left digits required extra time as he did a remarkable job of buttoning one of his pajama top buttons.  Patient did a safe job of being cognizant of and working around his NG tube.     Therapy Documentation Precautions:  Precautions Precautions: Fall Precaution Comments: right side head rotation and tilt, dysarthria, left hemiparesis Restrictions Weight Bearing Restrictions: No  Pain:denied   See FIM for current functional status  Therapy/Group: Individual Therapy  [name] [facility] 08/08/2013, 2:43 PM

## 2013-08-08 NOTE — Progress Notes (Signed)
Physical Therapy Note  Patient Details  Name: Zeek Rostron MRN: [id number] Date of Birth: [birth year] Today's Date: 08/08/2013  4098-1191 (30 minutes) individual Pain: no complaint of pain Other: no complaint of dizziness or nausea Focus of treatment: therapeutic activities focused on standing balance; gait training Treatment: Pt in bed upon arrival; supine to sit SBA with bed rail; transfer stand/turn min assist; gait 60 feet x 2 RW min/mod assist for balance with vcs to increased BOS to right; standing performing rainbow arc in normal stance and in tandem to challenge balance without UE assist (min assist for safety); tall kneeling with decreased trunk extension X 2 minutes; returned to room with quick release belt in place.    Dannon Perlow,JIM 08/08/2013, 3:39 PM

## 2013-08-08 NOTE — Progress Notes (Signed)
67 y.o. male with history of DM, HTN, who was admitted on 07/31/13 with left facial droop with dysarthria and one week history of imbalance as diplopia X 2 days. MRI/MRA brain done revealing two small acute brainstem infarcts in-right paracentral pons, and central to left paracentral medulla possibly involving left pyramid, no stenosis. 2D echo with EF 55-60% and grade 1 diastolic dysfunction. Carotid dopplers with 1-39% ICA stenosis. Neurology services consulted maintained on aspirin and Plavix therapy for CVA prophylaxis. Subcutaneous heparin added for DVT prophylaxis.Marland Kitchen MBS done 08/01/13 alternative nutritional means and NPO recommended    Subjective/Complaints: Complains of mild headache. Tylenol helps it. No issues this am. Slept well.  Review of Systems - otherwise negative   Objective: Vital Signs: Blood pressure 153/80, pulse 58, temperature 98.2 F (36.8 C), temperature source Oral, resp. rate 18, weight 78.6 kg (173 lb 4.5 oz), SpO2 93.00%. Dg Chest 2 View  08/06/2013   CLINICAL DATA:  Cough, on tube feedings, status post CVA  EXAM: CHEST  2 VIEW  COMPARISON:  Chest x-ray dated July 31, 2013.  FINDINGS: The radiodense feeding tube bulb lies in the region of the proximal gastric body. Advancement by a 10-20 cm would be useful to assure that the tip remains below the GE junction.  The lungs are adequately inflated and clear. The cardiopericardial silhouette is normal in size. The pulmonary vascularity is not engorged. The mediastinum is normal in width. There is no pleural effusion or pneumothorax. The observed portions of the bony thorax appear normal.  IMPRESSION: 1. There is no evidence of atelectasis or pneumonia nor other acute cardiopulmonary abnormality. 2. Advancement of the feeding tube by 10-20 cm is recommended.   Electronically Signed   By: David  [place]   On: 08/06/2013 10:03   Results for orders placed during the hospital encounter of 08/05/13 (from the past 72 hour(s))   GLUCOSE, CAPILLARY     Status: Abnormal   Collection Time    08/05/13  5:20 PM      Result Value Range   Glucose-Capillary 154 (*) 70 - 99 mg/dL   Comment 1 Notify RN    CBC     Status: Abnormal   Collection Time    08/05/13  6:35 PM      Result Value Range   WBC 6.7  4.0 - 10.5 K/uL   RBC 3.89 (*) 4.22 - 5.81 MIL/uL   Hemoglobin 13.4  13.0 - 17.0 g/dL   HCT 16.1 (*) 09.6 - 04.5 %   MCV 94.6  78.0 - 100.0 fL   MCH 34.4 (*) 26.0 - 34.0 pg   MCHC 36.4 (*) 30.0 - 36.0 g/dL   RDW 40.9  81.1 - 91.4 %   Platelets 193  150 - 400 K/uL  CREATININE, SERUM     Status: None   Collection Time    08/05/13  6:35 PM      Result Value Range   Creatinine, Ser 0.56  0.50 - 1.35 mg/dL   GFR calc non Af Amer >90  >90 mL/min   GFR calc Af Amer >90  >90 mL/min   Comment: (NOTE)     The eGFR has been calculated using the CKD EPI equation.     This calculation has not been validated in all clinical situations.     eGFR's persistently <90 mL/min signify possible Chronic Kidney     Disease.  GLUCOSE, CAPILLARY     Status: Abnormal   Collection Time    08/05/13  8:26 PM      Result Value Range   Glucose-Capillary 148 (*) 70 - 99 mg/dL  GLUCOSE, CAPILLARY     Status: Abnormal   Collection Time    08/05/13 11:55 PM      Result Value Range   Glucose-Capillary 163 (*) 70 - 99 mg/dL   Comment 1 Notify RN    GLUCOSE, CAPILLARY     Status: Abnormal   Collection Time    08/06/13  3:53 AM      Result Value Range   Glucose-Capillary 147 (*) 70 - 99 mg/dL   Comment 1 Notify RN    CBC WITH DIFFERENTIAL     Status: Abnormal   Collection Time    08/06/13  5:45 AM      Result Value Range   WBC 6.9  4.0 - 10.5 K/uL   RBC 3.75 (*) 4.22 - 5.81 MIL/uL   Hemoglobin 12.7 (*) 13.0 - 17.0 g/dL   HCT 40.9 (*) 81.1 - 91.4 %   MCV 94.7  78.0 - 100.0 fL   MCH 33.9  26.0 - 34.0 pg   MCHC 35.8  30.0 - 36.0 g/dL   RDW 78.2  95.6 - 21.3 %   Platelets 184  150 - 400 K/uL   Neutrophils Relative % 61  43 - 77 %    Neutro Abs 4.2  1.7 - 7.7 K/uL   Lymphocytes Relative 23  12 - 46 %   Lymphs Abs 1.6  0.7 - 4.0 K/uL   Monocytes Relative 14 (*) 3 - 12 %   Monocytes Absolute 0.9  0.1 - 1.0 K/uL   Eosinophils Relative 2  0 - 5 %   Eosinophils Absolute 0.2  0.0 - 0.7 K/uL   Basophils Relative 0  0 - 1 %   Basophils Absolute 0.0  0.0 - 0.1 K/uL  COMPREHENSIVE METABOLIC PANEL     Status: Abnormal   Collection Time    08/06/13  5:45 AM      Result Value Range   Sodium 137  135 - 145 mEq/L   Potassium 3.6  3.5 - 5.1 mEq/L   Chloride 102  96 - 112 mEq/L   CO2 27  19 - 32 mEq/L   Glucose, Bld 168 (*) 70 - 99 mg/dL   BUN 9  6 - 23 mg/dL   Creatinine, Ser 0.86  0.50 - 1.35 mg/dL   Calcium 8.5  8.4 - 57.8 mg/dL   Total Protein 6.1  6.0 - 8.3 g/dL   Albumin 3.1 (*) 3.5 - 5.2 g/dL   AST 18  0 - 37 U/L   ALT 36  0 - 53 U/L   Alkaline Phosphatase 51  39 - 117 U/L   Total Bilirubin 0.4  0.3 - 1.2 mg/dL   GFR calc non Af Amer >90  >90 mL/min   GFR calc Af Amer >90  >90 mL/min   Comment: (NOTE)     The eGFR has been calculated using the CKD EPI equation.     This calculation has not been validated in all clinical situations.     eGFR's persistently <90 mL/min signify possible Chronic Kidney     Disease.  GLUCOSE, CAPILLARY     Status: Abnormal   Collection Time    08/06/13  7:40 AM      Result Value Range   Glucose-Capillary 155 (*) 70 - 99 mg/dL  GLUCOSE, CAPILLARY     Status: Abnormal   Collection Time  08/06/13 12:02 PM      Result Value Range   Glucose-Capillary 143 (*) 70 - 99 mg/dL  GLUCOSE, CAPILLARY     Status: Abnormal   Collection Time    08/06/13  4:06 PM      Result Value Range   Glucose-Capillary 154 (*) 70 - 99 mg/dL  GLUCOSE, CAPILLARY     Status: Abnormal   Collection Time    08/06/13  8:33 PM      Result Value Range   Glucose-Capillary 168 (*) 70 - 99 mg/dL   Comment 1 Notify RN    GLUCOSE, CAPILLARY     Status: Abnormal   Collection Time    08/06/13 11:55 PM      Result  Value Range   Glucose-Capillary 134 (*) 70 - 99 mg/dL  GLUCOSE, CAPILLARY     Status: Abnormal   Collection Time    08/07/13  3:59 AM      Result Value Range   Glucose-Capillary 153 (*) 70 - 99 mg/dL   Comment 1 Notify RN    GLUCOSE, CAPILLARY     Status: Abnormal   Collection Time    08/07/13  7:45 AM      Result Value Range   Glucose-Capillary 166 (*) 70 - 99 mg/dL  GLUCOSE, CAPILLARY     Status: Abnormal   Collection Time    08/07/13 11:59 AM      Result Value Range   Glucose-Capillary 172 (*) 70 - 99 mg/dL  GLUCOSE, CAPILLARY     Status: Abnormal   Collection Time    08/07/13  4:45 PM      Result Value Range   Glucose-Capillary 178 (*) 70 - 99 mg/dL  GLUCOSE, CAPILLARY     Status: Abnormal   Collection Time    08/07/13  8:07 PM      Result Value Range   Glucose-Capillary 146 (*) 70 - 99 mg/dL   Comment 1 Notify RN    GLUCOSE, CAPILLARY     Status: Abnormal   Collection Time    08/08/13 12:07 AM      Result Value Range   Glucose-Capillary 171 (*) 70 - 99 mg/dL   Comment 1 Notify RN    GLUCOSE, CAPILLARY     Status: Abnormal   Collection Time    08/08/13  4:07 AM      Result Value Range   Glucose-Capillary 155 (*) 70 - 99 mg/dL  GLUCOSE, CAPILLARY     Status: Abnormal   Collection Time    08/08/13  7:35 AM      Result Value Range   Glucose-Capillary 173 (*) 70 - 99 mg/dL   Comment 1 Notify RN       HEENT: normal Cardio: RRR and no murmur Resp: CTA B/L and unlabored GI: BS positive and non distended Extremity:  Pulses positive and No Edema Skin:   Intact Neuro: a little slow this am, Cranial Nerve Abnormalities Left central 7, Abnormal Motor 2-/5 LUE, 3-/5 LLE, 5/5 on  Right side, Abnormal FMC Ataxic/ dec FMC and Dysarthric Musc/Skel:  Normal GEN: NAD   Assessment/Plan: 1. Functional deficits secondary to Right pontine and Left medullary infarcts which require 3+ hours per day of interdisciplinary therapy in a comprehensive inpatient rehab  setting. Physiatrist is providing close team supervision and 24 hour management of active medical problems listed below. Physiatrist and rehab team continue to assess barriers to discharge/monitor patient progress toward functional and medical goals. FIM: FIM - Bathing  Bathing Steps Patient Completed: Chest;Right Arm;Left Arm;Abdomen;Front perineal area;Buttocks;Right upper leg;Left upper leg;Right lower leg (including foot) Bathing: 4: Steadying assist  FIM - Upper Body Dressing/Undressing Upper body dressing/undressing steps patient completed: Thread/unthread left sleeve of front closure shirt/dress;Pull shirt around back of front closure shirt/dress Upper body dressing/undressing: 3: Mod-Patient completed 50-74% of tasks FIM - Lower Body Dressing/Undressing Lower body dressing/undressing steps patient completed: Thread/unthread right underwear leg;Thread/unthread left underwear leg;Pull underwear up/down;Thread/unthread right pants leg;Don/Doff left sock;Thread/unthread left pants leg;Don/Doff right sock;Fasten/unfasten pants;Pull pants up/down Lower body dressing/undressing: 4: Min-Patient completed 75 plus % of tasks     FIM - Diplomatic Services operational officer Devices: Grab bars Toilet Transfers: 3-To toilet/BSC: Mod A (lift or lower assist)  FIM - Banker Devices: Arm rests Bed/Chair Transfer: 3: Chair or W/C > Bed: Mod A (lift or lower assist)  FIM - Locomotion: Wheelchair Distance: 100 Locomotion: Wheelchair: 2: Travels 50 - 149 ft with moderate assistance (Pt: 50 - 74%) FIM - Locomotion: Ambulation Locomotion: Ambulation Assistive Devices: Designer, industrial/product Ambulation/Gait Assistance: 3: Mod assist Locomotion: Ambulation: 2: Travels 50 - 149 ft with moderate assistance (Pt: 50 - 74%)  Comprehension Comprehension Mode: Auditory Comprehension: 5-Follows basic conversation/direction: With no assist  Expression Expression  Mode: Verbal Expression: 5-Expresses basic needs/ideas: With no assist  Social Interaction Social Interaction: 4-Interacts appropriately 75 - 89% of the time - Needs redirection for appropriate language or to initiate interaction.  Problem Solving Problem Solving: 3-Solves basic 50 - 74% of the time/requires cueing 25 - 49% of the time  Memory Memory: 4-Recognizes or recalls 75 - 89% of the time/requires cueing 10 - 24% of the time  Medical Problem List and Plan:  1. Thrombotic left medullary, right paracentral pontine infarct  2. DVT Prophylaxis/Anticoagulation: Subcutaneous heparin. Monitor platelet counts and any signs of bleeding  3. Pain Management: Tylenol as needed for headache--this has been effective 4. Mood: Zoloft 50 mg daily, Ativan 0.5 mg every 4 as needed anxiety  5. Neuropsych: This patient is capable of making decisions on his own behalf.  6. Dysphagia. Patient currently n.p.o. with nasogastric tube feeds.   7. Hypertension. Norvasc 5 mg daily.  Borderline controlled at present  8. Hyperlipidemia. Lipitor  9. Diabetes mellitus with peripheral neuropathy. controlledHemoglobin A1c 7.4. Continue sliding scale insulin. Patient on Glucophage 500 mg twice a day prior to admission. May resume soon, but for the most part sugars have been consistently in the 130-160 range. --no changes at present    LOS (Days) 3 A FACE TO FACE EVALUATION WAS PERFORMED  SWARTZ,ZACHARY T 08/08/2013, 8:45 AM

## 2013-08-09 ENCOUNTER — Inpatient Hospital Stay (HOSPITAL_COMMUNITY): Payer: Medicare Other | Admitting: Speech Pathology

## 2013-08-09 ENCOUNTER — Inpatient Hospital Stay (HOSPITAL_COMMUNITY): Payer: No Typology Code available for payment source | Admitting: Rehabilitation

## 2013-08-09 ENCOUNTER — Encounter (HOSPITAL_COMMUNITY): Payer: No Typology Code available for payment source

## 2013-08-09 ENCOUNTER — Encounter (HOSPITAL_COMMUNITY): Payer: No Typology Code available for payment source | Admitting: Occupational Therapy

## 2013-08-09 DIAGNOSIS — I10 Essential (primary) hypertension: Secondary | ICD-10-CM

## 2013-08-09 DIAGNOSIS — F1010 Alcohol abuse, uncomplicated: Secondary | ICD-10-CM

## 2013-08-09 LAB — GLUCOSE, CAPILLARY
Glucose-Capillary: 145 mg/dL — ABNORMAL HIGH (ref 70–99)
Glucose-Capillary: 146 mg/dL — ABNORMAL HIGH (ref 70–99)
Glucose-Capillary: 169 mg/dL — ABNORMAL HIGH (ref 70–99)
Glucose-Capillary: 157 mg/dL — ABNORMAL HIGH (ref 70–99)

## 2013-08-09 NOTE — Progress Notes (Signed)
Speech Language Pathology Daily Session Note  Patient Details  Name: Keith [name] MRN: [id number] Date of Birth: Sep 22, 1945  Today's Date: 08/09/2013 Time: 1350-1430 Time Calculation (min): 40 min  Short Term Goals: Week 1: SLP Short Term Goal 1 (Week 1): Pt will perform pharyngeal strengthening exercises with Mod cues SLP Short Term Goal 2 (Week 1): Pt will initiate a volitional pharyngeal swallow x10 with Min cues SLP Short Term Goal 3 (Week 1): Pt will utilize speech intelligibility strategies with Min cues SLP Short Term Goal 4 (Week 1): Pt will perform oral motor exercises with Min cues  Skilled Therapeutic Interventions: Skilled treatment session focused on addressing dysphagia and self-care goals.  SLP facilitated session with set-up of oral care and Supervision verbal cues for thoroughness.  SLP also facilitated session with ice chip trials x5 with 3-4 cued swallows per chip and throat clear x1.  Patient reports that he has been practicing dry swallows in his room throughout the day.  Recommend repeat objective study given functional bedside improvements in swallow trigger today and length of time since previous objective study.       FIM:  Comprehension Comprehension Mode: Auditory Comprehension: 5-Follows basic conversation/direction: With no assist Expression Expression Mode: Verbal Expression: 5-Expresses basic 90% of the time/requires cueing < 10% of the time. Social Interaction Social Interaction: 4-Interacts appropriately 75 - 89% of the time - Needs redirection for appropriate language or to initiate interaction. Problem Solving Problem Solving: 3-Solves basic 50 - 74% of the time/requires cueing 25 - 49% of the time Memory Memory: 4-Recognizes or recalls 75 - 89% of the time/requires cueing 10 - 24% of the time  Pain Pain Assessment Pain Assessment: No/denies pain  Therapy/Group: Individual Therapy  Charlane Ferretti.,  CCC-SLP [phone]  Lamoyne Hessel 08/09/2013, 4:36 PM

## 2013-08-09 NOTE — Progress Notes (Signed)
Occupational Therapy Session Note  Patient Details  Name: Keith [name] MRN: [id number] Date of Birth: 04/17/1946  Today's Date: 08/09/2013 Time: 1001-1101 Time Calculation (min): 60 min  Short Term Goals: Week 1:  OT Short Term Goal 1 (Week 1): Pt will perform bathing sit to stand with close supervison at the sink. OT Short Term Goal 2 (Week 1): Pt will perform toilet transfer with min assist to 3:1. OT Short Term Goal 3 (Week 1): Pt will perform UB and LB dressing with supervision. OT Short Term Goal 4 (Week 1): Pt will use the LUE at a diminished level for selfcare tasks with supervision OT Short Term Goal 5 (Week 1): Pt will peform gross motor and FM coordination exercises with the LUE with supervison.  Skilled Therapeutic Interventions/Progress Updates:   Pt ambulated with the RW to gather clothes, towels, and washcloths for bathing at the sink.  Mod assist needed to use walker as pt demonstrated frequent LOB to the left, especially with turns.  Pt performed bathing and dressing with overall min assist for sit to stand.  He demonstrated decreased efficiency with attempting to fasten the larger buttons on his shirt, and therapist had to help him get the first one started.  Pt still with ataxic trunk in standing as well and ataxia in the LUE with functional use.   Therapy Documentation Precautions:  Precautions Precautions: Fall Precaution Comments: NPO, NG feeding tube in place, ataxia Restrictions Weight Bearing Restrictions: No  Pain: Pain Assessment Pain Assessment: No/denies pain ADL: See FIM for current functional status  Therapy/Group: Individual Therapy  Jaz Mallick OTR/L 08/09/2013, 12:24 PM

## 2013-08-09 NOTE — Progress Notes (Signed)
67 y.o. male with history of DM, HTN, who was admitted on 07/31/13 with left facial droop with dysarthria and one week history of imbalance as diplopia X 2 days. MRI/MRA brain done revealing two small acute brainstem infarcts in-right paracentral pons, and central to left paracentral medulla possibly involving left pyramid, no stenosis. 2D echo with EF 55-60% and grade 1 diastolic dysfunction. Carotid dopplers with 1-39% ICA stenosis. Neurology services consulted maintained on aspirin and Plavix therapy for CVA prophylaxis. Subcutaneous heparin added for DVT prophylaxis.Marland Kitchen MBS done 08/01/13 alternative nutritional means and NPO recommended    Subjective/Complaints: No SOB, no CP D/w speech therapy swallow re eval end of week  Review of Systems - otherwise negative   Objective: Vital Signs: Blood pressure 135/71, pulse 59, temperature 98.6 F (37 C), temperature source Oral, resp. rate 18, weight 78.4 kg (172 lb 13.5 oz), SpO2 98.00%. No results found. Results for orders placed during the hospital encounter of 08/05/13 (from the past 72 hour(s))  GLUCOSE, CAPILLARY     Status: Abnormal   Collection Time    08/06/13 12:02 PM      Result Value Range   Glucose-Capillary 143 (*) 70 - 99 mg/dL  GLUCOSE, CAPILLARY     Status: Abnormal   Collection Time    08/06/13  4:06 PM      Result Value Range   Glucose-Capillary 154 (*) 70 - 99 mg/dL  GLUCOSE, CAPILLARY     Status: Abnormal   Collection Time    08/06/13  8:33 PM      Result Value Range   Glucose-Capillary 168 (*) 70 - 99 mg/dL   Comment 1 Notify RN    GLUCOSE, CAPILLARY     Status: Abnormal   Collection Time    08/06/13 11:55 PM      Result Value Range   Glucose-Capillary 134 (*) 70 - 99 mg/dL  GLUCOSE, CAPILLARY     Status: Abnormal   Collection Time    08/07/13  3:59 AM      Result Value Range   Glucose-Capillary 153 (*) 70 - 99 mg/dL   Comment 1 Notify RN    GLUCOSE, CAPILLARY     Status: Abnormal   Collection Time     08/07/13  7:45 AM      Result Value Range   Glucose-Capillary 166 (*) 70 - 99 mg/dL  GLUCOSE, CAPILLARY     Status: Abnormal   Collection Time    08/07/13 11:59 AM      Result Value Range   Glucose-Capillary 172 (*) 70 - 99 mg/dL  GLUCOSE, CAPILLARY     Status: Abnormal   Collection Time    08/07/13  4:45 PM      Result Value Range   Glucose-Capillary 178 (*) 70 - 99 mg/dL  GLUCOSE, CAPILLARY     Status: Abnormal   Collection Time    08/07/13  8:07 PM      Result Value Range   Glucose-Capillary 146 (*) 70 - 99 mg/dL   Comment 1 Notify RN    GLUCOSE, CAPILLARY     Status: Abnormal   Collection Time    08/08/13 12:07 AM      Result Value Range   Glucose-Capillary 171 (*) 70 - 99 mg/dL   Comment 1 Notify RN    GLUCOSE, CAPILLARY     Status: Abnormal   Collection Time    08/08/13  4:07 AM      Result Value Range   Glucose-Capillary 155 (*)  70 - 99 mg/dL  GLUCOSE, CAPILLARY     Status: Abnormal   Collection Time    08/08/13  7:35 AM      Result Value Range   Glucose-Capillary 173 (*) 70 - 99 mg/dL   Comment 1 Notify RN    GLUCOSE, CAPILLARY     Status: Abnormal   Collection Time    08/08/13 11:34 AM      Result Value Range   Glucose-Capillary 172 (*) 70 - 99 mg/dL   Comment 1 Notify RN    GLUCOSE, CAPILLARY     Status: Abnormal   Collection Time    08/08/13  4:28 PM      Result Value Range   Glucose-Capillary 147 (*) 70 - 99 mg/dL   Comment 1 Notify RN    GLUCOSE, CAPILLARY     Status: Abnormal   Collection Time    08/08/13  7:54 PM      Result Value Range   Glucose-Capillary 146 (*) 70 - 99 mg/dL   Comment 1 Notify RN    GLUCOSE, CAPILLARY     Status: Abnormal   Collection Time    08/08/13 11:48 PM      Result Value Range   Glucose-Capillary 145 (*) 70 - 99 mg/dL   Comment 1 Notify RN    GLUCOSE, CAPILLARY     Status: Abnormal   Collection Time    08/09/13  4:06 AM      Result Value Range   Glucose-Capillary 173 (*) 70 - 99 mg/dL   Comment 1 Notify RN     GLUCOSE, CAPILLARY     Status: Abnormal   Collection Time    08/09/13  7:41 AM      Result Value Range   Glucose-Capillary 154 (*) 70 - 99 mg/dL   Comment 1 Notify RN       HEENT: normal Cardio: RRR and no murmur Resp: CTA B/L and unlabored GI: BS positive and non distended Extremity:  Pulses positive and No Edema Skin:   Intact Neuro: a little slow this am, Cranial Nerve Abnormalities Left central 7, Abnormal Motor 3-/5 LUE, 3-/5 LLE, 5/5 on  Right side, Abnormal FMC Ataxic/ dec FMC and Dysarthric. Severe dymetria left FNF and Left Heel to Shin Musc/Skel:  Normal GEN: NAD   Assessment/Plan: 1. Functional deficits secondary to Right pontine and Left medullary infarcts which require 3+ hours per day of interdisciplinary therapy in a comprehensive inpatient rehab setting. Physiatrist is providing close team supervision and 24 hour management of active medical problems listed below. Physiatrist and rehab team continue to assess barriers to discharge/monitor patient progress toward functional and medical goals. FIM: FIM - Bathing Bathing Steps Patient Completed: Chest;Right Arm;Left Arm;Abdomen;Front perineal area;Buttocks;Right upper leg;Left upper leg;Right lower leg (including foot);Left lower leg (including foot) Bathing: 4: Steadying assist  FIM - Upper Body Dressing/Undressing Upper body dressing/undressing steps patient completed: Thread/unthread left sleeve of front closure shirt/dress;Thread/unthread right sleeve of front closure shirt/dress Upper body dressing/undressing: 3: Mod-Patient completed 50-74% of tasks FIM - Lower Body Dressing/Undressing Lower body dressing/undressing steps patient completed: Thread/unthread right underwear leg;Thread/unthread left underwear leg;Pull underwear up/down;Thread/unthread right pants leg;Thread/unthread left pants leg;Pull pants up/down;Fasten/unfasten pants;Don/Doff right sock;Don/Doff left sock Lower body dressing/undressing: 4:  Steadying Assist  FIM - Toileting Toileting steps completed by patient: Adjust clothing prior to toileting;Performs perineal hygiene Toileting Assistive Devices: Grab bar or rail for support Toileting: 6: Assistive device: No helper  FIM - Diplomatic Services operational officer Devices:  Grab bars Toilet Transfers: 0-Activity did not occur  FIM - Banker Devices: Arm rests Bed/Chair Transfer: 3: Chair or W/C > Bed: Mod A (lift or lower assist)  FIM - Locomotion: Wheelchair Distance: 100 Locomotion: Wheelchair: 2: Travels 50 - 149 ft with moderate assistance (Pt: 50 - 74%) FIM - Locomotion: Ambulation Locomotion: Ambulation Assistive Devices: Designer, industrial/product Ambulation/Gait Assistance: 3: Mod assist Locomotion: Ambulation: 2: Travels 50 - 149 ft with moderate assistance (Pt: 50 - 74%)  Comprehension Comprehension Mode: Auditory Comprehension: 5-Follows basic conversation/direction: With no assist  Expression Expression Mode: Verbal Expression: 5-Expresses basic needs/ideas: With no assist  Social Interaction Social Interaction: 4-Interacts appropriately 75 - 89% of the time - Needs redirection for appropriate language or to initiate interaction.  Problem Solving Problem Solving: 3-Solves basic 50 - 74% of the time/requires cueing 25 - 49% of the time  Memory Memory: 4-Recognizes or recalls 75 - 89% of the time/requires cueing 10 - 24% of the time  Medical Problem List and Plan:  1. Thrombotic left medullary, right paracentral pontine infarct  2. DVT Prophylaxis/Anticoagulation: Subcutaneous heparin. Monitor platelet counts and any signs of bleeding  3. Pain Management: Tylenol as needed for headache--this has been effective 4. Mood: Zoloft 50 mg daily, Ativan 0.5 mg every 4 as needed anxiety  5. Neuropsych: This patient is capable of making decisions on his own behalf.  6. Dysphagia. Patient currently n.p.o. with nasogastric  tube feeds.   7. Hypertension. Norvasc 5 mg daily.  Borderline controlled at present  8. Hyperlipidemia. Lipitor  9. Diabetes mellitus with peripheral neuropathy. controlledHemoglobin A1c 7.4. Continue sliding scale insulin. Patient on Glucophage 500 mg twice a day prior to admission. May resume soon, but for the most part sugars have been consistently in the 130-160 range. --no changes at present    LOS (Days) 4 A FACE TO FACE EVALUATION WAS PERFORMED  Mary-Anne Polizzi E 08/09/2013, 8:05 AM

## 2013-08-09 NOTE — Progress Notes (Signed)
Physical Therapy Session Note  Patient Details  Name: Keith [name] MRN: [id number] Date of Birth: 1946-07-25  Today's Date: 08/09/2013 Time: 7829-5621 Time Calculation (min): 41 min  Short Term Goals: Week 1:  PT Short Term Goal 1 (Week 1): Pt will perform all aspects of bed mobility with HOB flat and without handrails at min assist consistently and safely PT Short Term Goal 2 (Week 1): Pt will perform stand pivot transfers with min assist to different level surfaces.  PT Short Term Goal 3 (Week 1): Pt will perform dynamic standing balance at min assist 5 mins at a time to increase balance and activity tolerance.  PT Short Term Goal 4 (Week 1): Pt will ambulate x 100' w/ LRAD at min assist  PT Short Term Goal 5 (Week 1): Pt will ascend/descend 3 stairs with B handrails at min assist  Skilled Therapeutic Interventions/Progress Updates:   Pt received sitting in w/c in room and agreeable to therapy this afternoon.  Ambulated >150' to/from gym at min/mod assist (mostly min assist, however continues to demonstrate intermittent LOB to the L and requires mod assist to correct).  Provided manual assist to steady and to increase weight shift to the R during ambulation.  Provided cues for increased step length and width bilaterally during gait.  Once in gym, performed standing balance for NMR through LLE.  See below.  Also performed tall kneeling activity to increase weight shifting to the L, increased WB through LLE, and fine motor skills.  See full details below.  Once back in room, left pt in w/c and performed 5 reps of sit <> stand without UE support.  Requires mod assist for full forward weight shift with max cues for increased quad activation.  Left pt in w/c in room with quick release belt in place and call bell in reach.    Therapy Documentation Precautions:  Precautions Precautions: Fall Precaution Comments: NPO, NG feeding tube in place, ataxia Restrictions Weight Bearing Restrictions: No    Vital Signs: Therapy Vitals Temp: 97.9 F (36.6 C) Temp src: Oral Pulse Rate: 61 Resp: 18 BP: 138/67 mmHg Patient Position, if appropriate: Sitting Oxygen Therapy SpO2: 97 % Pain: Pt with mild c/o pain with passive ROM to L shoulder with flex and abd.    Locomotion : Ambulation Ambulation/Gait Assistance: 3: Mod assist (mostly min, but mod during LOB) Wheelchair Mobility Distance: 150    Other Treatments: Treatments Therapeutic Activity: Performed standing balance for increased NMR through LLE.  Performed semi tandem with LLE placed posteriorly to R for increased balance challenge while reaching with L hand for ring to perform ring toss with forward/backwards weight shifting to increase WB through LLE and increase balance.  Did note some difficulty with fully letting go with LUE.   Neuromuscular Facilitation: Left;Lower Extremity;Upper Extremity;Activity to increase coordination;Activity to increase motor control;Activity to increase timing and sequencing;Activity to increase sustained activation;Activity to increase lateral weight shifting;Forced use Weight Bearing Technique Weight Bearing Technique: Yes RUE Weight Bearing Technique: High kneeling LUE Weight Bearing Technique: High kneeling Response to Weight Bearing Technique: Performed tall kneeling in mat with small table placed in front for RUE support while performing reacing activities with LUE to facilitate increased weight shift and weight bearing through LLE and to increased coordination and fine motor skills of LUE.  Pt continues to demonstrate ataxic movements in LUE and trunk during activity, but was able to grasp small peg each time.  Also note that he c/o shoulder issues (states present PTA) in  which it causes pain to perform shoulder flex past approx 80 deg and shoulder abd to approx 75 deg.    See FIM for current functional status  Therapy/Group: Individual Therapy  Vista Deck 08/09/2013, 4:01 PM

## 2013-08-09 NOTE — Progress Notes (Signed)
Physical Therapy Session Note  Patient Details  Name: Keith [name] MRN: [id number] Date of Birth: [birth year]  Today's Date: 08/09/2013 Time: 4098-1191 Time Calculation (min): 42 min  Short Term Goals: Week 1:  PT Short Term Goal 1 (Week 1): Pt will perform all aspects of bed mobility with HOB flat and without handrails at min assist consistently and safely PT Short Term Goal 2 (Week 1): Pt will perform stand pivot transfers with min assist to different level surfaces.  PT Short Term Goal 3 (Week 1): Pt will perform dynamic standing balance at min assist 5 mins at a time to increase balance and activity tolerance.  PT Short Term Goal 4 (Week 1): Pt will ambulate x 100' w/ LRAD at min assist  PT Short Term Goal 5 (Week 1): Pt will ascend/descend 3 stairs with B handrails at min assist  Skilled Therapeutic Interventions/Progress Updates:   Pt received sitting in w/c in room and agreeable to therapy this morning.  Pt self propelled using BLEs and BUEs at supervision level >150' to gym this am.  Requires intermittent cuing for increased use of BLEs to increase hamstring strength.   Pt also states he feels that theraband is limiting use of left hand.  Discussed that theraband is there to help due to his decreased grip strength.  Once in gym performed gait training with use of RW x 150' (with seated rest break) at min/mod assist.  Provided manual assist for adequate weight shifting with mod cues for increased step length, esp on LLE and slower gait speed for increased control due to ataxia.  Note some improvement since last week with ambulation.  Once back in gym, performed standing balance exercise to work on weight shifting, SLS, and coordination.  While standing, had pt perform tapping to targets called out by therapist.  Started with single tap, progressing to two taps and ended with three taps.  Requires mod to max assist at times to prevent overt LOB.  Transitioned to performing tapping to two cones  (alternating) with cues for slower taps to facilitate increased quad and glute activation bilaterally, as well as working on timed coordination.  Note pt tends to move feet closer together during activity and requires cues and assist to regain balance prior to tapping again.  Ended session with OTAGO HEP (LAQs BLE, standing hip abd BLE, standing knee flex BLE, mini squats, and heel raises BLE, all x 10 reps each).  Educated to perform these at sturdy surface (countertop or kitchen table) with BUE support initially with chair placed behind him for safety.  Pt verbalized understanding.  Assisted pt back to room and left in w/c with quick release belt donned and all needs in reach.   Therapy Documentation Precautions:  Precautions Precautions: Fall Precaution Comments: NPO, NG feeding tube in place, ataxia Restrictions Weight Bearing Restrictions: No   Vital Signs: Therapy Vitals BP: 128/70 mmHg Pain: Pain Assessment Pain Assessment: No/denies pain   Locomotion : Ambulation Ambulation/Gait Assistance: 3: Mod assist Wheelchair Mobility Distance: 150   See FIM for current functional status  Therapy/Group: Individual Therapy  Vista Deck 08/09/2013, 1:08 PM

## 2013-08-10 ENCOUNTER — Encounter (HOSPITAL_COMMUNITY): Payer: Medicare Other | Admitting: Occupational Therapy

## 2013-08-10 ENCOUNTER — Inpatient Hospital Stay (HOSPITAL_COMMUNITY): Payer: Medicare Other

## 2013-08-10 ENCOUNTER — Inpatient Hospital Stay (HOSPITAL_COMMUNITY): Payer: Medicare Other | Admitting: Occupational Therapy

## 2013-08-10 ENCOUNTER — Inpatient Hospital Stay (HOSPITAL_COMMUNITY): Payer: Medicare Other | Admitting: Rehabilitation

## 2013-08-10 ENCOUNTER — Inpatient Hospital Stay (HOSPITAL_COMMUNITY): Payer: Medicare Other | Admitting: Speech Pathology

## 2013-08-10 DIAGNOSIS — I6330 Cerebral infarction due to thrombosis of unspecified cerebral artery: Secondary | ICD-10-CM

## 2013-08-10 DIAGNOSIS — I10 Essential (primary) hypertension: Secondary | ICD-10-CM

## 2013-08-10 DIAGNOSIS — I69991 Dysphagia following unspecified cerebrovascular disease: Secondary | ICD-10-CM

## 2013-08-10 DIAGNOSIS — F1010 Alcohol abuse, uncomplicated: Secondary | ICD-10-CM

## 2013-08-10 DIAGNOSIS — I69993 Ataxia following unspecified cerebrovascular disease: Secondary | ICD-10-CM

## 2013-08-10 LAB — GLUCOSE, CAPILLARY
Glucose-Capillary: 153 mg/dL — ABNORMAL HIGH (ref 70–99)
Glucose-Capillary: 154 mg/dL — ABNORMAL HIGH (ref 70–99)
Glucose-Capillary: 165 mg/dL — ABNORMAL HIGH (ref 70–99)
Glucose-Capillary: 174 mg/dL — ABNORMAL HIGH (ref 70–99)

## 2013-08-10 NOTE — Progress Notes (Signed)
Occupational Therapy Session Note  Patient Details  Name: Gerson Fauth MRN: [id number] Date of Birth: [birth year]  Today's Date: 08/10/2013 Time: 7829-5621 Time Calculation (min): 45 min  Skilled Therapeutic Interventions/Progress Updates:    Pt ambulated to the gym with min assist using the RW.  In the gym worked in quadriped on LUE weightbearing to help increase strength and decrease ataxia.  Had pt use the RUE to reach for clothespins in this position while having to use the LUE to maintain his balance.  Pt needed min facilitation and mod cueing to maintain left elbow extension.  Transitioned to sitting after a few minutes as pt began to report some right shoulder pain.  Progressed to having pt place clothespins on the vertical bar using the LUE to increase coordination.  He needed min assist to reach above 90 degrees shoulder flexion and maintain neutral trunk alignment.  Pt able to place red and yellow clothespins without assistance.  He was not able to perform the resistive green clothespins however.  Had pt remove the clothespins from the vertical bar and replace, again using the LUE.  Finished by having pt work on flipping cards one at a time.  Pt with slight decreased ataxia when performing this task.    Therapy Documentation Precautions:  Precautions Precautions: Fall Precaution Comments: NPO, NG feeding tube in place, ataxia Restrictions Weight Bearing Restrictions: No  Pain: Pain Assessment Pain Assessment: No/denies pain  See FIM for current functional status  Therapy/Group: Individual Therapy  Panayiota Larkin OTR/L 08/10/2013, 3:53 PM

## 2013-08-10 NOTE — Procedures (Signed)
Objective Swallowing Evaluation: Modified Barium Swallowing Study  Patient Details  Name: Keith [name] MRN: [id number] Date of Birth: [birth year]  Today's Date: 08/10/2013 Time:0930 - 1000  30 Minutes  Past Medical History:  Past Medical History  Diagnosis Date  . Diabetes mellitus without complication   . Hypertension    Past Surgical History:  Past Surgical History  Procedure Laterality Date  . Prostate surgery    . Knee surgery     HPI:  67 y.o. 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  MBS ordered due to concerns for dysphagia.  Pt admits to h/o "sore throat" and transient problems swallowing in the last few weeks.  Pt is a previous smoker, CXR negative 11/29.  Pt admitted to [facility] 08/16/13 and has been participating in therapies. Repeat objective study ordered today to determine readiness for p.o.       Recommendation/Prognosis  Clinical Impression Patient with functional improvement as compared to previous objective assessment from 11/30.  However, he continues to demonstrate a moderate oral and severe pharyngeal-cervical esophageal phase dysphagia with minimal improvements.  Patient's dysphagia continues to be characterized by delayed oral transit/coordination resulting in premature spillage of boluses into pharynx as well as delayed swallow initiation.  This often resulted in penetration prior to swallow initiation as well as after due to copious amounts of residuals that remained in pharynx post swallow.  SLP provided cues for 3-4 multiple quick swallows with intermittent throat clears, which were effective at protecting airway with puree textures only.  Patient is a high aspiration risk currently due to level of dysphagia and p.o. trials are only recommended with SLP at this time.  MD to discuss options: long-term versus short-term alternative  means with potential for progress in another week's time.    Dysphagia Diagnosis: Severe pharyngeal phase dysphagia;Moderate oral phase dysphagia;Moderate cervical esophageal phase dysphagia Swallow Evaluation Recommendations Diet Recommendations: NPO;Alternative means - long-term;Alternative means - temporary (short term versus long term) Medication Administration: Via alternative means Oral Care Recommendations: Oral care Q4 per protocol Follow up Recommendations: Outpatient SLP;24 hour supervision/assistance Prognosis Prognosis for Safe Diet Advancement: Fair Barriers to Reach Goals: Severity of dysphagia Individuals Consulted Consulted and Agree with Results and Recommendations: Patient;RN  SLP Assessment/Plan Refer to care plan   Short Term Goals: Week 1: SLP Short Term Goal 1 (Week 1): Pt will perform pharyngeal strengthening exercises with Mod cues SLP Short Term Goal 2 (Week 1): Pt will initiate a volitional pharyngeal swallow x10 with Min cues SLP Short Term Goal 3 (Week 1): Pt will utilize speech intelligibility strategies with Min cues SLP Short Term Goal 4 (Week 1): Pt will perform oral motor exercises with Min cues  General:  Date of Onset: 08/01/13 HPI: 67 y.o. 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  MBS ordered due to concerns for dysphagia.  Pt admits to h/o "sore throat" and transient problems swallowing in the last few weeks.  Pt is a previous smoker, CXR negative 11/29.  Pt admitted to [facility] 08/16/13 and has been participating in therapies. Repeat objective study ordered today to determine readiness for p.o.   Type of Study: Modified Barium Swallowing Study Reason for Referral: Objectively evaluate swallowing function Previous Swallow Assessment: MBS 11/30  with recommendations for NPO Diet Prior to this Study:  NPO;Panda Temperature Spikes Noted: No Respiratory Status: Room air History of Recent Intubation: No Behavior/Cognition: Alert;Cooperative;Pleasant mood;Hard of hearing Oral Cavity - Dentition: Adequate natural dentition Oral Motor / Sensory Function: Impaired - see Bedside swallow eval Self-Feeding Abilities: Able to feed self Patient Positioning: Upright in chair Baseline Vocal Quality: Low vocal intensity;Hoarse Volitional Cough: Weak Volitional Swallow: Able to elicit Anatomy:  (what appeared to be osteophytes c4-c6 no MD to confirm) Pharyngeal Secretions: Not observed secondary MBS  Reason for Referral:  Objectively evaluate swallowing function   Oral Phase Oral Preparation/Oral Phase Oral Phase: Impaired Oral - Honey Oral - Honey Teaspoon: Weak lingual manipulation;Delayed oral transit;Reduced posterior propulsion Oral - Nectar Oral - Nectar Teaspoon: Not tested Oral - Thin Oral - Thin Teaspoon: Not tested Oral - Solids Oral - Puree: Weak lingual manipulation;Reduced posterior propulsion;Delayed oral transit Oral - Regular: Not tested Pharyngeal Phase  Pharyngeal Phase Pharyngeal Phase: Impaired Pharyngeal - Honey Pharyngeal - Honey Teaspoon: Pharyngeal residue - valleculae;Pharyngeal residue - pyriform sinuses;Reduced tongue base retraction;Penetration/Aspiration before swallow;Penetration/Aspiration after swallow;Reduced laryngeal elevation;Reduced anterior laryngeal mobility;Reduced epiglottic inversion;Reduced pharyngeal peristalsis;Reduced airway/laryngeal closure;Premature spillage to valleculae;Delayed swallow initiation;Premature spillage to pyriform sinuses;Trace aspiration Penetration/Aspiration details (honey teaspoon): Material enters airway, CONTACTS cords and not ejected out;Material enters airway, remains ABOVE vocal cords and not ejected out (cued coughs not effective, material was eventually aspirated) Pharyngeal - Nectar Pharyngeal - Nectar Teaspoon: Not  tested Pharyngeal - Thin Pharyngeal - Thin Teaspoon: Not tested Pharyngeal - Solids Pharyngeal - Puree: Premature spillage to valleculae;Delayed swallow initiation;Reduced pharyngeal peristalsis;Reduced epiglottic inversion;Reduced anterior laryngeal mobility;Reduced laryngeal elevation;Reduced airway/laryngeal closure;Reduced tongue base retraction;Penetration/Aspiration before swallow;Pharyngeal residue - valleculae;Pharyngeal residue - pyriform sinuses;Penetration/Aspiration after swallow;Pharyngeal residue - posterior pharnyx Penetration/Aspiration details (puree): Material does not enter airway;Material enters airway, remains ABOVE vocal cords then ejected out;Material enters airway, remains ABOVE vocal cords and not ejected out (cues throat clears/coughs were effective at reducing penetrates) Pharyngeal - Regular: Not tested Cervical Esophageal Phase  Cervical Esophageal Phase Cervical Esophageal Phase: Impaired Cervical Esophageal Phase - Comment Cervical Esophageal Comment: continued decreased UES opening due to poor laryngeal elevation which is reduced with cues for multiple hard swallows.   G-Codes   Charlane Ferretti., CCC-SLP [phone]  Keith [name] 08/10/2013, 10:33 AM

## 2013-08-10 NOTE — Progress Notes (Signed)
Occupational Therapy Session Note  Patient Details  Name: Ajamu Maxon MRN: [id number] Date of Birth: 1946/07/29  Today's Date: 08/10/2013 Time: 1103-1200 Time Calculation (min): 57 min  Short Term Goals: Week 1:  OT Short Term Goal 1 (Week 1): Pt will perform bathing sit to stand with close supervison at the sink. OT Short Term Goal 2 (Week 1): Pt will perform toilet transfer with min assist to 3:1. OT Short Term Goal 3 (Week 1): Pt will perform UB and LB dressing with supervision. OT Short Term Goal 4 (Week 1): Pt will use the LUE at a diminished level for selfcare tasks with supervision OT Short Term Goal 5 (Week 1): Pt will peform gross motor and FM coordination exercises with the LUE with supervison. Week 3:     Skilled Therapeutic Interventions/Progress Updates:    Pt performed bathing and dressing sit to stand at the sink this session.  He also performed toileting in standing using the grab bar for support and min assist for therapist.  Pt overall min guard assist to perform sit to stand and standing during LB selfcare tasks.  He still demonstrates decreased FM abilities to manipulate buttons with the LUE but was able to complete one large button on his pajama top.  Pt still demonstrates ataxic movement in the LUE and trunk with functional use as well.    Therapy Documentation Precautions:  Precautions Precautions: Fall Precaution Comments: NPO, NG feeding tube in place, ataxia Restrictions Weight Bearing Restrictions: No  Pain: Pain Assessment Pain Assessment: No/denies pain ADL: See FIM for current functional status  Therapy/Group: Individual Therapy  Kynnadi Dicenso OTR/L 08/10/2013, 12:42 PM

## 2013-08-10 NOTE — Progress Notes (Signed)
67 y.o. male with history of DM, HTN, who was admitted on 07/31/13 with left facial droop with dysarthria and one week history of imbalance as diplopia X 2 days. MRI/MRA brain done revealing two small acute brainstem infarcts in-right paracentral pons, and central to left paracentral medulla possibly involving left pyramid, no stenosis. 2D echo with EF 55-60% and grade 1 diastolic dysfunction. Carotid dopplers with 1-39% ICA stenosis. Neurology services consulted maintained on aspirin and Plavix therapy for CVA prophylaxis. Subcutaneous heparin added for DVT prophylaxis.Marland Kitchen MBS done 08/01/13 alternative nutritional means and NPO recommended    Subjective/Complaints: No SOB, no CP Pt thinks he is having MBS today Occ cough  Review of Systems - otherwise negative   Objective: Vital Signs: Blood pressure 121/58, pulse 58, temperature 98.2 F (36.8 C), temperature source Oral, resp. rate 18, weight 78.8 kg (173 lb 11.6 oz), SpO2 96.00%. No results found. Results for orders placed during the hospital encounter of 08/05/13 (from the past 72 hour(s))  GLUCOSE, CAPILLARY     Status: Abnormal   Collection Time    08/07/13 11:59 AM      Result Value Range   Glucose-Capillary 172 (*) 70 - 99 mg/dL  GLUCOSE, CAPILLARY     Status: Abnormal   Collection Time    08/07/13  4:45 PM      Result Value Range   Glucose-Capillary 178 (*) 70 - 99 mg/dL  GLUCOSE, CAPILLARY     Status: Abnormal   Collection Time    08/07/13  8:07 PM      Result Value Range   Glucose-Capillary 146 (*) 70 - 99 mg/dL   Comment 1 Notify RN    GLUCOSE, CAPILLARY     Status: Abnormal   Collection Time    08/08/13 12:07 AM      Result Value Range   Glucose-Capillary 171 (*) 70 - 99 mg/dL   Comment 1 Notify RN    GLUCOSE, CAPILLARY     Status: Abnormal   Collection Time    08/08/13  4:07 AM      Result Value Range   Glucose-Capillary 155 (*) 70 - 99 mg/dL  GLUCOSE, CAPILLARY     Status: Abnormal   Collection Time   08/08/13  7:35 AM      Result Value Range   Glucose-Capillary 173 (*) 70 - 99 mg/dL   Comment 1 Notify RN    GLUCOSE, CAPILLARY     Status: Abnormal   Collection Time    08/08/13 11:34 AM      Result Value Range   Glucose-Capillary 172 (*) 70 - 99 mg/dL   Comment 1 Notify RN    GLUCOSE, CAPILLARY     Status: Abnormal   Collection Time    08/08/13  4:28 PM      Result Value Range   Glucose-Capillary 147 (*) 70 - 99 mg/dL   Comment 1 Notify RN    GLUCOSE, CAPILLARY     Status: Abnormal   Collection Time    08/08/13  7:54 PM      Result Value Range   Glucose-Capillary 146 (*) 70 - 99 mg/dL   Comment 1 Notify RN    GLUCOSE, CAPILLARY     Status: Abnormal   Collection Time    08/08/13 11:48 PM      Result Value Range   Glucose-Capillary 145 (*) 70 - 99 mg/dL   Comment 1 Notify RN    GLUCOSE, CAPILLARY     Status: Abnormal  Collection Time    08/09/13  4:06 AM      Result Value Range   Glucose-Capillary 173 (*) 70 - 99 mg/dL   Comment 1 Notify RN    GLUCOSE, CAPILLARY     Status: Abnormal   Collection Time    08/09/13  7:41 AM      Result Value Range   Glucose-Capillary 154 (*) 70 - 99 mg/dL   Comment 1 Notify RN    GLUCOSE, CAPILLARY     Status: Abnormal   Collection Time    08/09/13 11:45 AM      Result Value Range   Glucose-Capillary 146 (*) 70 - 99 mg/dL   Comment 1 Notify RN    GLUCOSE, CAPILLARY     Status: Abnormal   Collection Time    08/09/13  4:35 PM      Result Value Range   Glucose-Capillary 169 (*) 70 - 99 mg/dL  GLUCOSE, CAPILLARY     Status: Abnormal   Collection Time    08/09/13  7:56 PM      Result Value Range   Glucose-Capillary 157 (*) 70 - 99 mg/dL   Comment 1 Notify RN    GLUCOSE, CAPILLARY     Status: Abnormal   Collection Time    08/10/13 12:05 AM      Result Value Range   Glucose-Capillary 154 (*) 70 - 99 mg/dL   Comment 1 Notify RN    GLUCOSE, CAPILLARY     Status: Abnormal   Collection Time    08/10/13  3:56 AM      Result Value  Range   Glucose-Capillary 165 (*) 70 - 99 mg/dL   Comment 1 Notify RN    GLUCOSE, CAPILLARY     Status: Abnormal   Collection Time    08/10/13  7:31 AM      Result Value Range   Glucose-Capillary 187 (*) 70 - 99 mg/dL   Comment 1 Notify RN       HEENT: normal Cardio: RRR and no murmur Resp: CTA B/L and unlabored GI: BS positive and non distended Extremity:  Pulses positive and No Edema Skin:   Intact Neuro: a little slow this am, Cranial Nerve Abnormalities Left central 7, Abnormal Motor 3-/5 LUE, 3-/5 LLE, 5/5 on  Right side, Abnormal FMC Ataxic/ dec FMC and Dysarthric. Severe dymetria left FNF and Left Heel to Shin Musc/Skel:  Normal GEN: NAD   Assessment/Plan: 1. Functional deficits secondary to Right pontine and Left medullary infarcts which require 3+ hours per day of interdisciplinary therapy in a comprehensive inpatient rehab setting. Physiatrist is providing close team supervision and 24 hour management of active medical problems listed below. Physiatrist and rehab team continue to assess barriers to discharge/monitor patient progress toward functional and medical goals. FIM: FIM - Bathing Bathing Steps Patient Completed: Chest;Right Arm;Left Arm;Abdomen;Front perineal area;Buttocks;Right upper leg;Left upper leg;Right lower leg (including foot);Left lower leg (including foot) Bathing: 4: Steadying assist  FIM - Upper Body Dressing/Undressing Upper body dressing/undressing steps patient completed: Thread/unthread left sleeve of front closure shirt/dress;Thread/unthread right sleeve of front closure shirt/dress;Pull shirt around back of front closure shirt/dress Upper body dressing/undressing: 4: Min-Patient completed 75 plus % of tasks FIM - Lower Body Dressing/Undressing Lower body dressing/undressing steps patient completed: Thread/unthread right underwear leg;Thread/unthread left underwear leg;Pull underwear up/down;Thread/unthread right pants leg;Thread/unthread left  pants leg;Pull pants up/down;Fasten/unfasten pants;Don/Doff right sock;Don/Doff left sock Lower body dressing/undressing: 4: Steadying Assist  FIM - Toileting Toileting steps completed by patient:  Adjust clothing prior to toileting;Performs perineal hygiene Toileting Assistive Devices: Grab bar or rail for support Toileting: 6: Assistive device: No helper  FIM - Diplomatic Services operational officer Devices: Grab bars Toilet Transfers: 4-To toilet/BSC: Min A (steadying Pt. > 75%);4-From toilet/BSC: Min A (steadying Pt. > 75%)  FIM - Bed/Chair Transfer Bed/Chair Transfer Assistive Devices: Arm rests Bed/Chair Transfer: 4: Supine > Sit: Min A (steadying Pt. > 75%/lift 1 leg);4: Sit > Supine: Min A (steadying pt. > 75%/lift 1 leg);4: Bed > Chair or W/C: Min A (steadying Pt. > 75%);4: Chair or W/C > Bed: Min A (steadying Pt. > 75%)  FIM - Locomotion: Wheelchair Distance: 150 Locomotion: Wheelchair: 5: Travels 150 ft or more: maneuvers on rugs and over door sills with supervision, cueing or coaxing FIM - Locomotion: Ambulation Locomotion: Ambulation Assistive Devices: Designer, industrial/product Ambulation/Gait Assistance: 3: Mod assist (mostly min, but mod during LOB) Locomotion: Ambulation: 3: Travels 150 ft or more with moderate assistance (Pt: 50 - 74%)  Comprehension Comprehension Mode: Auditory Comprehension: 5-Follows basic conversation/direction: With no assist  Expression Expression Mode: Verbal Expression: 5-Expresses basic 90% of the time/requires cueing < 10% of the time.  Social Interaction Social Interaction: 5-Interacts appropriately 90% of the time - Needs monitoring or encouragement for participation or interaction.  Problem Solving Problem Solving: 4-Solves basic 75 - 89% of the time/requires cueing 10 - 24% of the time  Memory Memory: 5-Recognizes or recalls 90% of the time/requires cueing < 10% of the time  Medical Problem List and Plan:  1. Thrombotic left  medullary, right paracentral pontine infarct  2. DVT Prophylaxis/Anticoagulation: Subcutaneous heparin. Monitor platelet counts and any signs of bleeding  3. Pain Management: Tylenol as needed for headache--this has been effective 4. Mood: Zoloft 50 mg daily, Ativan 0.5 mg every 4 as needed anxiety  5. Neuropsych: This patient is capable of making decisions on his own behalf.  6. Dysphagia. Patient currently n.p.o. with nasogastric tube feeds.   7. Hypertension. Norvasc 5 mg daily.  Borderline controlled at present  8. Hyperlipidemia. Lipitor  9. Diabetes mellitus with peripheral neuropathy. controlledHemoglobin A1c 7.4. Continue sliding scale insulin. Patient on Glucophage 500 mg twice a day prior to admission. May resume soon, but for the most part sugars have been consistently in the 130-160 range. --no changes at present, expect improvement once off TF    LOS (Days) 5 A FACE TO FACE EVALUATION WAS PERFORMED  KIRSTEINS,ANDREW E 08/10/2013, 8:46 AM

## 2013-08-10 NOTE — Progress Notes (Signed)
Physical Therapy Session Note  Patient Details  Name: Keith [name] MRN: [id number] Date of Birth: [birth year]  Today's Date: 08/10/2013 Time: 4098-1191 Time Calculation (min): 54 min  Short Term Goals: Week 1:  PT Short Term Goal 1 (Week 1): Pt will perform all aspects of bed mobility with HOB flat and without handrails at min assist consistently and safely PT Short Term Goal 2 (Week 1): Pt will perform stand pivot transfers with min assist to different level surfaces.  PT Short Term Goal 3 (Week 1): Pt will perform dynamic standing balance at min assist 5 mins at a time to increase balance and activity tolerance.  PT Short Term Goal 4 (Week 1): Pt will ambulate x 100' w/ LRAD at min assist  PT Short Term Goal 5 (Week 1): Pt will ascend/descend 3 stairs with B handrails at min assist  Skilled Therapeutic Interventions/Progress Updates:   Pt received sitting in w/c in room and agreeable to therapy this afternoon.  Ambulated >150' to/from gym with RW at min assist.  During session, performed gait with RW to improve gait quality and without RW to challenge balance and increase weight shifting and weight bearing through LLE.  See details below.  Also focused on high level balance activities to work on ankle and hip strategies.  See details below under therapeutic activity.  Progressed to performing ambulation over obstacles and turning to address balance, weight shifting, and modified SLS.  Pt continues to have multiple LOB posteriorly and has increased difficulty maintaining equal weight on whole foot during standing activities.  Also performed stair negotiation during session to simulate home entry to prepare for DC and also to increase BLE strength and work on coordination with controlled movements.  Pt ambulated back to room and left in w/c with quick release belt donned and all needs in reach.   Therapy Documentation Precautions:  Precautions Precautions: Fall Precaution Comments: NPO, NG feeding  tube in place, ataxia Restrictions Weight Bearing Restrictions: No   Vital Signs: Therapy Vitals Temp: 97.8 F (36.6 C) Temp src: Oral Pulse Rate: 65 Resp: 18 BP: 133/65 mmHg Patient Position, if appropriate: Sitting Oxygen Therapy SpO2: 97 % O2 Device: None (Room air) Pain: Pt with some pain in B knees, states he had received pain meds prior to session.  Locomotion : Ambulation Ambulation: Yes Ambulation/Gait Assistance: 4: Min assist;3: Mod assist Ambulation Distance (Feet): 160 Feet (x2 reps, then another 80' x 2 reps) Assistive device: Rolling walker (with and without AD) Ambulation/Gait Assistance Details: Verbal cues for sequencing;Verbal cues for technique;Verbal cues for precautions/safety;Verbal cues for gait pattern;Verbal cues for safe use of DME/AE;Manual facilitation for weight shifting Ambulation/Gait Assistance Details: Continue to gait train with use of RW during session with ambulation to/from gym at min assist level today with min tactile cues for increased weight shifting L and also cues for upright posture, safety with RW (esp when turning), and also to perform more heel to toe contact during stance phase, as he continues to keep toes in air during standing and ambulation.  Gait Gait: Yes Gait Pattern: Impaired Gait Pattern: Step-through pattern;Trunk flexed;Narrow base of support;Ataxic;Decreased stride length Stairs / Additional Locomotion Stairs: Yes Stairs Assistance: 4: Min assist Stairs Assistance Details: Verbal cues for sequencing;Verbal cues for technique;Verbal cues for precautions/safety;Verbal cues for gait pattern Stairs Assistance Details (indicate cue type and reason): Pt noted to be less ataxic on stairs today than on day of evaluation.  Continue to provide min assist for steadying and adequate weight shifting  L and mod cues for step technique for increased safety.  Stair Management Technique: Two rails;Step to pattern;Forwards Number of Stairs:  10 Height of Stairs: 4 (and 6")    Other Treatments: Treatments Therapeutic Activity: Performed high level balance actvities during session involving standing on foam pad in // bars with BUE support to no UE support with feet apart x 3 min, feet together x 2 mins with external perturbations forwards/backwards and side to side.  Pt requires min to mod assist to maintain balance throughout this activity.  Also performed standing activity using rebounder with feet in semi tandem and then feet together.  Noted only one overt LOB with mod assist to correct.    See FIM for current functional status  Therapy/Group: Individual Therapy  Vista Deck 08/10/2013, 3:22 PM

## 2013-08-11 ENCOUNTER — Inpatient Hospital Stay (HOSPITAL_COMMUNITY): Payer: Medicare Other | Admitting: Occupational Therapy

## 2013-08-11 ENCOUNTER — Inpatient Hospital Stay (HOSPITAL_COMMUNITY): Payer: Medicare Other | Admitting: Rehabilitation

## 2013-08-11 ENCOUNTER — Inpatient Hospital Stay (HOSPITAL_COMMUNITY): Payer: Medicare Other | Admitting: Speech Pathology

## 2013-08-11 ENCOUNTER — Encounter (HOSPITAL_COMMUNITY): Payer: Medicare Other | Admitting: Occupational Therapy

## 2013-08-11 LAB — GLUCOSE, CAPILLARY
Glucose-Capillary: 179 mg/dL — ABNORMAL HIGH (ref 70–99)
Glucose-Capillary: 158 mg/dL — ABNORMAL HIGH (ref 70–99)
Glucose-Capillary: 148 mg/dL — ABNORMAL HIGH (ref 70–99)

## 2013-08-11 MED ORDER — METFORMIN HCL 500 MG PO TABS
500.0000 mg | ORAL_TABLET | Freq: Every day | ORAL | Status: DC
  Administered 2013-08-12 – 2013-08-13 (×2): 500 mg via ORAL
  Filled 2013-08-11 (×4): qty 1

## 2013-08-11 NOTE — Progress Notes (Signed)
67 y.o. male with history of DM, HTN, who was admitted on 07/31/13 with left facial droop with dysarthria and one week history of imbalance as diplopia X 2 days. MRI/MRA brain done revealing two small acute brainstem infarcts in-right paracentral pons, and central to left paracentral medulla possibly involving left pyramid, no stenosis. 2D echo with EF 55-60% and grade 1 diastolic dysfunction. Carotid dopplers with 1-39% ICA stenosis. Neurology services consulted maintained on aspirin and Plavix therapy for CVA prophylaxis. Subcutaneous heparin added for DVT prophylaxis.Marland Kitchen MBS done 08/01/13 alternative nutritional means and NPO recommended    Subjective/Complaints: No SOB, no CP MBS yesterday still NPO Occ cough  Review of Systems - otherwise negative   Objective: Vital Signs: Blood pressure 124/64, pulse 64, temperature 97.8 F (36.6 C), temperature source Oral, resp. rate 19, weight 80.1 kg (176 lb 9.4 oz), SpO2 95.00%. Dg Swallowing Func-speech Pathology  08/10/2013   [name], CCC-SLP     08/10/2013  5:06 PM Objective Swallowing Evaluation: Modified Barium Swallowing Study   Patient Details  Name: Keith [name] MRN: [id number] Date of Birth: 03-22-1946  Today's Date: 08/10/2013 Time:0930 - 1000  30 Minutes  Past Medical History:  Past Medical History  Diagnosis Date  . Diabetes mellitus without complication   . Hypertension    Past Surgical History:  Past Surgical History  Procedure Laterality Date  . Prostate surgery    . Knee surgery     HPI:  67 y.o. male admitted to [facility] with left facial droop, dysarthria,  left UE and LE weakness- pt found to have 2 brain stem strokes  impacting right paracentral pons central to left paracent  medulla.  MBS ordered due to concerns for dysphagia.  Pt admits  to h/o "sore throat" and transient problems swallowing in the  last few weeks.  Pt is a previous smoker, CXR negative 11/29.  Pt  admitted to CIR 08/16/13 and has been participating in therapies.  Repeat  objective study ordered today to determine readiness for  p.o.       Recommendation/Prognosis  Clinical Impression Patient with functional improvement as compared to previous  objective assessment from 11/30.  However, he continues to  demonstrate a moderate oral and severe pharyngeal-cervical  esophageal phase dysphagia with minimal improvements.  Patient's  dysphagia continues to be characterized by delayed oral  transit/coordination resulting in premature spillage of boluses  into pharynx as well as delayed swallow initiation.  This often  resulted in penetration prior to swallow initiation as well as  after due to copious amounts of residuals that remained in  pharynx post swallow.  SLP provided cues for 3-4 multiple quick  swallows with intermittent throat clears, which were effective at  protecting airway with puree textures only.  Patient is a high  aspiration risk currently due to level of dysphagia and p.o.  trials are only recommended with SLP at this time.  MD to discuss  options: long-term versus short-term alternative means with  potential for progress in another week's time.    Dysphagia Diagnosis: Severe pharyngeal phase dysphagia;Moderate  oral phase dysphagia;Moderate cervical esophageal phase dysphagia Swallow Evaluation Recommendations Diet Recommendations: NPO;Alternative means -  long-term;Alternative means - temporary (short term versus long  term) Medication Administration: Via alternative means Oral Care Recommendations: Oral care Q4 per protocol Follow up Recommendations: Outpatient SLP;24 hour  supervision/assistance Prognosis Prognosis for Safe Diet Advancement: Fair Barriers to Reach Goals: Severity of dysphagia Individuals Consulted Consulted and Agree with Results and Recommendations: Patient;RN  SLP Assessment/Plan Refer to care plan   Short Term Goals: Week 1: SLP Short Term Goal 1 (Week 1): Pt will perform  pharyngeal strengthening exercises with Mod cues SLP Short Term Goal 2 (Week  1): Pt will initiate a volitional  pharyngeal swallow x10 with Min cues SLP Short Term Goal 3 (Week 1): Pt will utilize speech  intelligibility strategies with Min cues SLP Short Term Goal 4 (Week 1): Pt will perform oral motor  exercises with Min cues  General:  Date of Onset: 08/01/13 HPI: 67 y.o. male admitted to [facility] with left facial droop,  dysarthria, left UE and LE weakness- pt found to have 2 brain  stem strokes impacting right paracentral pons central to left  paracent medulla.  MBS ordered due to concerns for dysphagia.  Pt  admits to h/o "sore throat" and transient problems swallowing in  the last few weeks.  Pt is a previous smoker, CXR negative 11/29.   Pt admitted to CIR 08/16/13 and has been participating in  therapies. Repeat objective study ordered today to determine  readiness for p.o.   Type of Study: Modified Barium Swallowing Study Reason for Referral: Objectively evaluate swallowing function Previous Swallow Assessment: MBS 11/30 with recommendations for  NPO Diet Prior to this Study: NPO;Panda Temperature Spikes Noted: No Respiratory Status: Room air History of Recent Intubation: No Behavior/Cognition: Alert;Cooperative;Pleasant mood;Hard of  hearing Oral Cavity - Dentition: Adequate natural dentition Oral Motor / Sensory Function: Impaired - see Bedside swallow  eval Self-Feeding Abilities: Able to feed self Patient Positioning: Upright in chair Baseline Vocal Quality: Low vocal intensity;Hoarse Volitional Cough: Weak Volitional Swallow: Able to elicit Anatomy:  (what appeared to be osteophytes c4-c6 no MD to  confirm) Pharyngeal Secretions: Not observed secondary MBS  Reason for Referral:  Objectively evaluate swallowing function   Oral Phase Oral Preparation/Oral Phase Oral Phase: Impaired Oral - Honey Oral - Honey Teaspoon: Weak lingual manipulation;Delayed oral  transit;Reduced posterior propulsion Oral - Nectar Oral - Nectar Teaspoon: Not tested Oral - Thin Oral - Thin Teaspoon: Not  tested Oral - Solids Oral - Puree: Weak lingual manipulation;Reduced posterior  propulsion;Delayed oral transit Oral - Regular: Not tested Pharyngeal Phase  Pharyngeal Phase Pharyngeal Phase: Impaired Pharyngeal - Honey Pharyngeal - Honey Teaspoon: Pharyngeal residue -  valleculae;Pharyngeal residue - pyriform sinuses;Reduced tongue  base retraction;Penetration/Aspiration before  swallow;Penetration/Aspiration after swallow;Reduced laryngeal  elevation;Reduced anterior laryngeal mobility;Reduced epiglottic  inversion;Reduced pharyngeal peristalsis;Reduced airway/laryngeal  closure;Premature spillage to valleculae;Delayed swallow  initiation;Premature spillage to pyriform sinuses;Trace  aspiration Penetration/Aspiration details (honey teaspoon): Material enters  airway, CONTACTS cords and not ejected out;Material enters  airway, remains ABOVE vocal cords and not ejected out (cued  coughs not effective, material was eventually aspirated) Pharyngeal - Nectar Pharyngeal - Nectar Teaspoon: Not tested Pharyngeal - Thin Pharyngeal - Thin Teaspoon: Not tested Pharyngeal - Solids Pharyngeal - Puree: Premature spillage to valleculae;Delayed  swallow initiation;Reduced pharyngeal peristalsis;Reduced  epiglottic inversion;Reduced anterior laryngeal mobility;Reduced  laryngeal elevation;Reduced airway/laryngeal closure;Reduced  tongue base retraction;Penetration/Aspiration before  swallow;Pharyngeal residue - valleculae;Pharyngeal residue -  pyriform sinuses;Penetration/Aspiration after swallow;Pharyngeal  residue - posterior pharnyx Penetration/Aspiration details (puree): Material does not enter  airway;Material enters airway, remains ABOVE vocal cords then  ejected out;Material enters airway, remains ABOVE vocal cords and  not ejected out (cues throat clears/coughs were effective at  reducing penetrates) Pharyngeal - Regular: Not tested Cervical Esophageal Phase  Cervical Esophageal Phase Cervical Esophageal Phase: Impaired  Cervical Esophageal Phase - Comment Cervical Esophageal Comment: continued decreased UES  opening due  to poor laryngeal elevation which is reduced with cues for  multiple hard swallows.   G-Codes   Charlane Ferretti., CCC-SLP [phone]  BOWIE,MELISSA 08/10/2013, 10:33 AM     Results for orders placed during the hospital encounter of 08/05/13 (from the past 72 hour(s))  GLUCOSE, CAPILLARY     Status: Abnormal   Collection Time    08/08/13 11:34 AM      Result Value Range   Glucose-Capillary 172 (*) 70 - 99 mg/dL   Comment 1 Notify RN    GLUCOSE, CAPILLARY     Status: Abnormal   Collection Time    08/08/13  4:28 PM      Result Value Range   Glucose-Capillary 147 (*) 70 - 99 mg/dL   Comment 1 Notify RN    GLUCOSE, CAPILLARY     Status: Abnormal   Collection Time    08/08/13  7:54 PM      Result Value Range   Glucose-Capillary 146 (*) 70 - 99 mg/dL   Comment 1 Notify RN    GLUCOSE, CAPILLARY     Status: Abnormal   Collection Time    08/08/13 11:48 PM      Result Value Range   Glucose-Capillary 145 (*) 70 - 99 mg/dL   Comment 1 Notify RN    GLUCOSE, CAPILLARY     Status: Abnormal   Collection Time    08/09/13  4:06 AM      Result Value Range   Glucose-Capillary 173 (*) 70 - 99 mg/dL   Comment 1 Notify RN    GLUCOSE, CAPILLARY     Status: Abnormal   Collection Time    08/09/13  7:41 AM      Result Value Range   Glucose-Capillary 154 (*) 70 - 99 mg/dL   Comment 1 Notify RN    GLUCOSE, CAPILLARY     Status: Abnormal   Collection Time    08/09/13 11:45 AM      Result Value Range   Glucose-Capillary 146 (*) 70 - 99 mg/dL   Comment 1 Notify RN    GLUCOSE, CAPILLARY     Status: Abnormal   Collection Time    08/09/13  4:35 PM      Result Value Range   Glucose-Capillary 169 (*) 70 - 99 mg/dL  GLUCOSE, CAPILLARY     Status: Abnormal   Collection Time    08/09/13  7:56 PM      Result Value Range   Glucose-Capillary 157 (*) 70 - 99 mg/dL   Comment 1 Notify RN    GLUCOSE,  CAPILLARY     Status: Abnormal   Collection Time    08/10/13 12:05 AM      Result Value Range   Glucose-Capillary 154 (*) 70 - 99 mg/dL   Comment 1 Notify RN    GLUCOSE, CAPILLARY     Status: Abnormal   Collection Time    08/10/13  3:56 AM      Result Value Range   Glucose-Capillary 165 (*) 70 - 99 mg/dL   Comment 1 Notify RN    GLUCOSE, CAPILLARY     Status: Abnormal   Collection Time    08/10/13  7:31 AM      Result Value Range   Glucose-Capillary 187 (*) 70 - 99 mg/dL   Comment 1 Notify RN    GLUCOSE, CAPILLARY     Status: Abnormal   Collection Time    08/10/13 11:58 AM  Result Value Range   Glucose-Capillary 153 (*) 70 - 99 mg/dL   Comment 1 Notify RN    GLUCOSE, CAPILLARY     Status: Abnormal   Collection Time    08/10/13  5:04 PM      Result Value Range   Glucose-Capillary 169 (*) 70 - 99 mg/dL   Comment 1 Notify RN    GLUCOSE, CAPILLARY     Status: Abnormal   Collection Time    08/10/13  8:41 PM      Result Value Range   Glucose-Capillary 174 (*) 70 - 99 mg/dL  GLUCOSE, CAPILLARY     Status: Abnormal   Collection Time    08/10/13 11:39 PM      Result Value Range   Glucose-Capillary 175 (*) 70 - 99 mg/dL  GLUCOSE, CAPILLARY     Status: Abnormal   Collection Time    08/11/13  4:16 AM      Result Value Range   Glucose-Capillary 178 (*) 70 - 99 mg/dL  GLUCOSE, CAPILLARY     Status: Abnormal   Collection Time    08/11/13  7:24 AM      Result Value Range   Glucose-Capillary 179 (*) 70 - 99 mg/dL   Comment 1 Notify RN       HEENT: normal Cardio: RRR and no murmur Resp: CTA B/L and unlabored GI: BS positive and non distended Extremity:  Pulses positive and No Edema Skin:   Intact Neuro: a little slow this am, Cranial Nerve Abnormalities Left central 7, Abnormal Motor 3-/5 LUE, 3-/5 LLE, 5/5 on  Right side, Abnormal FMC Ataxic/ dec FMC and Dysarthric. Severe dymetria left FNF and Left Heel to Shin Musc/Skel:  Normal GEN: NAD   Assessment/Plan: 1.  Functional deficits secondary to Right pontine and Left medullary infarcts which require 3+ hours per day of interdisciplinary therapy in a comprehensive inpatient rehab setting. Physiatrist is providing close team supervision and 24 hour management of active medical problems listed below. Physiatrist and rehab team continue to assess barriers to discharge/monitor patient progress toward functional and medical goals. FIM: FIM - Bathing Bathing Steps Patient Completed: Chest;Right Arm;Left Arm;Abdomen;Front perineal area;Buttocks;Right upper leg;Left upper leg;Right lower leg (including foot);Left lower leg (including foot) Bathing: 4: Steadying assist  FIM - Upper Body Dressing/Undressing Upper body dressing/undressing steps patient completed: Thread/unthread left sleeve of front closure shirt/dress;Thread/unthread right sleeve of front closure shirt/dress;Pull shirt around back of front closure shirt/dress Upper body dressing/undressing: 4: Min-Patient completed 75 plus % of tasks FIM - Lower Body Dressing/Undressing Lower body dressing/undressing steps patient completed: Thread/unthread right underwear leg;Thread/unthread left underwear leg;Pull underwear up/down;Thread/unthread right pants leg;Thread/unthread left pants leg;Pull pants up/down;Fasten/unfasten pants;Don/Doff right sock;Don/Doff left sock Lower body dressing/undressing: 4: Steadying Assist  FIM - Toileting Toileting steps completed by patient: Adjust clothing prior to toileting;Performs perineal hygiene;Adjust clothing after toileting Toileting Assistive Devices: Grab bar or rail for support Toileting: 4: Steadying assist  FIM - Diplomatic Services operational officer Devices: Grab bars Toilet Transfers: 4-To toilet/BSC: Min A (steadying Pt. > 75%);4-From toilet/BSC: Min A (steadying Pt. > 75%)  FIM - Bed/Chair Transfer Bed/Chair Transfer Assistive Devices: Arm rests Bed/Chair Transfer: 4: Supine > Sit: Min A (steadying  Pt. > 75%/lift 1 leg);4: Sit > Supine: Min A (steadying pt. > 75%/lift 1 leg);4: Bed > Chair or W/C: Min A (steadying Pt. > 75%);4: Chair or W/C > Bed: Min A (steadying Pt. > 75%)  FIM - Locomotion: Wheelchair Distance: 150 Locomotion: Wheelchair: 0:  Activity did not occur FIM - Locomotion: Ambulation Locomotion: Ambulation Assistive Devices: Walker - Rolling Ambulation/Gait Assistance: 4: Min assist;3: Mod assist Locomotion: Ambulation: 3: Travels 150 ft or more with moderate assistance (Pt: 50 - 74%)  Comprehension Comprehension Mode: Auditory Comprehension: 5-Follows basic conversation/direction: With no assist  Expression Expression Mode: Verbal Expression: 5-Expresses basic 90% of the time/requires cueing < 10% of the time.  Social Interaction Social Interaction: 5-Interacts appropriately 90% of the time - Needs monitoring or encouragement for participation or interaction.  Problem Solving Problem Solving: 4-Solves basic 75 - 89% of the time/requires cueing 10 - 24% of the time  Memory Memory: 5-Recognizes or recalls 90% of the time/requires cueing < 10% of the time  Medical Problem List and Plan:  1. Thrombotic left medullary, right paracentral pontine infarct  2. DVT Prophylaxis/Anticoagulation: Subcutaneous heparin. Monitor platelet counts and any signs of bleeding  3. Pain Management: Tylenol as needed for headache--this has been effective 4. Mood: Zoloft 50 mg daily, Ativan 0.5 mg every 4 as needed anxiety  5. Neuropsych: This patient is capable of making decisions on his own behalf.  6. Dysphagia. Patient currently n.p.o. with nasogastric tube feeds.   7. Hypertension. Norvasc 5 mg daily.  Borderline controlled at present  8. Hyperlipidemia. Lipitor  9. Diabetes mellitus with peripheral neuropathy. controlledHemoglobin A1c 7.4. Continue sliding scale insulin. Patient on Glucophage 500 mg twice a day prior to admission. Blood sugars have been consistently in the 150-170  range. --restart Glucophage expect improvement once off TF    LOS (Days) 6 A FACE TO FACE EVALUATION WAS PERFORMED  KIRSTEINS,ANDREW E 08/11/2013, 9:49 AM

## 2013-08-11 NOTE — Progress Notes (Signed)
Occupational Therapy Weekly Progress Note  Patient Details  Name: Keith [name] MRN: [id number] Date of Birth: Mar 15, 1946  Today's Date: 08/11/2013 Time: 7829-5621 Time Calculation (min): 44 min  Patient has met 2 of 5 short term goals.  Mr. Kutz continues to need overall min assist for functional transfers related to selfcare tasks.  He needs min guard to min assist for bathing and dressing tasks, which are currently being performed at the sink based on the use of the NG feeding tube.  He still demonstrates mild ataxia in the LUE with functional use but he is able to use it for most bathing and dressing tasks.  He does exhibit increased difficulty with FM tasks such as fastening buttons or tying his pants.  With dynamic standing tasks he continues to demonstrate ataxic LLE movement as well as frequent LOB to the left.  Recommend continued OT to progress to supervision level for all selfcare tasks and functional transfers.    Patient continues to demonstrate the following deficits: decreased balance, decreased LUE functional use and coordination, increased ataxia, and therefore will continue to benefit from skilled OT intervention to enhance overall performance with BADL.  Patient progressing toward long term goals..  Continue plan of care.  OT Short Term Goals Week 2:  OT Short Term Goal 1 (Week 2): Pt will perform bathing sit to stand with supervision. OT Short Term Goal 2 (Week 2): Pt will perform dressing with supervision sit to stand. OT Short Term Goal 3 (Week 2): Pt will perform toileting and toilet transfers with supervision. OT Short Term Goal 4 (Week 2): Pt will use the LUE at a diminshed level for selfcare tasks with independence.  Skilled Therapeutic Interventions/Progress Updates:    Pt performed bathing and dressing during the session.  Began by gathering items and clothing with min assist and without use of assistive device.  Pt with frequent LOB without use of assistive device.   Performed bathing and dressing with min guard assist sit to stand.  Pt needed assistance to unbutton his shirt when removing it but then he was able to donn and button his new shirt.  Needed increased time to tie his pants.  Slight ataxia noted with sit to stand standing and with the LUE use.  Therapy Documentation Precautions:  Precautions Precautions: Fall Precaution Comments: NPO, NG feeding tube in place, ataxia Restrictions Weight Bearing Restrictions: No  Pain: Pain Assessment Pain Assessment: No/denies pain Pain Score: 4  Pain Type: Acute pain Pain Location: Head Pain Descriptors / Indicators: Aching Pain Frequency: Occasional Pain Intervention(s): Medication (See eMAR) ADL: See FIM for current functional status  Therapy/Group: Individual Therapy  Trista Ciocca OTR/L 08/11/2013, 11:42 AM

## 2013-08-11 NOTE — Progress Notes (Signed)
Occupational Therapy Session Note  Patient Details  Name: Keith [name] MRN: [id number] Date of Birth: 06/23/1946  Today's Date: 08/11/2013 Time: 1531-1601 Time Calculation (min): 30 min  Skilled Therapeutic Interventions/Progress Updates:    Pt worked on Chief of Staff during session to help increase LUE function and LLE function.  Had pt wear weighted belt while working on squatting down to pick up clothespins from a container using the LUE and place them on the vertical pole.  Pt needing min assist to maintain balance while squatting down and returning to standing position.  Pt needed rest breaks after 7- 8 intervals.  Had pt work on heavy weightbearing through his UEs and trunk by having pt push against therapist to walk back down toward his room with min assist.  Once in the room pt performed toilet transfer with min assist as well.  He was able to perform toilet hygiene and clothing with min assist as well.     Therapy Documentation Precautions:  Precautions Precautions: Fall Precaution Comments: NPO, NG feeding tube in place, ataxia Restrictions Weight Bearing Restrictions: No  Pain: Pain Assessment Pain Assessment: No/denies pain Pain Score: 6  Pain Type: Acute pain Pain Location: Generalized Pain Descriptors / Indicators: Aching Pain Intervention(s): Medication (See eMAR) ADL: See FIM for current functional status  Therapy/Group: Individual Therapy  Denetta Fei OTR/L 08/11/2013, 4:02 PM

## 2013-08-11 NOTE — Progress Notes (Signed)
Speech Language Pathology Daily Session Note  Patient Details  Name: Keith [name] MRN: [id number] Date of Birth: November 12, 1945  Today's Date: 08/11/2013 Time: 4098-1191 Time Calculation (min): 45 min  Short Term Goals: Week 1: SLP Short Term Goal 1 (Week 1): Pt will perform pharyngeal strengthening exercises with Mod cues SLP Short Term Goal 2 (Week 1): Pt will initiate a volitional pharyngeal swallow x10 with Min cues SLP Short Term Goal 3 (Week 1): Pt will utilize speech intelligibility strategies with Min cues SLP Short Term Goal 4 (Week 1): Pt will perform oral motor exercises with Min cues  Skilled Therapeutic Interventions: Skilled treatment session focused on addressing dysphagia and self-care goals.  SLP facilitated session with set-up of oral care and Mod verbal cues to recall procedures for oral care via suctioning as well as Min verbal cues for thoroughness.  SLP also facilitated session by requesting patient recall and perform previously taught pharyngeal strengthening exercises; patient required Mod cues to recall and perform effectively.  SLP initiated trials of puree with Max faded to Min verbal cues to utilize 3 swallows, a throat clear and an additional swallow.  Patient reported intermittent sensation of something being in his throat, which SLP suspects was pharyngeal residue that appeared to clear with cues of additional hard swallows.        FIM:  Comprehension Comprehension Mode: Auditory Comprehension: 5-Follows basic conversation/direction: With extra time/assistive device Expression Expression Mode: Verbal Expression: 4-Expresses basic 75 - 89% of the time/requires cueing 10 - 24% of the time. Needs helper to occlude trach/needs to repeat words. Social Interaction Social Interaction: 5-Interacts appropriately 90% of the time - Needs monitoring or encouragement for participation or interaction. Problem Solving Problem Solving: 4-Solves basic 75 - 89% of the time/requires  cueing 10 - 24% of the time Memory Memory: 3-Recognizes or recalls 50 - 74% of the time/requires cueing 25 - 49% of the time FIM - Eating Eating Activity: 1: Helper feeds patient  Pain Pain Assessment Pain Assessment: No/denies pain  Therapy/Group: Individual Therapy  Charlane Ferretti., CCC-SLP [phone]  [name] 08/11/2013, 12:31 PM

## 2013-08-11 NOTE — Progress Notes (Signed)
Physical Therapy Session Note  Patient Details  Name: Keith [name] MRN: [id number] Date of Birth: Aug 05, 1946  Today's Date: 08/11/2013 Time: 1310-1345 Time Calculation (min): 35 min  Short Term Goals: Week 1:  PT Short Term Goal 1 (Week 1): Pt will perform all aspects of bed mobility with HOB flat and without handrails at min assist consistently and safely PT Short Term Goal 2 (Week 1): Pt will perform stand pivot transfers with min assist to different level surfaces.  PT Short Term Goal 3 (Week 1): Pt will perform dynamic standing balance at min assist 5 mins at a time to increase balance and activity tolerance.  PT Short Term Goal 4 (Week 1): Pt will ambulate x 100' w/ LRAD at min assist  PT Short Term Goal 5 (Week 1): Pt will ascend/descend 3 stairs with B handrails at min assist  Skilled Therapeutic Interventions/Progress Updates:   First PM session:  Pt received sitting in w/c in room and agreeable to therapy.  Called RN for assistance in disconnecting NG tube during therapy and also to give pain medication prior to session.  Ambulated >150' to/from gym in order to improve gait quality.  Note that ataxia is improving, however is increased with turns.  Continue to provide min cues for upright posture, heel to toe gait pattern, and increased stride length.  Provided min manual assist for increased weight shift to the L during gait.  Once in gym, focused on gait without AD and high level gait side to side and backwards to further challenge balance, increased weight shifting, WB through LLE and upright posture.  Pt requires min assist for side to side and forwards walking, however note increased difficulty and mod assist with mod cues for increased glute activation for upright stance and toe to heel contact with floor and larger step length bilaterally.  Note he tends to lead with buttocks as well when ambulating backwards.  Allowed several rest breaks during different directions of gait due to  increased fatigue.  Then performed hip/ankle strategy techniques by leaning backwards on counter and using hips to elevate off of counter.  Ambulated back to room and left in w/c with quick release belt donned and re-connected NG tube.  All needs in reach.    Second PM session:  Pt received sitting in w/c in room and agreeable to therapy this afternoon.  Disconnected NG tube prior to to session.  Ambulated to gym at min assist with cues and assist mentioned above.  Focus of session was coordination activities and high level balance and gait to decrease ataxia and improve balance.  Performed obstacle course negotiating around and over objects x 20' x 2 reps, progressing to only negotiating around cones in closer proximity.  Note he initially required mod assist to prevent LOB due to narrow BOS at times and also due to LOB posteriorly, however as he continued with task, was able to perform at min assist level with improved balance noted.  Performed standing activity tapping to small cups on floor in alternating pattern to work on increased weight shift left, increased balance with SLS, and also to work on coordinating movements.  Again, requires intermittent min to mod assist at times for LOB.  Also performed ambulation while kicking weighted box in order to work on balance with modified SLS and also to decrease ataxic movement.  Ended session with gait with weighted RW in order to decrease ataxia during gait.  Noted improved quality, esp with turns and required min to  min/guard assist.  Pt left in gym for next OT session.  Rehab tech present in gym to provide supervision.    Therapy Documentation Precautions:  Precautions Precautions: Fall Precaution Comments: NPO, NG feeding tube in place, ataxia Restrictions Weight Bearing Restrictions: No General: Amount of Missed PT Time (min): 10 Minutes Missed Time Reason: Nursing care (giving meds and disconnecting NG tube)   Pain: Pain Assessment Pain  Assessment: 0-10 Pain Score: 6  Pain Type: Acute pain Pain Location: Generalized Pain Descriptors / Indicators: Aching Pain Intervention(s): Medication (See eMAR)   Locomotion : Ambulation Ambulation/Gait Assistance: 4: Min assist   See FIM for current functional status  Therapy/Group: Individual Therapy  Vista Deck 08/11/2013, 3:28 PM

## 2013-08-12 ENCOUNTER — Inpatient Hospital Stay (HOSPITAL_COMMUNITY): Payer: Medicare Other | Admitting: Occupational Therapy

## 2013-08-12 ENCOUNTER — Inpatient Hospital Stay (HOSPITAL_COMMUNITY): Payer: Medicare Other | Admitting: Rehabilitation

## 2013-08-12 ENCOUNTER — Inpatient Hospital Stay (HOSPITAL_COMMUNITY): Payer: Medicare Other | Admitting: Speech Pathology

## 2013-08-12 DIAGNOSIS — I69993 Ataxia following unspecified cerebrovascular disease: Secondary | ICD-10-CM

## 2013-08-12 DIAGNOSIS — F1010 Alcohol abuse, uncomplicated: Secondary | ICD-10-CM

## 2013-08-12 DIAGNOSIS — I10 Essential (primary) hypertension: Secondary | ICD-10-CM

## 2013-08-12 DIAGNOSIS — I6330 Cerebral infarction due to thrombosis of unspecified cerebral artery: Secondary | ICD-10-CM

## 2013-08-12 DIAGNOSIS — I69991 Dysphagia following unspecified cerebrovascular disease: Secondary | ICD-10-CM

## 2013-08-12 LAB — GLUCOSE, CAPILLARY
Glucose-Capillary: 148 mg/dL — ABNORMAL HIGH (ref 70–99)
Glucose-Capillary: 169 mg/dL — ABNORMAL HIGH (ref 70–99)
Glucose-Capillary: 210 mg/dL — ABNORMAL HIGH (ref 70–99)

## 2013-08-12 MED ORDER — JEVITY 1.2 CAL PO LIQD
1000.0000 mL | ORAL | Status: DC
  Administered 2013-08-12: 80 mL/h
  Administered 2013-08-13 – 2013-08-14 (×2): 1000 mL
  Filled 2013-08-12 (×14): qty 1000

## 2013-08-12 MED ORDER — JEVITY 1.2 CAL PO LIQD
1000.0000 mL | ORAL | Status: DC
  Filled 2013-08-12: qty 1000

## 2013-08-12 NOTE — Progress Notes (Signed)
Occupational Therapy Session Note  Patient Details  Name: Keith [name] MRN: [id number] Date of Birth: September 03, 1945  Today's Date: 08/12/2013 Time: 0800-0900 Time Calculation (min): 60 min  Short Term Goals: Week 2:  OT Short Term Goal 1 (Week 2): Pt will perform bathing sit to stand with supervision. OT Short Term Goal 2 (Week 2): Pt will perform dressing with supervision sit to stand. OT Short Term Goal 3 (Week 2): Pt will perform toileting and toilet transfers with supervision. OT Short Term Goal 4 (Week 2): Pt will use the LUE at a diminshed level for selfcare tasks with independence.  Skilled Therapeutic Interventions/Progress Updates:  Patient resting in w/c upon arrival and NT came in to disconnect NG feeding tube for self care therapy session.   Focused session on dynamic balance, activity tolerance, walker safety, and increased LUE use/function.  Patient gathered clothes using his weighted RW and needed mod cues for walker safety during functional tasks, performed bath, dressing and groom at sink in sit and stand.  Encouraged patient to stand more during BADL and grooming tasks to challenge his endurance and balance.  Patient agreed to either stand during UB bath and/or during grooming tasks.  Patient left with all items in place, QRB on and talking to his son on the phone to ask him to bring some regular street clothes and shoes.    Therapy Documentation Precautions:  Precautions Precautions: Fall Precaution Comments: NPO, NG feeding tube in place, ataxia Restrictions Weight Bearing Restrictions: No Pain: 2/10 left shoulder, declined medication, rest and repositioned. ADL: See FIM for current functional status  Therapy/Group: Individual Therapy  Bessie Livingood 08/12/2013, 9:17 AM

## 2013-08-12 NOTE — Patient Care Conference (Signed)
Inpatient RehabilitationTeam Conference and Plan of Care Update Date: 08/11/2013   Time: 11:30 AM    Patient Name: Keith [name]      Medical Record Number: [id number]  Date of Birth: Sep 14, 1945 Sex: Male         Room/Bed: 4W26C/4W26C-01 Payor Info: Payor: MEDICARE / Plan: MEDICARE PART A AND B / Product Type: *No Product type* /    Admitting Diagnosis: B CVA  Admit Date/Time:  08/05/2013  4:32 PM Admission Comments: No comment available   Primary Diagnosis:  <principal problem not specified> Principal Problem: <principal problem not specified>  Patient Active Problem List   Diagnosis Date Noted  . Dysarthria due to cerebrovascular accident 08/05/2013  . Weakness 08/05/2013  . Type II or unspecified type diabetes mellitus without mention of complication, not stated as uncontrolled 08/05/2013  . Essential hypertension, benign 08/05/2013  . Depression with anxiety 08/05/2013  . Alcohol abuse 08/05/2013  . Facial droop 07/31/2013  . CVA (cerebral infarction) 07/31/2013    Expected Discharge Date:  pt will transfer to SNF  Team Members Present: Physician leading conference: Dr. Claudette Laws Social Worker Present: Dossie Der, LCSW;Jenny Reya Aurich, LCSW Nurse Present: Other (comment) Berta Minor, RN) PT Present: Harriet Butte, PT OT Present: Bretta Bang, OT;Sarah Hoxie, Heath Lark, OT SLP Present: Fae Pippin, SLP PPS Coordinator present : Tora Duck, RN, CRRN;Becky Henrene Dodge, PT     Current Status/Progress Goal Weekly Team Focus  Medical   NPO, poor result on repeat MBS  establish reliable route for nutrition  cont swallow retraining and consider PEG   Bowel/Bladder   cont of bowel and bladder; min assist with emptying of urinal  cont of bowel and bladder with min assist  monitor for constipation/diarrhea   Swallow/Nutrition/ Hydration   NPO     trials of puree and pharyngeal strengthening with SLP   ADL's   Pt is currenlty supervison for UB bathing and min  guard assist for LB bathing and dressing,  he needs min assist for UB dressing to fasten his shirt buttons.  Pt needs min assist for transfers to the toilet using the RW.  He demonstrates mild ataxia in the LUE and trunk.  Goals are overall supervison level for bathing, dressing, toileting.      Mobility   Pt currently requires min assist for bed mobility, min assist for standing, mod assist for higher level balance, min assist for gait with RW, mod assist without AD, min assist for stair negotiation.  continues to have increased ataxia with gait, however is improved since eval.   supervision overall  NMR for LUE/LE, high level balance, ankle/hip stategy, transfers, bed mobility, gait, stairs, pt/family education,    Communication   Mod assist   Supervision       Safety/Cognition/ Behavioral Observations  Supervision   Mod assist   increase use of externl aids   Pain   prn tramadol and tyelonol for headache as needed  pain level less than 3/10  asses pain qshift and prn, medicate as needed   Skin   No skin issues  inspect skin daily for s/s of breakdown       Rehab Goals Patient on target to meet rehab goals: Yes Rehab Goals Revised: None *See Care Plan and progress notes for long and short-term goals.  Barriers to Discharge: see above    Possible Resolutions to Barriers:  see above    Discharge Planning/Teaching Needs:  Pt does not have a 24/7 caregiver.  He will need  SNF after his stay on [facility].  None at this time.   Team Discussion:  Pt is having a hard time remembering exercises SLP is teaching him, so he is not able to carryover to the next session.  Pt did poorly on MBS and team discussed the need for a PEG.  MD will discuss with pt.  Pt is using a walker with PT and is min assist with OT.   Revisions to Treatment Plan:  Pt will need 24/7 supervision with some minimal assistance and he does not have anyone who can provide that care. CSW will pursue SNF placement when pt is  medically stable.     Continued Need for Acute Rehabilitation Level of Care: The patient requires daily medical management by a physician with specialized training in physical medicine and rehabilitation for the following conditions: Daily direction of a multidisciplinary physical rehabilitation program to ensure safe treatment while eliciting the highest outcome that is of practical value to the patient.: Yes Daily medical management of patient stability for increased activity during participation in an intensive rehabilitation regime.: Yes Daily analysis of laboratory values and/or radiology reports with any subsequent need for medication adjustment of medical intervention for : Neurological problems  Channin Agustin, Vista Deck 08/12/2013, 1:39 PM

## 2013-08-12 NOTE — Progress Notes (Addendum)
NUTRITION FOLLOW UP  Intervention:   1. Enteral nutrition; Transition to Jevity 1.2 @ 65 mL/hr.  Advance by 10 mL q 4 hrs to 80 mL/hr goal to provide 1920 kcal (100% estimated needs), 88g protein (92% estimated needs), and 1312 mL free water.  Pt is also receiving 200 mL free water flushes TID for a total of 2079 mL free water daily.  2. Modify diet; resume PO diet once medically appropriate per MD/SLP discretion. Pt likely to need long-term nutrition support and is planning for PEG placement next week.   NUTRITION DIAGNOSIS:  Inadequate oral intake related to inability to eat, dysphagia as evidenced by NPO for safety.   Monitor:  1. Enteral nutrition; initiation with tolerance. Pt to meet >/=90% estimated needs with nutrition support. Met.  Pt c/o constipation.  To transition to fiber-containing formula 2. Wt/wt change; monitor trends. Ongoing.  Wt is stable 3. Food/Beverage; diet advancement per MD/SLP discretion with pt meeting >/=90% estimated needs with tolerance.  Ongoing.  Pt continues work with SLP, however is not appropriate for PO diet at this time.   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   Pt was followed by acute speech therapy services who determined pt was not appropriate for POs and recommended alternative means of nutrition. Pt has been maintained on Glucerna 1.2 via NGT @ 65 mL/hr with tolerance. Plans for PEG tube placement (12/17).  RD met with pt who states comfort and tolerance with current TF regimen, although he does states he is not having regular bowel movements and feels as though he has been constipation.   RD notes pt is having a BM q 3 days or so.   Discussed with PA who gives verbal authorization for RD to manage TF and transition to fiber-containing formula.  TF rate needs to be adjusted to meet estimated needs in 20 hrs.   Height: Ht Readings from Last 1 Encounters:  07/31/13 6\' 1"  (1.854 m)    Weight  Status:   Wt Readings from Last 1 Encounters:  08/12/13 170 lb 1.6 oz (77.157 kg)    Re-estimated needs:  Kcal: 1900-2100 Protein: 95-105g Fluid: >2.0 L/day  Skin: intact  Diet Order: NPO   Intake/Output Summary (Last 24 hours) at 08/12/13 0952 Last data filed at 08/11/13 2055  Gross per 24 hour  Intake      0 ml  Output   1550 ml  Net  -1550 ml    Last BM: 12/9   Labs:   Recent Labs Lab 08/05/13 1835 08/06/13 0545  NA  --  137  K  --  3.6  CL  --  102  CO2  --  27  BUN  --  9  CREATININE 0.56 0.60  CALCIUM  --  8.5  GLUCOSE  --  168*    CBG (last 3)   Recent Labs  08/11/13 2354 08/12/13 0341 08/12/13 0716  GLUCAP 169* 166* 156*    Scheduled Meds: . amLODipine  5 mg Per NG tube Daily  . aspirin  300 mg Rectal Daily   Or  . aspirin  325 mg Oral Daily  . atorvastatin  80 mg Per NG tube q1800  . fluticasone  2 spray Each Nare BID  . folic acid  1 mg Per Tube Daily  . free water  200 mL Per Tube Q8H  . heparin  5,000 Units Subcutaneous Q8H  . insulin aspart  0-15 Units Subcutaneous  Q4H  . metFORMIN  500 mg Oral Q breakfast  . multivitamin with minerals  1 tablet Per Tube Daily  . sertraline  50 mg Oral Daily    Continuous Infusions: . feeding supplement (GLUCERNA 1.2 CAL) 1,000 mL (08/11/13 0800)    Loyce Dys, MS RD LDN Clinical Inpatient Dietitian Pager: [phone] Weekend/After hours pager: [phone]

## 2013-08-12 NOTE — Progress Notes (Signed)
67 y.o. male with history of DM, HTN, who was admitted on 07/31/13 with left facial droop with dysarthria and one week history of imbalance as diplopia X 2 days. MRI/MRA brain done revealing two small acute brainstem infarcts in-right paracentral pons, and central to left paracentral medulla possibly involving left pyramid, no stenosis. 2D echo with EF 55-60% and grade 1 diastolic dysfunction. Carotid dopplers with 1-39% ICA stenosis. Neurology services consulted maintained on aspirin and Plavix therapy for CVA prophylaxis. Subcutaneous heparin added for DVT prophylaxis.Marland Kitchen MBS done 08/01/13 alternative nutritional means and NPO recommended    Subjective/Complaints: No SOB, no CP MBS 12/9 still NPO Occ cough  Discussed expected duration of recovery for both swallow and mobility Review of Systems - otherwise negative   Objective: Vital Signs: Blood pressure 125/64, pulse 62, temperature 98.1 F (36.7 C), temperature source Oral, resp. rate 18, weight 77.157 kg (170 lb 1.6 oz), SpO2 96.00%. Dg Swallowing Func-speech Pathology  08/10/2013   [name], CCC-SLP     08/10/2013  5:06 PM Objective Swallowing Evaluation: Modified Barium Swallowing Study   Patient Details  Name: Keith [name] MRN: [id number] Date of Birth: 18-Jan-1946  Today's Date: 08/10/2013 Time:0930 - 1000  30 Minutes  Past Medical History:  Past Medical History  Diagnosis Date  . Diabetes mellitus without complication   . Hypertension    Past Surgical History:  Past Surgical History  Procedure Laterality Date  . Prostate surgery    . Knee surgery     HPI:  67 y.o. male admitted to [facility] with left facial droop, dysarthria,  left UE and LE weakness- pt found to have 2 brain stem strokes  impacting right paracentral pons central to left paracent  medulla.  MBS ordered due to concerns for dysphagia.  Pt admits  to h/o "sore throat" and transient problems swallowing in the  last few weeks.  Pt is a previous smoker, CXR negative 11/29.  Pt  admitted  to CIR 08/16/13 and has been participating in therapies.  Repeat objective study ordered today to determine readiness for  p.o.       Recommendation/Prognosis  Clinical Impression Patient with functional improvement as compared to previous  objective assessment from 11/30.  However, he continues to  demonstrate a moderate oral and severe pharyngeal-cervical  esophageal phase dysphagia with minimal improvements.  Patient's  dysphagia continues to be characterized by delayed oral  transit/coordination resulting in premature spillage of boluses  into pharynx as well as delayed swallow initiation.  This often  resulted in penetration prior to swallow initiation as well as  after due to copious amounts of residuals that remained in  pharynx post swallow.  SLP provided cues for 3-4 multiple quick  swallows with intermittent throat clears, which were effective at  protecting airway with puree textures only.  Patient is a high  aspiration risk currently due to level of dysphagia and p.o.  trials are only recommended with SLP at this time.  MD to discuss  options: long-term versus short-term alternative means with  potential for progress in another week's time.    Dysphagia Diagnosis: Severe pharyngeal phase dysphagia;Moderate  oral phase dysphagia;Moderate cervical esophageal phase dysphagia Swallow Evaluation Recommendations Diet Recommendations: NPO;Alternative means -  long-term;Alternative means - temporary (short term versus long  term) Medication Administration: Via alternative means Oral Care Recommendations: Oral care Q4 per protocol Follow up Recommendations: Outpatient SLP;24 hour  supervision/assistance Prognosis Prognosis for Safe Diet Advancement: Fair Barriers to Reach Goals: Severity of dysphagia Individuals  Consulted Consulted and Agree with Results and Recommendations: Patient;RN  SLP Assessment/Plan Refer to care plan   Short Term Goals: Week 1: SLP Short Term Goal 1 (Week 1): Pt will perform  pharyngeal  strengthening exercises with Mod cues SLP Short Term Goal 2 (Week 1): Pt will initiate a volitional  pharyngeal swallow x10 with Min cues SLP Short Term Goal 3 (Week 1): Pt will utilize speech  intelligibility strategies with Min cues SLP Short Term Goal 4 (Week 1): Pt will perform oral motor  exercises with Min cues  General:  Date of Onset: 08/01/13 HPI: 67 y.o. male admitted to [facility] with left facial droop,  dysarthria, left UE and LE weakness- pt found to have 2 brain  stem strokes impacting right paracentral pons central to left  paracent medulla.  MBS ordered due to concerns for dysphagia.  Pt  admits to h/o "sore throat" and transient problems swallowing in  the last few weeks.  Pt is a previous smoker, CXR negative 11/29.   Pt admitted to CIR 08/16/13 and has been participating in  therapies. Repeat objective study ordered today to determine  readiness for p.o.   Type of Study: Modified Barium Swallowing Study Reason for Referral: Objectively evaluate swallowing function Previous Swallow Assessment: MBS 11/30 with recommendations for  NPO Diet Prior to this Study: NPO;Panda Temperature Spikes Noted: No Respiratory Status: Room air History of Recent Intubation: No Behavior/Cognition: Alert;Cooperative;Pleasant mood;Hard of  hearing Oral Cavity - Dentition: Adequate natural dentition Oral Motor / Sensory Function: Impaired - see Bedside swallow  eval Self-Feeding Abilities: Able to feed self Patient Positioning: Upright in chair Baseline Vocal Quality: Low vocal intensity;Hoarse Volitional Cough: Weak Volitional Swallow: Able to elicit Anatomy:  (what appeared to be osteophytes c4-c6 no MD to  confirm) Pharyngeal Secretions: Not observed secondary MBS  Reason for Referral:  Objectively evaluate swallowing function   Oral Phase Oral Preparation/Oral Phase Oral Phase: Impaired Oral - Honey Oral - Honey Teaspoon: Weak lingual manipulation;Delayed oral  transit;Reduced posterior propulsion Oral - Nectar Oral -  Nectar Teaspoon: Not tested Oral - Thin Oral - Thin Teaspoon: Not tested Oral - Solids Oral - Puree: Weak lingual manipulation;Reduced posterior  propulsion;Delayed oral transit Oral - Regular: Not tested Pharyngeal Phase  Pharyngeal Phase Pharyngeal Phase: Impaired Pharyngeal - Honey Pharyngeal - Honey Teaspoon: Pharyngeal residue -  valleculae;Pharyngeal residue - pyriform sinuses;Reduced tongue  base retraction;Penetration/Aspiration before  swallow;Penetration/Aspiration after swallow;Reduced laryngeal  elevation;Reduced anterior laryngeal mobility;Reduced epiglottic  inversion;Reduced pharyngeal peristalsis;Reduced airway/laryngeal  closure;Premature spillage to valleculae;Delayed swallow  initiation;Premature spillage to pyriform sinuses;Trace  aspiration Penetration/Aspiration details (honey teaspoon): Material enters  airway, CONTACTS cords and not ejected out;Material enters  airway, remains ABOVE vocal cords and not ejected out (cued  coughs not effective, material was eventually aspirated) Pharyngeal - Nectar Pharyngeal - Nectar Teaspoon: Not tested Pharyngeal - Thin Pharyngeal - Thin Teaspoon: Not tested Pharyngeal - Solids Pharyngeal - Puree: Premature spillage to valleculae;Delayed  swallow initiation;Reduced pharyngeal peristalsis;Reduced  epiglottic inversion;Reduced anterior laryngeal mobility;Reduced  laryngeal elevation;Reduced airway/laryngeal closure;Reduced  tongue base retraction;Penetration/Aspiration before  swallow;Pharyngeal residue - valleculae;Pharyngeal residue -  pyriform sinuses;Penetration/Aspiration after swallow;Pharyngeal  residue - posterior pharnyx Penetration/Aspiration details (puree): Material does not enter  airway;Material enters airway, remains ABOVE vocal cords then  ejected out;Material enters airway, remains ABOVE vocal cords and  not ejected out (cues throat clears/coughs were effective at  reducing penetrates) Pharyngeal - Regular: Not tested Cervical Esophageal Phase   Cervical Esophageal Phase Cervical Esophageal Phase: Impaired Cervical  Esophageal Phase - Comment Cervical Esophageal Comment: continued decreased UES opening due  to poor laryngeal elevation which is reduced with cues for  multiple hard swallows.   G-Codes   Charlane Ferretti., CCC-SLP [phone]  BOWIE,MELISSA 08/10/2013, 10:33 AM     Results for orders placed during the hospital encounter of 08/05/13 (from the past 72 hour(s))  GLUCOSE, CAPILLARY     Status: Abnormal   Collection Time    08/09/13 11:45 AM      Result Value Range   Glucose-Capillary 146 (*) 70 - 99 mg/dL   Comment 1 Notify RN    GLUCOSE, CAPILLARY     Status: Abnormal   Collection Time    08/09/13  4:35 PM      Result Value Range   Glucose-Capillary 169 (*) 70 - 99 mg/dL  GLUCOSE, CAPILLARY     Status: Abnormal   Collection Time    08/09/13  7:56 PM      Result Value Range   Glucose-Capillary 157 (*) 70 - 99 mg/dL   Comment 1 Notify RN    GLUCOSE, CAPILLARY     Status: Abnormal   Collection Time    08/10/13 12:05 AM      Result Value Range   Glucose-Capillary 154 (*) 70 - 99 mg/dL   Comment 1 Notify RN    GLUCOSE, CAPILLARY     Status: Abnormal   Collection Time    08/10/13  3:56 AM      Result Value Range   Glucose-Capillary 165 (*) 70 - 99 mg/dL   Comment 1 Notify RN    GLUCOSE, CAPILLARY     Status: Abnormal   Collection Time    08/10/13  7:31 AM      Result Value Range   Glucose-Capillary 187 (*) 70 - 99 mg/dL   Comment 1 Notify RN    GLUCOSE, CAPILLARY     Status: Abnormal   Collection Time    08/10/13 11:58 AM      Result Value Range   Glucose-Capillary 153 (*) 70 - 99 mg/dL   Comment 1 Notify RN    GLUCOSE, CAPILLARY     Status: Abnormal   Collection Time    08/10/13  5:04 PM      Result Value Range   Glucose-Capillary 169 (*) 70 - 99 mg/dL   Comment 1 Notify RN    GLUCOSE, CAPILLARY     Status: Abnormal   Collection Time    08/10/13  8:41 PM      Result Value Range   Glucose-Capillary  174 (*) 70 - 99 mg/dL  GLUCOSE, CAPILLARY     Status: Abnormal   Collection Time    08/10/13 11:39 PM      Result Value Range   Glucose-Capillary 175 (*) 70 - 99 mg/dL  GLUCOSE, CAPILLARY     Status: Abnormal   Collection Time    08/11/13  4:16 AM      Result Value Range   Glucose-Capillary 178 (*) 70 - 99 mg/dL  GLUCOSE, CAPILLARY     Status: Abnormal   Collection Time    08/11/13  7:24 AM      Result Value Range   Glucose-Capillary 179 (*) 70 - 99 mg/dL   Comment 1 Notify RN    GLUCOSE, CAPILLARY     Status: Abnormal   Collection Time    08/11/13 11:30 AM      Result Value Range   Glucose-Capillary 158 (*) 70 -  99 mg/dL   Comment 1 Notify RN    GLUCOSE, CAPILLARY     Status: Abnormal   Collection Time    08/11/13  4:22 PM      Result Value Range   Glucose-Capillary 148 (*) 70 - 99 mg/dL   Comment 1 Notify RN    GLUCOSE, CAPILLARY     Status: Abnormal   Collection Time    08/11/13  7:44 PM      Result Value Range   Glucose-Capillary 152 (*) 70 - 99 mg/dL  GLUCOSE, CAPILLARY     Status: Abnormal   Collection Time    08/11/13 11:54 PM      Result Value Range   Glucose-Capillary 169 (*) 70 - 99 mg/dL  GLUCOSE, CAPILLARY     Status: Abnormal   Collection Time    08/12/13  3:41 AM      Result Value Range   Glucose-Capillary 166 (*) 70 - 99 mg/dL  GLUCOSE, CAPILLARY     Status: Abnormal   Collection Time    08/12/13  7:16 AM      Result Value Range   Glucose-Capillary 156 (*) 70 - 99 mg/dL   Comment 1 Notify RN       HEENT: normal Cardio: RRR and no murmur Resp: CTA B/L and unlabored GI: BS positive and non distended Extremity:  Pulses positive and No Edema Skin:   Intact Neuro: a little slow this am, Cranial Nerve Abnormalities Left central 7, Abnormal Motor 3-/5 LUE, 3-/5 LLE, 5/5 on  Right side, Abnormal FMC Ataxic/ dec FMC and Dysarthric. Severe dymetria left FNF and Left Heel to Shin Musc/Skel:  Normal GEN: NAD   Assessment/Plan: 1. Functional deficits  secondary to Right pontine and Left medullary infarcts which require 3+ hours per day of interdisciplinary therapy in a comprehensive inpatient rehab setting. Physiatrist is providing close team supervision and 24 hour management of active medical problems listed below. Physiatrist and rehab team continue to assess barriers to discharge/monitor patient progress toward functional and medical goals. FIM: FIM - Bathing Bathing Steps Patient Completed: Chest;Right Arm;Left Arm;Abdomen;Front perineal area;Buttocks;Right upper leg;Left upper leg;Right lower leg (including foot);Left lower leg (including foot) Bathing: 4: Steadying assist  FIM - Upper Body Dressing/Undressing Upper body dressing/undressing steps patient completed: Thread/unthread left sleeve of front closure shirt/dress;Thread/unthread right sleeve of front closure shirt/dress;Pull shirt around back of front closure shirt/dress;Button/unbutton shirt Upper body dressing/undressing: 5: Supervision: Safety issues/verbal cues FIM - Lower Body Dressing/Undressing Lower body dressing/undressing steps patient completed: Thread/unthread right underwear leg;Thread/unthread left underwear leg;Pull underwear up/down;Thread/unthread right pants leg;Thread/unthread left pants leg;Pull pants up/down;Fasten/unfasten pants;Don/Doff right sock;Don/Doff left sock Lower body dressing/undressing: 4: Steadying Assist  FIM - Toileting Toileting steps completed by patient: Adjust clothing prior to toileting;Performs perineal hygiene;Adjust clothing after toileting Toileting Assistive Devices: Grab bar or rail for support Toileting: 4: Steadying assist  FIM - Diplomatic Services operational officer Devices: Grab bars Toilet Transfers: 4-To toilet/BSC: Min A (steadying Pt. > 75%);4-From toilet/BSC: Min A (steadying Pt. > 75%)  FIM - Bed/Chair Transfer Bed/Chair Transfer Assistive Devices: Arm rests Bed/Chair Transfer: 4: Supine > Sit: Min A (steadying  Pt. > 75%/lift 1 leg);4: Sit > Supine: Min A (steadying pt. > 75%/lift 1 leg);4: Bed > Chair or W/C: Min A (steadying Pt. > 75%);4: Chair or W/C > Bed: Min A (steadying Pt. > 75%)  FIM - Locomotion: Wheelchair Distance: 150 Locomotion: Wheelchair: 0: Activity did not occur FIM - Locomotion: Ambulation Locomotion: Ambulation Assistive Devices:  Walker - Rolling Ambulation/Gait Assistance: 4: Min assist Locomotion: Ambulation: 3: Travels 150 ft or more with moderate assistance (Pt: 50 - 74%)  Comprehension Comprehension Mode: Auditory Comprehension: 5-Follows basic conversation/direction: With no assist  Expression Expression Mode: Verbal Expression: 5-Expresses basic 90% of the time/requires cueing < 10% of the time.  Social Interaction Social Interaction: 5-Interacts appropriately 90% of the time - Needs monitoring or encouragement for participation or interaction.  Problem Solving Problem Solving: 4-Solves basic 75 - 89% of the time/requires cueing 10 - 24% of the time  Memory Memory: 5-Recognizes or recalls 90% of the time/requires cueing < 10% of the time  Medical Problem List and Plan:  1. Thrombotic left medullary, right paracentral pontine infarct  2. DVT Prophylaxis/Anticoagulation: Subcutaneous heparin. Monitor platelet counts and any signs of bleeding  3. Pain Management: Tylenol as needed for headache--this has been effective 4. Mood: Zoloft 50 mg daily, Ativan 0.5 mg every 4 as needed anxiety  5. Neuropsych: This patient is capable of making decisions on his own behalf.  6. Dysphagia. Patient currently n.p.o. with nasogastric tube feeds.  Will need PEG, pt in agreement will need to touch base with son as well 7. Hypertension. Norvasc 5 mg daily.  Borderline controlled at present  8. Hyperlipidemia. Lipitor  9. Diabetes mellitus with peripheral neuropathy. controlledHemoglobin A1c 7.4. Continue sliding scale insulin. Patient on Glucophage 500 mg twice a day prior to  admission. Blood sugars have been consistently in the 150-170 range.     LOS (Days) 7 A FACE TO FACE EVALUATION WAS PERFORMED  Menashe Kafer E 08/12/2013, 7:50 AM

## 2013-08-12 NOTE — Progress Notes (Signed)
Physical Therapy Session Note  Patient Details  Name: Keith [name] MRN: [id number] Date of Birth: Dec 21, 1945  Today's Date: 08/12/2013 Time: 0925-1000 Time Calculation (min): 35 min  Short Term Goals: Week 1:  PT Short Term Goal 1 (Week 1): Pt will perform all aspects of bed mobility with HOB flat and without handrails at min assist consistently and safely PT Short Term Goal 2 (Week 1): Pt will perform stand pivot transfers with min assist to different level surfaces.  PT Short Term Goal 3 (Week 1): Pt will perform dynamic standing balance at min assist 5 mins at a time to increase balance and activity tolerance.  PT Short Term Goal 4 (Week 1): Pt will ambulate x 100' w/ LRAD at min assist  PT Short Term Goal 5 (Week 1): Pt will ascend/descend 3 stairs with B handrails at min assist  Skilled Therapeutic Interventions/Progress Updates:   Pt received sitting in w/c in room with nurse present providing meds through NG tube, therefore pt missed first 10 mins of therapy session.  Continued session with ambulation to/from gym >150' (to gym without weighted brace and without RW and from gym with weighted brace and without RW) at min/mod assist level to further challenge balance and provide increased proprioceptive feedback to decrease ataxic movement patterns.  Performed high level gait and balance activity throughout session.  See full details below.  Pt left in room in w/c with quick release belt donned and all needs in reach.    Therapy Documentation Precautions:  Precautions Precautions: Fall Precaution Comments: NPO, NG feeding tube in place, ataxia Restrictions Weight Bearing Restrictions: No General: Amount of Missed PT Time (min): 10 Minutes Missed Time Reason: Nursing care (meds through NG tube)   Pain: Pain Assessment Pain Assessment: No/denies pain   Locomotion : Ambulation Ambulation/Gait Assistance: 4: Min assist;3: Mod assist High Level Ambulation High Level Ambulation:  Backwards walking;Direction changes Backwards Walking: Treatment session focused on different high level ambulation while having weighted belt on for increased proprioceptive feedback for decreased ataxic movements during ambulation and direction changes.  Performed negotiation around obstacles (x 35' x 2 reps) in close proximity at min assist level with intermittent LOB, however pt better able to perform stepping strategy today in order to prevent fall/requiring more assist.  Also performed backwards walking (x 40' x 2 reps) while wearing weighted brace around waist at min assist, progressing to resisted backwards walking for increased proprioceptive feedback.  Performed all high level gait/balance at min assist level.  continue to note that he does scissor feet, esp when ambulating around obstacles, but better with continued practice.       Balance: Balance Balance Assessed: Yes See FIM for current functional status  Therapy/Group: Individual Therapy  Vista Deck 08/12/2013, 12:57 PM

## 2013-08-12 NOTE — Progress Notes (Signed)
Social Work Patient ID: Keith [name], male   DOB: 11/24/1945, 67 y.o.   MRN: [id number]  CSW met with pt to update him on the team conference discussion.  Told pt that we discussed his MBS and how best to provide him with nutrition.  Informed pt that MD would f/u with him about that.  Told him that PT and OT feel he is progressing well.  Talked with pt about him not having a 24/7 caregiver and pt agreed he does not.  Told pt that CSW had spoken with the son about SNF transfer.  Pt agreed that would be the best thing for him initially.  CSW spoke with son again to update him on the above and told him that MD/PA would be contacting him about his father's nutrition/feeding.  He expressed understanding.  CSW confirmed with son that plan was still for SNF and told him that CSW spoke with pt about this.  CSW left list of SNFs in [place] and [place] for son in pt's room.  CSW noted that PEG is now scheduled for 08-18-13 and so CSW will pursue SNF after that time with son's requested facilities.

## 2013-08-12 NOTE — Progress Notes (Signed)
Physical Therapy Session Note  Patient Details  Name: Keith [name] MRN: [id number] Date of Birth: 08-11-1946  Today's Date: 08/12/2013 Time: 4696-2952 Time Calculation (min): 43 min  Short Term Goals: Week 1:  PT Short Term Goal 1 (Week 1): Pt will perform all aspects of bed mobility with HOB flat and without handrails at min assist consistently and safely PT Short Term Goal 2 (Week 1): Pt will perform stand pivot transfers with min assist to different level surfaces.  PT Short Term Goal 3 (Week 1): Pt will perform dynamic standing balance at min assist 5 mins at a time to increase balance and activity tolerance.  PT Short Term Goal 4 (Week 1): Pt will ambulate x 100' w/ LRAD at min assist  PT Short Term Goal 5 (Week 1): Pt will ascend/descend 3 stairs with B handrails at min assist  Skilled Therapeutic Interventions/Progress Updates:   Pt received sitting in w/c in room and agreeable to therapy this afternoon.  Had pt self propel w/c x 155' to gym using BLEs and BUEs in order to increase overall strength, endurance and work on increased coordination with reciprocal patterns.  On way to gym, pt with c/o pain and tightness in neck and shoulders and stating that he had received traction like services that helped with previously diagnosed "slipped disc."  Assisted pt via stand pivot w/c <> mat table at min assist level with cues for increased forward weight shift and getting all the way to seating surface prior to sitting.  Performed supine to/from sit at supervision level with cues for safety and technique.  Once in supine, performed manual stretching to bilateral upper trap and levator musculature x 30 secs x 2 reps each, progressing to SCM stretch x 2 reps x 30 secs each.  Then performed manual distraction at pts mastoid body structures initially with clinicians padded surface of fingers, transitioning to cervical traction with use of therapist forearm performing pronation.  Performed each x 30 secs  each.  Performed supine pectoral stretch with arms at 90 deg abd x 30 secs each.  Transitioned to performing high level balance with bimanual task of catching ball from varying directions.  Continues to demonstrate overt LOB posteriorly and requires mod assist at times to correct, however did note improved hip strategy during activity.  Pt self propelled using BLEs only back to room in order to increased BLE strength and increase reciprocal coordination.  Left pt in wc in room with quick release belt in place and all needs in reach.    Therapy Documentation Precautions:  Precautions Precautions: Fall Precaution Comments: NPO, NG feeding tube in place, ataxia Restrictions Weight Bearing Restrictions: No Vital Signs: Therapy Vitals Temp: 97.3 F (36.3 C) Temp src: Oral Pulse Rate: 66 Resp: 18 BP: 149/76 mmHg Patient Position, if appropriate: Sitting Oxygen Therapy SpO2: 98 % O2 Device: None (Room air) Pain: Pain Assessment Pain Assessment: No/denies pain    Balance: Balance Balance Assessed: Yes  See FIM for current functional status  Therapy/Group: Individual Therapy  Vista Deck 08/12/2013, 2:16 PM

## 2013-08-12 NOTE — Progress Notes (Signed)
Spoke with interventional radiology on plan for PEG tube. Recommend hold Plavix therapy with anticipated PEG tube placement Wednesday, 08/18/2013

## 2013-08-12 NOTE — Progress Notes (Signed)
Speech Language Pathology Daily Session Note  Patient Details  Name: Keith [name] MRN: [id number] Date of Birth: 10-May-1946  Today's Date: 08/12/2013 Time: 1005-1045 Time Calculation (min): 40 min  Short Term Goals: Week 1: SLP Short Term Goal 1 (Week 1): Pt will perform pharyngeal strengthening exercises with Mod cues SLP Short Term Goal 2 (Week 1): Pt will initiate a volitional pharyngeal swallow x10 with Min cues SLP Short Term Goal 3 (Week 1): Pt will utilize speech intelligibility strategies with Min cues SLP Short Term Goal 4 (Week 1): Pt will perform oral motor exercises with Min cues  Skilled Therapeutic Interventions: Skilled treatment session focused on addressing dysphagia and self-care goals.  Patient reported completing oral care in previous session.  SLP facilitated session with Supervision level verbal cues to recall and perform previously taught pharyngeal strengthening exercises; patient reported doing them and that his throat was sore because he has been swallowing so much.  SLP facilitated session with trials of puree with Mod faded to Supervision level verbal cues to utilize 3 swallows, a throat clear and an additional swallow.  Patient reported intermittent sensation of something being in his throat and with additional hard swallows reported feeling as though it had cleared.  Max multi-modal cues for "hut" to address glottis closure were effective in 1/10 opportunities today; patient able to self-evaluate performance better after effective trial today.           FIM:  Comprehension Comprehension Mode: Auditory Comprehension: 5-Follows basic conversation/direction: With extra time/assistive device Expression Expression Mode: Verbal Expression: 4-Expresses basic 75 - 89% of the time/requires cueing 10 - 24% of the time. Needs helper to occlude trach/needs to repeat words. Social Interaction Social Interaction: 5-Interacts appropriately 90% of the time - Needs monitoring or  encouragement for participation or interaction. Problem Solving Problem Solving: 5-Solves basic 90% of the time/requires cueing < 10% of the time Memory Memory: 5-Recognizes or recalls 90% of the time/requires cueing < 10% of the time FIM - Eating Eating Activity: 5: Set-up assist for open containers;4: Helper occasionally scoops food on utensil;5: Needs verbal cues/supervision  Pain Pain Assessment Pain Assessment: No/denies pain  Therapy/Group: Individual Therapy  Charlane Ferretti., CCC-SLP [phone]  Sanae Willetts 08/12/2013, 12:05 PM

## 2013-08-12 NOTE — Progress Notes (Signed)
Social Work Patient ID: Keith [name], male   DOB: [birth year], 67 y.o.   MRN: [id number]   Keith Deck Porschea Borys, LCSW Social Worker Signed  Patient Care Conference Service date: 08/12/2013 1:38 PM  Inpatient RehabilitationTeam Conference and Plan of Care Update Date: 08/11/2013   Time: 11:30 AM     Patient Name: Keith [name]       Medical Record Number: [id number]   Date of Birth: 10/07/1945 Sex: Male         Room/Bed: 4W26C/4W26C-01 Payor Info: Payor: MEDICARE / Plan: MEDICARE PART A AND B / Product Type: *No Product type* /   Admitting Diagnosis: B CVA   Admit Date/Time:  08/05/2013  4:32 PM Admission Comments: No comment available   Primary Diagnosis:  <principal problem not specified> Principal Problem: <principal problem not specified>    Patient Active Problem List     Diagnosis  Date Noted   .  Dysarthria due to cerebrovascular accident  08/05/2013   .  Weakness  08/05/2013   .  Type II or unspecified type diabetes mellitus without mention of complication, not stated as uncontrolled  08/05/2013   .  Essential hypertension, benign  08/05/2013   .  Depression with anxiety  08/05/2013   .  Alcohol abuse  08/05/2013   .  Facial droop  07/31/2013   .  CVA (cerebral infarction)  07/31/2013     Expected Discharge Date: pt will transfer to SNF  Team Members Present: Physician leading conference: Dr. Claudette Laws Social Worker Present: Dossie Der, LCSW;Jenny Floetta Brickey, LCSW Nurse Present: Other (comment) Berta Minor, RN) PT Present: Harriet Butte, PT OT Present: Bretta Bang, OT;Sarah Hoxie, Heath Lark, OT SLP Present: Fae Pippin, SLP PPS Coordinator present : Tora Duck, RN, CRRN;Becky Henrene Dodge, PT        Current Status/Progress  Goal  Weekly Team Focus   Medical     NPO, poor result on repeat MBS  establish reliable route for nutrition  cont swallow retraining and consider PEG   Bowel/Bladder     cont of bowel and bladder; min assist with emptying of  urinal  cont of bowel and bladder with min assist  monitor for constipation/diarrhea   Swallow/Nutrition/ Hydration     NPO    trials of puree and pharyngeal strengthening with SLP   ADL's     Pt is currenlty supervison for UB bathing and min guard assist for LB bathing and dressing,  he needs min assist for UB dressing to fasten his shirt buttons.  Pt needs min assist for transfers to the toilet using the RW.  He demonstrates mild ataxia in the LUE and trunk.  Goals are overall supervison level for bathing, dressing, toileting.     Mobility     Pt currently requires min assist for bed mobility, min assist for standing, mod assist for higher level balance, min assist for gait with RW, mod assist without AD, min assist for stair negotiation.  continues to have increased ataxia with gait, however is improved since eval.   supervision overall  NMR for LUE/LE, high level balance, ankle/hip stategy, transfers, bed mobility, gait, stairs, pt/family education,    Communication     Mod assist   Supervision      Safety/Cognition/ Behavioral Observations    Supervision   Mod assist   increase use of externl aids   Pain     prn tramadol and tyelonol for headache as needed  pain level less than 3/10  asses pain  qshift and prn, medicate as needed   Skin     No skin issues  inspect skin daily for s/s of breakdown      Rehab Goals Patient on target to meet rehab goals: Yes Rehab Goals Revised: None *See Care Plan and progress notes for long and short-term goals.    Barriers to Discharge:  see above      Possible Resolutions to Barriers:    see above      Discharge Planning/Teaching Needs:    Pt does not have a 24/7 caregiver.  He will need SNF after his stay on [facility].   None at this time.    Team Discussion:    Pt is having a hard time remembering exercises SLP is teaching him, so he is not able to carryover to the next session.  Pt did poorly on MBS and team discussed the need for a PEG.  MD  will discuss with pt.  Pt is using a walker with PT and is min assist with OT.    Revisions to Treatment Plan:    Pt will need 24/7 supervision with some minimal assistance and he does not have anyone who can provide that care. CSW will pursue SNF placement when pt is medically stable.      Continued Need for Acute Rehabilitation Level of Care: The patient requires daily medical management by a physician with specialized training in physical medicine and rehabilitation for the following conditions: Daily direction of a multidisciplinary physical rehabilitation program to ensure safe treatment while eliciting the highest outcome that is of practical value to the patient.: Yes Daily medical management of patient stability for increased activity during participation in an intensive rehabilitation regime.: Yes Daily analysis of laboratory values and/or radiology reports with any subsequent need for medication adjustment of medical intervention for : Neurological problems  Lavanda Nevels, Keith Deck 08/12/2013, 1:39 PM

## 2013-08-12 NOTE — Progress Notes (Signed)
IR PA aware of image guided percutaneous gastric tube request. Patient is on plavix daily. I have spoke with the ordering PA and plavix will be held 5-7 days and plan for gastric tube workup Tuesday of next week for 12/17 Wednesday procedure date.   Pattricia Boss PA-C Interventional Radiology  08/12/13  9:56 AM

## 2013-08-13 ENCOUNTER — Inpatient Hospital Stay (HOSPITAL_COMMUNITY): Payer: Medicare Other | Admitting: Rehabilitation

## 2013-08-13 ENCOUNTER — Inpatient Hospital Stay (HOSPITAL_COMMUNITY): Payer: Medicare Other | Admitting: Speech Pathology

## 2013-08-13 ENCOUNTER — Inpatient Hospital Stay (HOSPITAL_COMMUNITY): Payer: Medicare Other | Admitting: *Deleted

## 2013-08-13 LAB — GLUCOSE, CAPILLARY
Glucose-Capillary: 164 mg/dL — ABNORMAL HIGH (ref 70–99)
Glucose-Capillary: 192 mg/dL — ABNORMAL HIGH (ref 70–99)
Glucose-Capillary: 218 mg/dL — ABNORMAL HIGH (ref 70–99)
Glucose-Capillary: 257 mg/dL — ABNORMAL HIGH (ref 70–99)
Glucose-Capillary: 260 mg/dL — ABNORMAL HIGH (ref 70–99)

## 2013-08-13 MED ORDER — BISACODYL 10 MG RE SUPP
10.0000 mg | Freq: Every day | RECTAL | Status: DC | PRN
  Administered 2013-08-13: 10 mg via RECTAL
  Filled 2013-08-13: qty 1

## 2013-08-13 NOTE — Progress Notes (Signed)
Physical Therapy Session Note  Patient Details  Name: Keith [name] MRN: [id number] Date of Birth: 1946/03/15  Today's Date: 08/13/2013 Time: 1355-1430 Time Calculation (min): 35 min  Short Term Goals: Week 1:  PT Short Term Goal 1 (Week 1): Pt will perform all aspects of bed mobility with HOB flat and without handrails at min assist consistently and safely PT Short Term Goal 2 (Week 1): Pt will perform stand pivot transfers with min assist to different level surfaces.  PT Short Term Goal 3 (Week 1): Pt will perform dynamic standing balance at min assist 5 mins at a time to increase balance and activity tolerance.  PT Short Term Goal 4 (Week 1): Pt will ambulate x 100' w/ LRAD at min assist  PT Short Term Goal 5 (Week 1): Pt will ascend/descend 3 stairs with B handrails at min assist  Skilled Therapeutic Interventions/Progress Updates:   Pt received sitting in w/c in room and agreeable to therapy.  Ambulated to/from therapy gym (>150' x 2) at min assist level without RW to continue to challenge balance with continued cues for increased step width, increased stride length, and decreased gait speed when making turns for increased safety.  Once in gym, performed standing exercise tapping to 4" step in order to address balance and coordination deficits.  Requires min assist mostly, however had LOB x 2 and required mod assist to correct, however pt did demonstrate some stepping strategy during task.  Progressed to performing seated balance on physioball to address trunk ataxia/balance deficits while performing bimanual task of opening medicine bottles (unscrewing with LUE).  Pt was able to maintain balance on physioball at supervision level, and had only very minimal difficulty opening bottles.  Ended session with side stepping in hallway using handrail in hallway with BUE support initially, progressing to single UE support to increased WB and weight shifting to LLE and increase hip abd strength.  Max cues  for maintaining LE in proper alignment during activity.  Assisted pt back to room as mentioned above and left pt in recliner for increased back support with all needs in reach.  Note he is doing well without safety belt.    Therapy Documentation Precautions:  Precautions Precautions: Fall Precaution Comments: NPO, NG feeding tube in place, ataxia Restrictions Weight Bearing Restrictions: No General: Amount of Missed PT Time (min): 10 Minutes Missed Time Reason: Other (comment) (PT late from last appt)   Pain: Pain Assessment Pain Assessment: No/denies pain  See FIM for current functional status  Therapy/Group: Individual Therapy  Vista Deck 08/13/2013, 3:28 PM

## 2013-08-13 NOTE — Progress Notes (Signed)
Physical Therapy Session Note  Patient Details  Name: Keith [name] MRN: [id number] Date of Birth: 1946-07-23  Today's Date: 08/13/2013 Time:  -     Short Term Goals: Week 1:  PT Short Term Goal 1 (Week 1): Pt will perform all aspects of bed mobility with HOB flat and without handrails at min assist consistently and safely PT Short Term Goal 2 (Week 1): Pt will perform stand pivot transfers with min assist to different level surfaces.  PT Short Term Goal 3 (Week 1): Pt will perform dynamic standing balance at min assist 5 mins at a time to increase balance and activity tolerance.  PT Short Term Goal 4 (Week 1): Pt will ambulate x 100' w/ LRAD at min assist  PT Short Term Goal 5 (Week 1): Pt will ascend/descend 3 stairs with B handrails at min assist  Skilled Therapeutic Interventions/Progress Updates:   Pt received sitting in w/c in room and agreeable to therapy this morning.  Assisted with disconnecting NG tube for ease of movement during session.  Had pt doff and don old/new pair of socks in order to don shoes that family brought in yesterday.  Assisted with tying pts shoes.  Had pt self propel >150' to/from gym at supervision level with BLEs only for increased BLEs strength and to address coordination with reciprocal pattern.  Note pt with much improved technique with shoes vs yesterday without.  Once in gym had pt stand on small balance board while reaching forward and upwards for resistance clothes pins with LUE.  Performed activity in order to address ankle/hip strategy and also to work on fine motor skills with grip strength and coordination when placing pins.  Pt able to perform at min/guard assist and did well with cues making small adjustments at hips.  Also was able to grasp and place yellow, green and blue pins well, however did have some difficulty with black pins.  Transitioned to performing balance activities on biodex in order to continue addressing ankle/hip strategy with  forwards/backwards and semi tandem weight shifts.  Initiated activity with use of BUE support and transitioned to no UE support at min/guard assist, more for manual facilitation for more accurate weight shift.  Continue to note ataxic movement in trunk and LEs during activity.  Performed seated nustep with BLEs only with level 4 resistance (started with level 5, however was having increased knee pain) x 6 mins to increase WB through LLE to decrease ataxia and strengthen BLEs.  Ended session with ambulation without AD and without weighted belt to further assess balance and ataxia.  Note marked improvement in gait pattern and requires min assist without RW.  Pt self propelled as stated earlier to room.  Left pt in room without quick release belt (trialing pt without belt during day to determine safety, safety plan updated.) All needs in reach.   Therapy Documentation Precautions:  Precautions Precautions: Fall Precaution Comments: NPO, NG feeding tube in place, ataxia Restrictions Weight Bearing Restrictions: No   Vital Signs: Therapy Vitals Temp: 97.7 F (36.5 C) Temp src: Oral Pulse Rate: 63 Resp: 19 BP: 125/55 mmHg Patient Position, if appropriate: Lying Oxygen Therapy SpO2: 95 % O2 Device: None (Room air) Pain: Pain Assessment Pain Assessment: 0-10 Pain Score: 5  Pain Type: Acute pain Pain Location: Leg Pain Orientation: Right;Left Pain Descriptors / Indicators: Aching Pain Frequency: Occasional Pain Onset: Gradual Patients Stated Pain Goal: 0 Pain Intervention(s): Medication (See eMAR) Multiple Pain Sites: No See FIM for current functional status  Therapy/Group: Individual Therapy  Vista Deck 08/13/2013, 9:34 AM

## 2013-08-13 NOTE — Progress Notes (Signed)
Inpatient Diabetes Program Recommendations  AACE/ADA: New Consensus Statement on Inpatient Glycemic Control (2013)  Target Ranges:  Prepandial:   less than 140 mg/dL      Peak postprandial:   less than 180 mg/dL (1-2 hours)      Critically ill patients:  140 - 180 mg/dL   Reason for Visit: Hyperglycemia Results for SAYED, APOSTOL (MRN [id number]) as of 08/13/2013 15:29  Ref. Range 08/12/2013 19:48 08/13/2013 00:21 08/13/2013 03:51 08/13/2013 07:44 08/13/2013 11:07  Glucose-Capillary Latest Range: 70-99 mg/dL 409 (H) 811 (H) 914 (H) 218 (H) 260 (H)  Results for RANALD, ALESSIO (MRN [id number]) as of 08/13/2013 15:29  Ref. Range 08/01/2013 06:24  Hemoglobin A1C Latest Range: <5.7 % 7.4 (H)  Results for BRYTEN, MAHER (MRN [id number]) as of 08/13/2013 15:29  Ref. Range 08/06/2013 05:45  Sodium Latest Range: 135-145 mEq/L 137  Potassium Latest Range: 3.5-5.1 mEq/L 3.6  Chloride Latest Range: 96-112 mEq/L 102  CO2 Latest Range: 19-32 mEq/L 27  BUN Latest Range: 6-23 mg/dL 9  Creatinine Latest Range: 0.50-1.35 mg/dL 6.29  Calcium Latest Range: 8.4-10.5 mg/dL 8.5  GFR calc non Af Amer Latest Range: >90 mL/min >90  GFR calc Af Amer Latest Range: >90 mL/min >90  Glucose Latest Range: 70-99 mg/dL 528 (H)     Inpatient Diabetes Program Recommendations Insulin - Meal Coverage: Consider addition of TF coverage - Novolog 3 units Q4 hours  Note: Will follow while inpatient. Thank you. Ailene Ards, RD, LDN, CDE Inpatient Diabetes Coordinator [phone]

## 2013-08-13 NOTE — Progress Notes (Signed)
Occupational Therapy Note Patient Details  Name: Keith [name] MRN: [id number] Date of Birth: 1946-07-20 Today's Date: 08/13/2013  Time:1115-1215 Pain:  none Individual session   Pt. Sitting in wc upon OT arrival.  Ambulated to sink to bathe.  Sat in wc to doff shirt anf unbutton.  Pt used LUE to wash and unbutton.  He had minimal ataxia when moving LUE.  Maintained good sustatined attention during task and needed no help with organizing and sequencing.  Did sit to stand with SBA.  Pt. Able to reach lumbar with LUE to wash back.  Stood at sink for 6 minutes while shaving.  Sat last 5 minutes to shave with electric razor.   Pt. Had difficulty buttoning buttons due to blurry vision.  Left in wc with call bell in reach.  Is having trials of not using safety belt.     [name] 08/13/2013, 11:33 AM

## 2013-08-13 NOTE — Progress Notes (Signed)
67 y.o. male with history of DM, HTN, who was admitted on 07/31/13 with left facial droop with dysarthria and one week history of imbalance as diplopia X 2 days. MRI/MRA brain done revealing two small acute brainstem infarcts in-right paracentral pons, and central to left paracentral medulla possibly involving left pyramid, no stenosis. 2D echo with EF 55-60% and grade 1 diastolic dysfunction. Carotid dopplers with 1-39% ICA stenosis. Neurology services consulted maintained on aspirin and Plavix therapy for CVA prophylaxis. Subcutaneous heparin added for DVT prophylaxis.Marland Kitchen MBS done 08/01/13 alternative nutritional means and NPO recommended    Subjective/Complaints: No SOB, no CP MBS 12/9 still NPO Occ cough  Discussed expected duration of recovery for both swallow and mobility Per IR needs to be off plavix until next Wed Review of Systems - otherwise negative   Objective: Vital Signs: Blood pressure 125/55, pulse 63, temperature 97.7 F (36.5 C), temperature source Oral, resp. rate 19, weight 77.6 kg (171 lb 1.2 oz), SpO2 95.00%. No results found. Results for orders placed during the hospital encounter of 08/05/13 (from the past 72 hour(s))  GLUCOSE, CAPILLARY     Status: Abnormal   Collection Time    08/10/13 11:58 AM      Result Value Range   Glucose-Capillary 153 (*) 70 - 99 mg/dL   Comment 1 Notify RN    GLUCOSE, CAPILLARY     Status: Abnormal   Collection Time    08/10/13  5:04 PM      Result Value Range   Glucose-Capillary 169 (*) 70 - 99 mg/dL   Comment 1 Notify RN    GLUCOSE, CAPILLARY     Status: Abnormal   Collection Time    08/10/13  8:41 PM      Result Value Range   Glucose-Capillary 174 (*) 70 - 99 mg/dL  GLUCOSE, CAPILLARY     Status: Abnormal   Collection Time    08/10/13 11:39 PM      Result Value Range   Glucose-Capillary 175 (*) 70 - 99 mg/dL  GLUCOSE, CAPILLARY     Status: Abnormal   Collection Time    08/11/13  4:16 AM      Result Value Range   Glucose-Capillary 178 (*) 70 - 99 mg/dL  GLUCOSE, CAPILLARY     Status: Abnormal   Collection Time    08/11/13  7:24 AM      Result Value Range   Glucose-Capillary 179 (*) 70 - 99 mg/dL   Comment 1 Notify RN    GLUCOSE, CAPILLARY     Status: Abnormal   Collection Time    08/11/13 11:30 AM      Result Value Range   Glucose-Capillary 158 (*) 70 - 99 mg/dL   Comment 1 Notify RN    GLUCOSE, CAPILLARY     Status: Abnormal   Collection Time    08/11/13  4:22 PM      Result Value Range   Glucose-Capillary 148 (*) 70 - 99 mg/dL   Comment 1 Notify RN    GLUCOSE, CAPILLARY     Status: Abnormal   Collection Time    08/11/13  7:44 PM      Result Value Range   Glucose-Capillary 152 (*) 70 - 99 mg/dL  GLUCOSE, CAPILLARY     Status: Abnormal   Collection Time    08/11/13 11:54 PM      Result Value Range   Glucose-Capillary 169 (*) 70 - 99 mg/dL  GLUCOSE, CAPILLARY     Status:  Abnormal   Collection Time    08/12/13  3:41 AM      Result Value Range   Glucose-Capillary 166 (*) 70 - 99 mg/dL  GLUCOSE, CAPILLARY     Status: Abnormal   Collection Time    08/12/13  7:16 AM      Result Value Range   Glucose-Capillary 156 (*) 70 - 99 mg/dL   Comment 1 Notify RN    GLUCOSE, CAPILLARY     Status: Abnormal   Collection Time    08/12/13 11:35 AM      Result Value Range   Glucose-Capillary 148 (*) 70 - 99 mg/dL   Comment 1 Notify RN    GLUCOSE, CAPILLARY     Status: Abnormal   Collection Time    08/12/13  4:25 PM      Result Value Range   Glucose-Capillary 169 (*) 70 - 99 mg/dL   Comment 1 Notify RN    GLUCOSE, CAPILLARY     Status: Abnormal   Collection Time    08/12/13  7:48 PM      Result Value Range   Glucose-Capillary 210 (*) 70 - 99 mg/dL  GLUCOSE, CAPILLARY     Status: Abnormal   Collection Time    08/13/13 12:21 AM      Result Value Range   Glucose-Capillary 192 (*) 70 - 99 mg/dL  GLUCOSE, CAPILLARY     Status: Abnormal   Collection Time    08/13/13  3:51 AM      Result  Value Range   Glucose-Capillary 164 (*) 70 - 99 mg/dL     HEENT: normal Cardio: RRR and no murmur Resp: CTA B/L and unlabored GI: BS positive and non distended Extremity:  Pulses positive and No Edema Skin:   Intact Neuro: a little slow this am, Cranial Nerve Abnormalities Left central 7, Abnormal Motor 3-/5 LUE, 3-/5 LLE, 5/5 on  Right side, Abnormal FMC Ataxic/ dec FMC and Dysarthric. Severe dymetria left FNF and Left Heel to Shin Musc/Skel:  Normal GEN: NAD   Assessment/Plan: 1. Functional deficits secondary to Right pontine and Left medullary infarcts which require 3+ hours per day of interdisciplinary therapy in a comprehensive inpatient rehab setting. Physiatrist is providing close team supervision and 24 hour management of active medical problems listed below. Physiatrist and rehab team continue to assess barriers to discharge/monitor patient progress toward functional and medical goals. FIM: FIM - Bathing Bathing Steps Patient Completed: Chest;Right Arm;Left Arm;Abdomen;Front perineal area;Buttocks;Right upper leg;Left upper leg;Right lower leg (including foot);Left lower leg (including foot) Bathing: 4: Steadying assist  FIM - Upper Body Dressing/Undressing Upper body dressing/undressing steps patient completed: Thread/unthread right sleeve of pullover shirt/dresss;Thread/unthread left sleeve of pullover shirt/dress;Put head through opening of pull over shirt/dress;Pull shirt over trunk;Thread/unthread right sleeve of front closure shirt/dress;Thread/unthread left sleeve of front closure shirt/dress;Pull shirt around back of front closure shirt/dress;Button/unbutton shirt Upper body dressing/undressing: 5: Supervision: Safety issues/verbal cues (seated) FIM - Lower Body Dressing/Undressing Lower body dressing/undressing steps patient completed: Thread/unthread right underwear leg;Thread/unthread left underwear leg;Pull underwear up/down;Thread/unthread right pants  leg;Thread/unthread left pants leg;Pull pants up/down;Fasten/unfasten pants;Don/Doff right sock;Don/Doff left sock Lower body dressing/undressing: 5: Supervision: Safety issues/verbal cues  FIM - Toileting Toileting steps completed by patient: Adjust clothing prior to toileting;Performs perineal hygiene;Adjust clothing after toileting Toileting Assistive Devices: Grab bar or rail for support Toileting: 4: Steadying assist  FIM - Diplomatic Services operational officer Devices: Grab bars Toilet Transfers: 4-To toilet/BSC: Min A (steadying Pt. > 75%);4-From toilet/BSC:  Min A (steadying Pt. > 75%)  FIM - Bed/Chair Transfer Bed/Chair Transfer Assistive Devices: Arm rests Bed/Chair Transfer: 4: Bed > Chair or W/C: Min A (steadying Pt. > 75%);4: Chair or W/C > Bed: Min A (steadying Pt. > 75%);5: Supine > Sit: Supervision (verbal cues/safety issues);5: Sit > Supine: Supervision (verbal cues/safety issues)  FIM - Locomotion: Wheelchair Distance: 155 Locomotion: Wheelchair: 5: Travels 150 ft or more: maneuvers on rugs and over door sills with supervision, cueing or coaxing FIM - Locomotion: Ambulation Locomotion: Ambulation Assistive Devices:  (no AD) Ambulation/Gait Assistance: 4: Min assist;3: Mod assist Locomotion: Ambulation: 3: Travels 150 ft or more with moderate assistance (Pt: 50 - 74%)  Comprehension Comprehension Mode: Auditory Comprehension: 5-Follows basic conversation/direction: With extra time/assistive device  Expression Expression Mode: Verbal Expression: 5-Expresses basic needs/ideas: With extra time/assistive device  Social Interaction Social Interaction: 5-Interacts appropriately 90% of the time - Needs monitoring or encouragement for participation or interaction.  Problem Solving Problem Solving: 5-Solves basic 90% of the time/requires cueing < 10% of the time  Memory Memory: 5-Recognizes or recalls 90% of the time/requires cueing < 10% of the time  Medical  Problem List and Plan:  1. Thrombotic left medullary, right paracentral pontine infarct  2. DVT Prophylaxis/Anticoagulation: Subcutaneous heparin. Monitor platelet counts and any signs of bleeding  3. Pain Management: Tylenol as needed for headache--this has been effective 4. Mood: Zoloft 50 mg daily, Ativan 0.5 mg every 4 as needed anxiety  5. Neuropsych: This patient is capable of making decisions on his own behalf.  6. Dysphagia. Patient currently n.p.o. with nasogastric tube feeds.  Will need PEG, pt in agreement will need to touch base with son as well.  IR can do this on Wed 12/17 7. Hypertension. Norvasc 5 mg daily.  Borderline controlled at present  8. Hyperlipidemia. Lipitor  9. Diabetes mellitus with peripheral neuropathy. controlledHemoglobin A1c 7.4. Continue sliding scale insulin. Patient on Glucophage 500 mg twice a day prior to admission. Blood sugars have been consistently in the 150-170 range.     LOS (Days) 8 A FACE TO FACE EVALUATION WAS PERFORMED  Corianna Avallone E 08/13/2013, 7:48 AM

## 2013-08-13 NOTE — Progress Notes (Signed)
Speech Language Pathology Weekly Progress Note & Daily Session  Patient Details  Name: Keith [name] MRN: [id number] Date of Birth: 1945/12/08  Today's Date: 08/13/2013 Time: 4098-1191 Time Calculation (min): 30 min  Short Term Goals: Week 1: SLP Short Term Goal 1 (Week 1): Pt will perform pharyngeal strengthening exercises with Mod cues SLP Short Term Goal 1 - Progress (Week 1): Met SLP Short Term Goal 2 (Week 1): Pt will initiate a volitional pharyngeal swallow x10 with Min cues SLP Short Term Goal 2 - Progress (Week 1): Met SLP Short Term Goal 3 (Week 1): Pt will utilize speech intelligibility strategies with Min cues SLP Short Term Goal 3 - Progress (Week 1): Met SLP Short Term Goal 4 (Week 1): Pt will perform oral motor exercises with Min cues SLP Short Term Goal 4 - Progress (Week 1): Progressing toward goal Week 2: SLP Short Term Goal 1 (Week 2): Pt will perform pharyngeal strengthening exercises with Min cues SLP Short Term Goal 2 (Week 2): Pt will consume 4oz of puree with Min verbal cues to utilite recommended safe swallow strategeis  SLP Short Term Goal 3 (Week 2): Pt will utilize speech intelligibility strategies with Supervision level verbal cues SLP Short Term Goal 4 (Week 2): Pt will perform oral motor exercises with Min cues  Weekly Progress Updates: Patient met 3 out of 4 short term goals this reporting period due to gains in ability to recall and utilize pharyngeal strengthening exercises, speech intelligibility strategies and achieve volitional swallows x10 during a structured session.  While, his goal for performance for oral motor exercises has not been met it is due to the focus of therapy sessions being on pharyngeal strengthening with long term goals for p.o. nutrition.  As a result, this goal will be addressing in the next reporting period.  Given continued bulbar deficits impacting both speech and swallowing as well as current NPO status this patient continues to  require skilled SLP services to address these deficits and maximize function prior to discharge.    SLP Intensity: Minumum of 1-2 x/day, 30 to 90 minutes SLP Frequency: 5 out of 7 days SLP Duration/Estimated Length of Stay: SNF SLP Treatment/Interventions: Cueing hierarchy;Dysphagia/aspiration precaution training;Oral motor exercises;Patient/family education;Speech/Language facilitation;Therapeutic Exercise;Therapeutic Activities  Daily Session  Skilled Intervention: Skilled treatment session focused on addressing dysphagia.  RN present for nursing care and medication administration and session started 15 minutes late.  SLP facilitated session with 2 oz. of puree with Mod faded to Supervision level verbal cues to utilize 3 swallows, a throat clear and an additional swallow.  Patient reported decreased instances of residue today, and demonstrated a decreased in rested needed between volitional swallows.  Max multi-modal cues for "hut" to address glottis closure were effective in 1/10 opportunities today.  Continue with plan of care.  FIM:  Comprehension Comprehension Mode: Auditory Comprehension: 5-Follows basic conversation/direction: With extra time/assistive device Expression Expression Mode: Verbal Expression: 4-Expresses basic 75 - 89% of the time/requires cueing 10 - 24% of the time. Needs helper to occlude trach/needs to repeat words. Social Interaction Social Interaction: 5-Interacts appropriately 90% of the time - Needs monitoring or encouragement for participation or interaction. Problem Solving Problem Solving: 5-Solves basic 90% of the time/requires cueing < 10% of the time Memory Memory: 5-Recognizes or recalls 90% of the time/requires cueing < 10% of the time FIM - Eating Eating Activity: 5: Set-up assist for open containers;4: Helper occasionally scoops food on utensil;5: Needs verbal cues/supervision General  Amount of Missed SLP Time (  min): 15 Minutes Missed Time Reason:  Nursing care Pain Pain Assessment Pain Assessment: 0-10 Pain Score: 5  Pain Type: Acute pain Pain Location: Leg Pain Orientation: Right;Left Pain Descriptors / Indicators: Aching Pain Frequency: Occasional Pain Onset: Gradual Patients Stated Pain Goal: 0 Pain Intervention(s): Medication (See eMAR) Multiple Pain Sites: No  Therapy/Group: Individual Therapy  Keith [name]., CCC-SLP [phone]  Keith [name] 08/13/2013, 10:28 AM

## 2013-08-14 ENCOUNTER — Inpatient Hospital Stay (HOSPITAL_COMMUNITY): Payer: Medicare Other | Admitting: Speech Pathology

## 2013-08-14 ENCOUNTER — Inpatient Hospital Stay (HOSPITAL_COMMUNITY): Payer: Medicare Other | Admitting: Physical Therapy

## 2013-08-14 LAB — GLUCOSE, CAPILLARY
Glucose-Capillary: 250 mg/dL — ABNORMAL HIGH (ref 70–99)
Glucose-Capillary: 177 mg/dL — ABNORMAL HIGH (ref 70–99)

## 2013-08-14 MED ORDER — METFORMIN HCL 500 MG PO TABS
500.0000 mg | ORAL_TABLET | Freq: Two times a day (BID) | ORAL | Status: DC
  Administered 2013-08-14 – 2013-08-20 (×9): 500 mg via ORAL
  Filled 2013-08-14 (×14): qty 1

## 2013-08-14 NOTE — Progress Notes (Signed)
Physical Therapy Note  Patient Details  Name: Keith [name] MRN: [id number] Date of Birth: 12-16-1945 Today's Date: 08/14/2013  1500-1540 (40 minutes) individual Pain: no reported pain Focus of treatment: gait training (decrease ataxia) Treatment: Pt in wc upon arrival ; pt propels wc using bilateral UE/LEs on unit with SBA 150+ feet level surfaces; gait 120 feet RW min assist for safety especially when changing directions with improving foot placement  during gait ;gait without AD min assist 150 feet ; stepping to targets on floor focusing on side and tandem stepping with increased posterior loss of balance; Kinetron in standing with/without UE support (min assist ) focusing on weight shifts sideways and maintaining balance;returned to room with all needs within reach.    Gabryela Kimbrell,JIM 08/14/2013, 3:28 PM

## 2013-08-14 NOTE — Progress Notes (Signed)
Patient last bowel movement 12/9, patient states feels constipated. Patient was given sorbitol by previous nurse with no results. Notified Kirsteins, MD given order for dulcolax suppository. Will continue to monitor patient.

## 2013-08-14 NOTE — Plan of Care (Signed)
Problem: RH PAIN MANAGEMENT Goal: RH STG PAIN MANAGED AT OR BELOW PT'S PAIN GOAL 3 or less on scale 0-10  Outcome: Progressing PRN ultram 50mg  effective for headache

## 2013-08-14 NOTE — Progress Notes (Signed)
Speech Language Pathology Daily Session Note  Patient Details  Name: Keith [name] MRN: [id number] Date of Birth: 10-03-1945  Today's Date: 08/14/2013 Time: 1005-1035 Time Calculation (min): 30 min  Short Term Goals: Week 1: SLP Short Term Goal 1 (Week 1): Pt will perform pharyngeal strengthening exercises with Mod cues SLP Short Term Goal 1 - Progress (Week 1): Met SLP Short Term Goal 2 (Week 1): Pt will initiate a volitional pharyngeal swallow x10 with Min cues SLP Short Term Goal 2 - Progress (Week 1): Met SLP Short Term Goal 3 (Week 1): Pt will utilize speech intelligibility strategies with Min cues SLP Short Term Goal 3 - Progress (Week 1): Met SLP Short Term Goal 4 (Week 1): Pt will perform oral motor exercises with Min cues SLP Short Term Goal 4 - Progress (Week 1): Progressing toward goal  Skilled Therapeutic Interventions: Therapeutic intervention complete with short term goals addressed.  The patient participated in pharyngeal strengthening exercises with Min A multimodal cueing.  He received trials of puree with Min A verbal cues to use compensatory strategies of 3 swallows-throat clear- and 1 swallow.  Continue with current treatment plan.     FIM:  Comprehension Comprehension Mode: Auditory Expression Expression Mode: Verbal Expression: 5-Expresses basic needs/ideas: With extra time/assistive device FIM - Eating Eating Activity: 1: Helper feeds patient  Pain Pain Assessment Pain Assessment: No/denies pain Pain Score: 0-No pain Pain Type: Acute pain Pain Location: Head Pain Descriptors / Indicators: Headache Pain Frequency: Occasional Pain Onset: Gradual Patients Stated Pain Goal: 2 Pain Intervention(s): Medication (See eMAR)  Therapy/Group: Individual Therapy  Lenny Pastel 08/14/2013, 10:58 AM

## 2013-08-14 NOTE — Plan of Care (Signed)
Problem: RH BOWEL ELIMINATION Goal: RH STG MANAGE BOWEL W/MEDICATION W/ASSISTANCE STG Manage Bowel with Medication with min Assistance.  Outcome: Progressing LBM 12/12 after suppository

## 2013-08-14 NOTE — Progress Notes (Signed)
Patient ID: Keith [name], male   DOB: [birth year], 67 y.o.   MRN: [id number] 67 y.o. male with history of DM, HTN, who was admitted on 07/31/13 with left facial droop with dysarthria and one week history of imbalance as diplopia X 2 days. MRI/MRA brain done revealing two small acute brainstem infarcts in-right paracentral pons, and central to left paracentral medulla possibly involving left pyramid, no stenosis. 2D echo with EF 55-60% and grade 1 diastolic dysfunction. Carotid dopplers with 1-39% ICA stenosis. Neurology services consulted maintained on aspirin and Plavix therapy for CVA prophylaxis. Subcutaneous heparin added for DVT prophylaxis.Marland Kitchen MBS done 08/01/13 alternative nutritional means and NPO recommended    Subjective/Complaints: No SOB, no CP MBS 12/9 still NPO Occ cough  Bowels moving well now Per IR needs to be off plavix until next Wed Review of Systems - otherwise negative   Objective: Vital Signs: Blood pressure 148/66, pulse 65, temperature 97.4 F (36.3 C), temperature source Oral, resp. rate 17, weight 75.5 kg (166 lb 7.2 oz), SpO2 96.00%. No results found. Results for orders placed during the hospital encounter of 08/05/13 (from the past 72 hour(s))  GLUCOSE, CAPILLARY     Status: Abnormal   Collection Time    08/11/13 11:30 AM      Result Value Range   Glucose-Capillary 158 (*) 70 - 99 mg/dL   Comment 1 Notify RN    GLUCOSE, CAPILLARY     Status: Abnormal   Collection Time    08/11/13  4:22 PM      Result Value Range   Glucose-Capillary 148 (*) 70 - 99 mg/dL   Comment 1 Notify RN    GLUCOSE, CAPILLARY     Status: Abnormal   Collection Time    08/11/13  7:44 PM      Result Value Range   Glucose-Capillary 152 (*) 70 - 99 mg/dL  GLUCOSE, CAPILLARY     Status: Abnormal   Collection Time    08/11/13 11:54 PM      Result Value Range   Glucose-Capillary 169 (*) 70 - 99 mg/dL  GLUCOSE, CAPILLARY     Status: Abnormal   Collection Time    08/12/13  3:41 AM   Result Value Range   Glucose-Capillary 166 (*) 70 - 99 mg/dL  GLUCOSE, CAPILLARY     Status: Abnormal   Collection Time    08/12/13  7:16 AM      Result Value Range   Glucose-Capillary 156 (*) 70 - 99 mg/dL   Comment 1 Notify RN    GLUCOSE, CAPILLARY     Status: Abnormal   Collection Time    08/12/13 11:35 AM      Result Value Range   Glucose-Capillary 148 (*) 70 - 99 mg/dL   Comment 1 Notify RN    GLUCOSE, CAPILLARY     Status: Abnormal   Collection Time    08/12/13  4:25 PM      Result Value Range   Glucose-Capillary 169 (*) 70 - 99 mg/dL   Comment 1 Notify RN    GLUCOSE, CAPILLARY     Status: Abnormal   Collection Time    08/12/13  7:48 PM      Result Value Range   Glucose-Capillary 210 (*) 70 - 99 mg/dL  GLUCOSE, CAPILLARY     Status: Abnormal   Collection Time    08/13/13 12:21 AM      Result Value Range   Glucose-Capillary 192 (*) 70 - 99 mg/dL  GLUCOSE,  CAPILLARY     Status: Abnormal   Collection Time    08/13/13  3:51 AM      Result Value Range   Glucose-Capillary 164 (*) 70 - 99 mg/dL  GLUCOSE, CAPILLARY     Status: Abnormal   Collection Time    08/13/13  7:44 AM      Result Value Range   Glucose-Capillary 218 (*) 70 - 99 mg/dL   Comment 1 Notify RN    GLUCOSE, CAPILLARY     Status: Abnormal   Collection Time    08/13/13 11:07 AM      Result Value Range   Glucose-Capillary 260 (*) 70 - 99 mg/dL  GLUCOSE, CAPILLARY     Status: Abnormal   Collection Time    08/13/13  4:27 PM      Result Value Range   Glucose-Capillary 179 (*) 70 - 99 mg/dL  GLUCOSE, CAPILLARY     Status: Abnormal   Collection Time    08/13/13  7:59 PM      Result Value Range   Glucose-Capillary 160 (*) 70 - 99 mg/dL   Comment 1 Notify RN    GLUCOSE, CAPILLARY     Status: Abnormal   Collection Time    08/13/13 11:47 PM      Result Value Range   Glucose-Capillary 257 (*) 70 - 99 mg/dL   Comment 1 Notify RN    GLUCOSE, CAPILLARY     Status: Abnormal   Collection Time    08/14/13   3:56 AM      Result Value Range   Glucose-Capillary 224 (*) 70 - 99 mg/dL   Comment 1 Notify RN    GLUCOSE, CAPILLARY     Status: Abnormal   Collection Time    08/14/13  7:59 AM      Result Value Range   Glucose-Capillary 250 (*) 70 - 99 mg/dL   Comment 1 Notify RN       HEENT: normal Cardio: RRR and no murmur Resp: CTA B/L and unlabored GI: BS positive and non distended Extremity:  Pulses positive and No Edema Skin:   Intact Neuro: a little slow this am, Cranial Nerve Abnormalities Left central 7, Abnormal Motor 3-/5 LUE, 3-/5 LLE, 5/5 on  Right side, Abnormal FMC Ataxic/ dec FMC and Dysarthric. Severe dymetria left FNF and Left Heel to Shin Musc/Skel:  Normal GEN: NAD   Assessment/Plan: 1. Functional deficits secondary to Right pontine and Left medullary infarcts which require 3+ hours per day of interdisciplinary therapy in a comprehensive inpatient rehab setting. Physiatrist is providing close team supervision and 24 hour management of active medical problems listed below. Physiatrist and rehab team continue to assess barriers to discharge/monitor patient progress toward functional and medical goals. FIM: FIM - Bathing Bathing Steps Patient Completed: Chest;Right Arm;Left Arm;Abdomen;Front perineal area;Buttocks;Right upper leg;Left upper leg;Right lower leg (including foot);Left lower leg (including foot) Bathing: 4: Steadying assist  FIM - Upper Body Dressing/Undressing Upper body dressing/undressing steps patient completed: Thread/unthread right sleeve of pullover shirt/dresss;Thread/unthread left sleeve of pullover shirt/dress;Put head through opening of pull over shirt/dress;Pull shirt over trunk;Thread/unthread right sleeve of front closure shirt/dress;Thread/unthread left sleeve of front closure shirt/dress;Pull shirt around back of front closure shirt/dress;Button/unbutton shirt Upper body dressing/undressing: 5: Supervision: Safety issues/verbal cues FIM - Lower Body  Dressing/Undressing Lower body dressing/undressing steps patient completed: Thread/unthread right underwear leg;Thread/unthread left underwear leg;Pull underwear up/down;Thread/unthread right pants leg;Thread/unthread left pants leg;Pull pants up/down;Fasten/unfasten pants;Don/Doff right sock;Don/Doff left sock;Don/Doff right shoe;Don/Doff left shoe;Fasten/unfasten  right shoe;Fasten/unfasten left shoe Lower body dressing/undressing: 5: Supervision: Safety issues/verbal cues  FIM - Toileting Toileting steps completed by patient: Adjust clothing prior to toileting;Performs perineal hygiene;Adjust clothing after toileting Toileting Assistive Devices: Grab bar or rail for support Toileting: 0: Activity did not occur  FIM - Diplomatic Services operational officer Devices: Grab bars Toilet Transfers: 0-Activity did not occur  FIM - Banker Devices: Arm rests Bed/Chair Transfer: 4: Bed > Chair or W/C: Min A (steadying Pt. > 75%);4: Chair or W/C > Bed: Min A (steadying Pt. > 75%);5: Supine > Sit: Supervision (verbal cues/safety issues);5: Sit > Supine: Supervision (verbal cues/safety issues)  FIM - Locomotion: Wheelchair Distance: 155 Locomotion: Wheelchair: 5: Travels 150 ft or more: maneuvers on rugs and over door sills with supervision, cueing or coaxing FIM - Locomotion: Ambulation Locomotion: Ambulation Assistive Devices:  (no AD) Ambulation/Gait Assistance: 4: Min assist;3: Mod assist Locomotion: Ambulation: 3: Travels 150 ft or more with moderate assistance (Pt: 50 - 74%)  Comprehension Comprehension Mode: Auditory Comprehension: 5-Follows basic conversation/direction: With extra time/assistive device  Expression Expression Mode: Verbal Expression: 4-Expresses basic 75 - 89% of the time/requires cueing 10 - 24% of the time. Needs helper to occlude trach/needs to repeat words.  Social Interaction Social Interaction: 5-Interacts appropriately  90% of the time - Needs monitoring or encouragement for participation or interaction.  Problem Solving Problem Solving: 5-Solves basic 90% of the time/requires cueing < 10% of the time  Memory Memory: 5-Recognizes or recalls 90% of the time/requires cueing < 10% of the time  Medical Problem List and Plan:  1. Thrombotic left medullary, right paracentral pontine infarct  2. DVT Prophylaxis/Anticoagulation: Subcutaneous heparin. Monitor platelet counts and any signs of bleeding  3. Pain Management: Tylenol as needed for headache--this has been effective 4. Mood: Zoloft 50 mg daily, Ativan 0.5 mg every 4 as needed anxiety  5. Neuropsych: This patient is capable of making decisions on his own behalf.  6. Dysphagia. Patient currently n.p.o. with nasogastric tube feeds.  Will need PEG, pt in agreement will need to touch base with son as well.  IR can do this on Wed 12/17 7. Hypertension. Norvasc 5 mg daily.  Borderline controlled at present  8. Hyperlipidemia. Lipitor  9. Diabetes mellitus with peripheral neuropathy. controlledHemoglobin A1c 7.4. Continue sliding scale insulin.Increase Glucophage 500 mg to twice a day . Blood sugars have been climbing.     LOS (Days) 9 A FACE TO FACE EVALUATION WAS PERFORMED  Michae Grimley E 08/14/2013, 8:41 AM

## 2013-08-15 ENCOUNTER — Inpatient Hospital Stay (HOSPITAL_COMMUNITY): Payer: Medicare Other | Admitting: Physical Therapy

## 2013-08-15 ENCOUNTER — Inpatient Hospital Stay (HOSPITAL_COMMUNITY): Payer: Medicare Other

## 2013-08-15 LAB — GLUCOSE, CAPILLARY
Glucose-Capillary: 261 mg/dL — ABNORMAL HIGH (ref 70–99)
Glucose-Capillary: 141 mg/dL — ABNORMAL HIGH (ref 70–99)
Glucose-Capillary: 163 mg/dL — ABNORMAL HIGH (ref 70–99)
Glucose-Capillary: 170 mg/dL — ABNORMAL HIGH (ref 70–99)

## 2013-08-15 MED ORDER — DEXTROSE-NACL 5-0.45 % IV SOLN
INTRAVENOUS | Status: DC
  Administered 2013-08-15 – 2013-08-17 (×3): via INTRAVENOUS

## 2013-08-15 MED ORDER — MUSCLE RUB 10-15 % EX CREA
TOPICAL_CREAM | CUTANEOUS | Status: DC | PRN
  Filled 2013-08-15: qty 85

## 2013-08-15 MED ORDER — KETOROLAC TROMETHAMINE 15 MG/ML IJ SOLN
15.0000 mg | Freq: Four times a day (QID) | INTRAMUSCULAR | Status: DC | PRN
  Administered 2013-08-15 – 2013-08-17 (×5): 15 mg via INTRAVENOUS
  Filled 2013-08-15 (×6): qty 1

## 2013-08-15 NOTE — Progress Notes (Signed)
Physical Therapy Note  Patient Details  Name: Keith [name] MRN: [id number] Date of Birth: [birth year] Today's Date: 08/15/2013  1100-1125 (25 minutes) individual Pain: no complaint of pain Other: Blurred vision RT > LT Focus of treatment: Gait training without AD focusing on balance reactions when changing directions; steps Treatment: Pt up in wc upon arrival; wc mobility 120 feet SBA using bilateral LEs ; gait training - obstacle course + 1 step + 2-3 steps with rail without AD- pt required several assist to prevent loss of balance with changing directions ( reminded to maintain adequate BOS) and stepping down from one step (posterior loss of balance); returned to room with all needs within reach.   Cordie Beazley,JIM 08/15/2013, 11:11 AM

## 2013-08-15 NOTE — Progress Notes (Signed)
Patient ID: Keith [name], male   DOB: Sep 25, 1945, 67 y.o.   MRN: [id number] 67 y.o. male with history of DM, HTN, who was admitted on 07/31/13 with left facial droop with dysarthria and one week history of imbalance as diplopia X 2 days. MRI/MRA brain done revealing two small acute brainstem infarcts in-right paracentral pons, and central to left paracentral medulla possibly involving left pyramid, no stenosis. 2D echo with EF 55-60% and grade 1 diastolic dysfunction. Carotid dopplers with 1-39% ICA stenosis. Neurology services consulted maintained on aspirin and Plavix therapy for CVA prophylaxis. Subcutaneous heparin added for DVT prophylaxis.Marland Kitchen MBS done 08/01/13 alternative nutritional means and NPO recommended    Subjective/Complaints: NPO, but pt feels he is swallowing secretions better  Bowels moving well now Per IR needs to be off plavix until next Wed Review of Systems - otherwise negative   Objective: Vital Signs: Blood pressure 123/68, pulse 60, temperature 98.1 F (36.7 C), temperature source Oral, resp. rate 18, weight 76.386 kg (168 lb 6.4 oz), SpO2 93.00%. No results found. Results for orders placed during the hospital encounter of 08/05/13 (from the past 72 hour(s))  GLUCOSE, CAPILLARY     Status: Abnormal   Collection Time    08/12/13 11:35 AM      Result Value Range   Glucose-Capillary 148 (*) 70 - 99 mg/dL   Comment 1 Notify RN    GLUCOSE, CAPILLARY     Status: Abnormal   Collection Time    08/12/13  4:25 PM      Result Value Range   Glucose-Capillary 169 (*) 70 - 99 mg/dL   Comment 1 Notify RN    GLUCOSE, CAPILLARY     Status: Abnormal   Collection Time    08/12/13  7:48 PM      Result Value Range   Glucose-Capillary 210 (*) 70 - 99 mg/dL  GLUCOSE, CAPILLARY     Status: Abnormal   Collection Time    08/13/13 12:21 AM      Result Value Range   Glucose-Capillary 192 (*) 70 - 99 mg/dL  GLUCOSE, CAPILLARY     Status: Abnormal   Collection Time    08/13/13  3:51 AM       Result Value Range   Glucose-Capillary 164 (*) 70 - 99 mg/dL  GLUCOSE, CAPILLARY     Status: Abnormal   Collection Time    08/13/13  7:44 AM      Result Value Range   Glucose-Capillary 218 (*) 70 - 99 mg/dL   Comment 1 Notify RN    GLUCOSE, CAPILLARY     Status: Abnormal   Collection Time    08/13/13 11:07 AM      Result Value Range   Glucose-Capillary 260 (*) 70 - 99 mg/dL  GLUCOSE, CAPILLARY     Status: Abnormal   Collection Time    08/13/13  4:27 PM      Result Value Range   Glucose-Capillary 179 (*) 70 - 99 mg/dL  GLUCOSE, CAPILLARY     Status: Abnormal   Collection Time    08/13/13  7:59 PM      Result Value Range   Glucose-Capillary 160 (*) 70 - 99 mg/dL   Comment 1 Notify RN    GLUCOSE, CAPILLARY     Status: Abnormal   Collection Time    08/13/13 11:47 PM      Result Value Range   Glucose-Capillary 257 (*) 70 - 99 mg/dL   Comment 1 Notify RN  GLUCOSE, CAPILLARY     Status: Abnormal   Collection Time    08/14/13  3:56 AM      Result Value Range   Glucose-Capillary 224 (*) 70 - 99 mg/dL   Comment 1 Notify RN    GLUCOSE, CAPILLARY     Status: Abnormal   Collection Time    08/14/13  7:59 AM      Result Value Range   Glucose-Capillary 250 (*) 70 - 99 mg/dL   Comment 1 Notify RN    GLUCOSE, CAPILLARY     Status: Abnormal   Collection Time    08/14/13 11:29 AM      Result Value Range   Glucose-Capillary 219 (*) 70 - 99 mg/dL   Comment 1 Notify RN    GLUCOSE, CAPILLARY     Status: Abnormal   Collection Time    08/14/13  4:25 PM      Result Value Range   Glucose-Capillary 160 (*) 70 - 99 mg/dL   Comment 1 Documented in Chart     Comment 2 Notify RN    GLUCOSE, CAPILLARY     Status: Abnormal   Collection Time    08/14/13  7:59 PM      Result Value Range   Glucose-Capillary 177 (*) 70 - 99 mg/dL   Comment 1 Notify RN    GLUCOSE, CAPILLARY     Status: Abnormal   Collection Time    08/15/13 12:10 AM      Result Value Range   Glucose-Capillary 200 (*)  70 - 99 mg/dL   Comment 1 Notify RN    GLUCOSE, CAPILLARY     Status: Abnormal   Collection Time    08/15/13  4:00 AM      Result Value Range   Glucose-Capillary 261 (*) 70 - 99 mg/dL   Comment 1 Notify RN    GLUCOSE, CAPILLARY     Status: Abnormal   Collection Time    08/15/13  7:36 AM      Result Value Range   Glucose-Capillary 141 (*) 70 - 99 mg/dL   Comment 1 Notify RN       HEENT: normal Cardio: RRR and no murmur Resp: CTA B/L and unlabored GI: BS positive and non distended Extremity:  Pulses positive and No Edema Skin:   Intact Neuro: a little slow this am, Cranial Nerve Abnormalities Left central 7, Abnormal Motor 3-/5 LUE, 3-/5 LLE, 5/5 on  Right side, Abnormal FMC Ataxic/ dec FMC and Dysarthric. Severe dymetria left FNF and Left Heel to Shin Musc/Skel:  Normal GEN: NAD   Assessment/Plan: 1. Functional deficits secondary to Right pontine and Left medullary infarcts which require 3+ hours per day of interdisciplinary therapy in a comprehensive inpatient rehab setting. Physiatrist is providing close team supervision and 24 hour management of active medical problems listed below. Physiatrist and rehab team continue to assess barriers to discharge/monitor patient progress toward functional and medical goals. FIM: FIM - Bathing Bathing Steps Patient Completed: Chest;Right Arm;Left Arm;Abdomen;Front perineal area;Buttocks;Right upper leg;Left upper leg;Right lower leg (including foot);Left lower leg (including foot) Bathing: 4: Steadying assist  FIM - Upper Body Dressing/Undressing Upper body dressing/undressing steps patient completed: Thread/unthread right sleeve of pullover shirt/dresss;Thread/unthread left sleeve of pullover shirt/dress;Put head through opening of pull over shirt/dress;Pull shirt over trunk;Thread/unthread right sleeve of front closure shirt/dress;Thread/unthread left sleeve of front closure shirt/dress;Pull shirt around back of front closure  shirt/dress;Button/unbutton shirt Upper body dressing/undressing: 5: Supervision: Safety issues/verbal cues FIM - Lower  Body Dressing/Undressing Lower body dressing/undressing steps patient completed: Thread/unthread right underwear leg;Thread/unthread left underwear leg;Pull underwear up/down;Thread/unthread right pants leg;Thread/unthread left pants leg;Pull pants up/down;Fasten/unfasten pants;Don/Doff right sock;Don/Doff left sock;Don/Doff right shoe;Don/Doff left shoe;Fasten/unfasten right shoe;Fasten/unfasten left shoe Lower body dressing/undressing: 5: Supervision: Safety issues/verbal cues  FIM - Toileting Toileting steps completed by patient: Adjust clothing prior to toileting;Performs perineal hygiene;Adjust clothing after toileting Toileting Assistive Devices: Grab bar or rail for support Toileting: 0: Activity did not occur  FIM - Diplomatic Services operational officer Devices: Therapist, music Transfers: 0-Activity did not occur  FIM - Banker Devices: Arm rests Bed/Chair Transfer: 4: Bed > Chair or W/C: Min A (steadying Pt. > 75%);4: Chair or W/C > Bed: Min A (steadying Pt. > 75%);5: Supine > Sit: Supervision (verbal cues/safety issues);5: Sit > Supine: Supervision (verbal cues/safety issues)  FIM - Locomotion: Wheelchair Distance: 155 Locomotion: Wheelchair: 5: Travels 150 ft or more: maneuvers on rugs and over door sills with supervision, cueing or coaxing FIM - Locomotion: Ambulation Locomotion: Ambulation Assistive Devices:  (no AD) Ambulation/Gait Assistance: 4: Min assist;3: Mod assist Locomotion: Ambulation: 3: Travels 150 ft or more with moderate assistance (Pt: 50 - 74%)  Comprehension Comprehension Mode: Auditory Comprehension: 5-Follows basic conversation/direction: With extra time/assistive device  Expression Expression Mode: Verbal Expression: 5-Expresses basic needs/ideas: With extra time/assistive  device  Social Interaction Social Interaction: 5-Interacts appropriately 90% of the time - Needs monitoring or encouragement for participation or interaction.  Problem Solving Problem Solving: 5-Solves basic 90% of the time/requires cueing < 10% of the time  Memory Memory: 5-Recognizes or recalls 90% of the time/requires cueing < 10% of the time  Medical Problem List and Plan:  1. Thrombotic left medullary, right paracentral pontine infarct  2. DVT Prophylaxis/Anticoagulation: Subcutaneous heparin. Monitor platelet counts and any signs of bleeding  3. Pain Management: Tylenol as needed for headache--this has been effective 4. Mood: Zoloft 50 mg daily, Ativan 0.5 mg every 4 as needed anxiety  5. Neuropsych: This patient is capable of making decisions on his own behalf.  6. Dysphagia. Patient currently n.p.o. with nasogastric tube feeds.  Will probably need PEG, but pt subjectively feel swallow has improved.  IR can do this on Wed 12/17.  Will ask SLP to reassess in am 7. Hypertension. Norvasc 5 mg daily.  Borderline controlled at present  8. Hyperlipidemia. Lipitor  9. Diabetes mellitus with peripheral neuropathy. Controlled Hemoglobin A1c 7.4. Continue sliding scale insulin.Increase Glucophage 500 mg to twice a day . Blood sugars have been climbing.     LOS (Days) 10 A FACE TO FACE EVALUATION WAS PERFORMED  Izzabelle Bouley E 08/15/2013, 8:42 AM

## 2013-08-15 NOTE — Progress Notes (Signed)
KUB reported stated NG in distal esophagus, needing advancement.  RNsx2 attempted advancement, causing significant discomfort to Pt.  Used flashlight to visualize throat and observed tube coiled in throat.  NG pulled, MD aware.  Orders received and being implemented.  Will con't plan of care.

## 2013-08-15 NOTE — Progress Notes (Signed)
Panda clogged in am. Repeatedly tried to flush but unable. Per MD order, NG tube placed at 1500. Patient tolerated fine. Awaiting KUB to verify placement.

## 2013-08-16 ENCOUNTER — Inpatient Hospital Stay (HOSPITAL_COMMUNITY): Payer: Medicare Other | Admitting: Speech Pathology

## 2013-08-16 ENCOUNTER — Inpatient Hospital Stay (HOSPITAL_COMMUNITY): Payer: Medicare Other | Admitting: Rehabilitation

## 2013-08-16 ENCOUNTER — Encounter (HOSPITAL_COMMUNITY): Payer: Medicare Other | Admitting: Occupational Therapy

## 2013-08-16 DIAGNOSIS — I6330 Cerebral infarction due to thrombosis of unspecified cerebral artery: Secondary | ICD-10-CM

## 2013-08-16 DIAGNOSIS — F1010 Alcohol abuse, uncomplicated: Secondary | ICD-10-CM

## 2013-08-16 DIAGNOSIS — I69991 Dysphagia following unspecified cerebrovascular disease: Secondary | ICD-10-CM

## 2013-08-16 DIAGNOSIS — I10 Essential (primary) hypertension: Secondary | ICD-10-CM

## 2013-08-16 DIAGNOSIS — I69993 Ataxia following unspecified cerebrovascular disease: Secondary | ICD-10-CM

## 2013-08-16 LAB — GLUCOSE, CAPILLARY
Glucose-Capillary: 124 mg/dL — ABNORMAL HIGH (ref 70–99)
Glucose-Capillary: 158 mg/dL — ABNORMAL HIGH (ref 70–99)
Glucose-Capillary: 180 mg/dL — ABNORMAL HIGH (ref 70–99)
Glucose-Capillary: 186 mg/dL — ABNORMAL HIGH (ref 70–99)
Glucose-Capillary: 188 mg/dL — ABNORMAL HIGH (ref 70–99)
Glucose-Capillary: 238 mg/dL — ABNORMAL HIGH (ref 70–99)

## 2013-08-16 LAB — BASIC METABOLIC PANEL
BUN: 18 mg/dL (ref 6–23)
CO2: 26 mEq/L (ref 19–32)
Calcium: 9.6 mg/dL (ref 8.4–10.5)
Chloride: 94 mEq/L — ABNORMAL LOW (ref 96–112)
GFR calc Af Amer: >90 mL/min (ref >90)
GFR calc non Af Amer: 89 mL/min — ABNORMAL LOW (ref >90)
Glucose, Bld: 197 mg/dL — ABNORMAL HIGH (ref 70–99)

## 2013-08-16 NOTE — Progress Notes (Signed)
Occupational Therapy Session Note  Patient Details  Name: Keith [name] MRN: [id number] Date of Birth: Jun 11, 1946  Today's Date: 08/16/2013 Time: 0905-1001 Time Calculation (min): 56 min  Short Term Goals: Week 2:  OT Short Term Goal 1 (Week 2): Pt will perform bathing sit to stand with supervision. OT Short Term Goal 2 (Week 2): Pt will perform dressing with supervision sit to stand. OT Short Term Goal 3 (Week 2): Pt will perform toileting and toilet transfers with supervision. OT Short Term Goal 4 (Week 2): Pt will use the LUE at a diminshed level for selfcare tasks with independence.  Skilled Therapeutic Interventions/Progress Updates:    Pt performed bathing and dressing sit to stand at the sink.  Pt ambulated with min assist to collect his clothing, towels, and his wash pan for the bathing task prior to starting.  Pt bathed sit to stand at the sink with overall min assist.  Pt stood for washing his UB and then sat for washing his lower legs and feet.  Pt with one LOB posteriorly while standing.  He was able to use the LUE more efficiently with dressing tasks by being able to fasten his smaller buttons on the shirt and being able to tie his shoes.  He did however need increased time to perform the buttoning task.    Therapy Documentation Precautions:  Precautions Precautions: Fall Precaution Comments: NPO, NG feeding tube in place, ataxia Restrictions Weight Bearing Restrictions: No  Pain: Pain Assessment Pain Assessment: 0-10 Pain Score: 0-No pain Pain Type: Acute pain Pain Location: Head Pain Orientation: Anterior Pain Descriptors / Indicators: Aching Pain Frequency: Intermittent Pain Onset: Gradual Patients Stated Pain Goal: 2 Pain Intervention(s): Medication (See eMAR) ADL: See FIM for current functional status  Therapy/Group: Individual Therapy  Martha Ellerby OTR/L 08/16/2013, 1:42 PM

## 2013-08-16 NOTE — Plan of Care (Signed)
Problem: RH PAIN MANAGEMENT Goal: RH STG PAIN MANAGED AT OR BELOW PT'S PAIN GOAL 3 or less on scale 0-10  Outcome: Not Progressing Patient rate pain 5-9 for heaache. adm

## 2013-08-16 NOTE — Progress Notes (Signed)
Patient with increasing complaints of headache pain Saturday and Sunday. Ruddy facial complexion. Skin warm and dry. Dr. Wynn Banker paged orders given for 15 mg Toradol Q6 hrs prn, K pad, and Analgesic Ointment for headache. Toradol effective.  adm

## 2013-08-16 NOTE — Progress Notes (Signed)
Patient ID: Keith [name], male   DOB: [birth year], 67 y.o.   MRN: [id number] 67 y.o. male with history of DM, HTN, who was admitted on 07/31/13 with left facial droop with dysarthria and one week history of imbalance as diplopia X 2 days. MRI/MRA brain done revealing two small acute brainstem infarcts in-right paracentral pons, and central to left paracentral medulla possibly involving left pyramid, no stenosis. 2D echo with EF 55-60% and grade 1 diastolic dysfunction. Carotid dopplers with 1-39% ICA stenosis. Neurology services consulted maintained on aspirin and Plavix therapy for CVA prophylaxis. Subcutaneous heparin added for DVT prophylaxis.Marland Kitchen MBS done 08/01/13 alternative nutritional means and NPO recommended    Subjective/Complaints: NPO, but pt feels he is swallowing secretions better  Panda 2 became clogged yesterday and was removed. Attempted and G-tube placement but nursing was not able to do this Per IR needs to be off plavix until next Wed  Frontal HA improved with IV toradol Review of Systems - otherwise negative   Objective: Vital Signs: Blood pressure 125/63, pulse 56, temperature 97 F (36.1 C), temperature source Oral, resp. rate 17, weight 75.841 kg (167 lb 3.2 oz), SpO2 93.00%. Dg Abd Portable 1v  08/15/2013   CLINICAL DATA:  NG placement  EXAM: PORTABLE ABDOMEN - 1 VIEW  COMPARISON:  08/03/2013  FINDINGS: The previously seen feeding catheter is been removed in a nasogastric catheter placed. It extends into the distal esophagus but in short of the gastroesophageal junction. Fecal material is noted throughout the colon. No mass or abnormal calcifications are identified. Postsurgical changes are seen in the pelvis.  IMPRESSION: Nasogastric catheter within the distal esophagus. This should be advanced into the stomach.   Electronically Signed   By: Alcide Clever M.D.   On: 08/15/2013 15:33   Results for orders placed during the hospital encounter of 08/05/13 (from the past 72 hour(s))   GLUCOSE, CAPILLARY     Status: Abnormal   Collection Time    08/13/13 11:07 AM      Result Value Range   Glucose-Capillary 260 (*) 70 - 99 mg/dL  GLUCOSE, CAPILLARY     Status: Abnormal   Collection Time    08/13/13  4:27 PM      Result Value Range   Glucose-Capillary 179 (*) 70 - 99 mg/dL  GLUCOSE, CAPILLARY     Status: Abnormal   Collection Time    08/13/13  7:59 PM      Result Value Range   Glucose-Capillary 160 (*) 70 - 99 mg/dL   Comment 1 Notify RN    GLUCOSE, CAPILLARY     Status: Abnormal   Collection Time    08/13/13 11:47 PM      Result Value Range   Glucose-Capillary 257 (*) 70 - 99 mg/dL   Comment 1 Notify RN    GLUCOSE, CAPILLARY     Status: Abnormal   Collection Time    08/14/13  3:56 AM      Result Value Range   Glucose-Capillary 224 (*) 70 - 99 mg/dL   Comment 1 Notify RN    GLUCOSE, CAPILLARY     Status: Abnormal   Collection Time    08/14/13  7:59 AM      Result Value Range   Glucose-Capillary 250 (*) 70 - 99 mg/dL   Comment 1 Notify RN    GLUCOSE, CAPILLARY     Status: Abnormal   Collection Time    08/14/13 11:29 AM      Result Value Range  Glucose-Capillary 219 (*) 70 - 99 mg/dL   Comment 1 Notify RN    GLUCOSE, CAPILLARY     Status: Abnormal   Collection Time    08/14/13  4:25 PM      Result Value Range   Glucose-Capillary 160 (*) 70 - 99 mg/dL   Comment 1 Documented in Chart     Comment 2 Notify RN    GLUCOSE, CAPILLARY     Status: Abnormal   Collection Time    08/14/13  7:59 PM      Result Value Range   Glucose-Capillary 177 (*) 70 - 99 mg/dL   Comment 1 Notify RN    GLUCOSE, CAPILLARY     Status: Abnormal   Collection Time    08/15/13 12:10 AM      Result Value Range   Glucose-Capillary 200 (*) 70 - 99 mg/dL   Comment 1 Notify RN    GLUCOSE, CAPILLARY     Status: Abnormal   Collection Time    08/15/13  4:00 AM      Result Value Range   Glucose-Capillary 261 (*) 70 - 99 mg/dL   Comment 1 Notify RN    GLUCOSE, CAPILLARY      Status: Abnormal   Collection Time    08/15/13  7:36 AM      Result Value Range   Glucose-Capillary 141 (*) 70 - 99 mg/dL   Comment 1 Notify RN    GLUCOSE, CAPILLARY     Status: Abnormal   Collection Time    08/15/13 11:48 AM      Result Value Range   Glucose-Capillary 163 (*) 70 - 99 mg/dL   Comment 1 Notify RN    GLUCOSE, CAPILLARY     Status: Abnormal   Collection Time    08/15/13  4:02 PM      Result Value Range   Glucose-Capillary 170 (*) 70 - 99 mg/dL   Comment 1 Notify RN    GLUCOSE, CAPILLARY     Status: Abnormal   Collection Time    08/15/13  8:35 PM      Result Value Range   Glucose-Capillary 185 (*) 70 - 99 mg/dL   Comment 1 Notify RN    GLUCOSE, CAPILLARY     Status: Abnormal   Collection Time    08/16/13 12:47 AM      Result Value Range   Glucose-Capillary 186 (*) 70 - 99 mg/dL   Comment 1 Notify RN    GLUCOSE, CAPILLARY     Status: Abnormal   Collection Time    08/16/13  4:03 AM      Result Value Range   Glucose-Capillary 180 (*) 70 - 99 mg/dL   Comment 1 Notify RN    BASIC METABOLIC PANEL     Status: Abnormal   Collection Time    08/16/13  5:45 AM      Result Value Range   Sodium 132 (*) 135 - 145 mEq/L   Potassium 4.5  3.5 - 5.1 mEq/L   Chloride 94 (*) 96 - 112 mEq/L   CO2 26  19 - 32 mEq/L   Glucose, Bld 197 (*) 70 - 99 mg/dL   BUN 18  6 - 23 mg/dL   Creatinine, Ser 2.95  0.50 - 1.35 mg/dL   Calcium 9.6  8.4 - 62.1 mg/dL   GFR calc non Af Amer 89 (*) >90 mL/min   GFR calc Af Amer >90  >90 mL/min   Comment: (NOTE)  The eGFR has been calculated using the CKD EPI equation.     This calculation has not been validated in all clinical situations.     eGFR's persistently <90 mL/min signify possible Chronic Kidney     Disease.  GLUCOSE, CAPILLARY     Status: Abnormal   Collection Time    08/16/13  7:35 AM      Result Value Range   Glucose-Capillary 188 (*) 70 - 99 mg/dL   Comment 1 Notify RN       HEENT: normal Cardio: RRR and no  murmur Resp: CTA B/L and unlabored GI: BS positive and non distended Extremity:  Pulses positive and No Edema Skin:   Intact Neuro: a little slow this am, Cranial Nerve Abnormalities Left central 7, Abnormal Motor 3-/5 LUE, 3-/5 LLE, 5/5 on  Right side, Abnormal FMC Ataxic/ dec FMC and Dysarthric. Severe dymetria left FNF and Left Heel to Shin Musc/Skel:  Normal GEN: NAD   Assessment/Plan: 1. Functional deficits secondary to Right pontine and Left medullary infarcts which require 3+ hours per day of interdisciplinary therapy in a comprehensive inpatient rehab setting. Physiatrist is providing close team supervision and 24 hour management of active medical problems listed below. Physiatrist and rehab team continue to assess barriers to discharge/monitor patient progress toward functional and medical goals. FIM: FIM - Bathing Bathing Steps Patient Completed: Chest;Right Arm;Left Arm;Abdomen;Front perineal area;Buttocks;Right upper leg;Left upper leg;Right lower leg (including foot);Left lower leg (including foot) Bathing: 4: Steadying assist  FIM - Upper Body Dressing/Undressing Upper body dressing/undressing steps patient completed: Thread/unthread right sleeve of pullover shirt/dresss;Thread/unthread left sleeve of pullover shirt/dress;Put head through opening of pull over shirt/dress;Pull shirt over trunk;Thread/unthread right sleeve of front closure shirt/dress;Thread/unthread left sleeve of front closure shirt/dress;Pull shirt around back of front closure shirt/dress;Button/unbutton shirt Upper body dressing/undressing: 5: Supervision: Safety issues/verbal cues FIM - Lower Body Dressing/Undressing Lower body dressing/undressing steps patient completed: Thread/unthread right underwear leg;Thread/unthread left underwear leg;Pull underwear up/down;Thread/unthread right pants leg;Thread/unthread left pants leg;Pull pants up/down;Fasten/unfasten pants;Don/Doff right sock;Don/Doff left  sock;Don/Doff right shoe;Don/Doff left shoe;Fasten/unfasten right shoe;Fasten/unfasten left shoe Lower body dressing/undressing: 5: Supervision: Safety issues/verbal cues  FIM - Toileting Toileting steps completed by patient: Adjust clothing prior to toileting;Performs perineal hygiene;Adjust clothing after toileting Toileting Assistive Devices: Grab bar or rail for support Toileting: 0: Activity did not occur  FIM - Diplomatic Services operational officer Devices: Grab bars Toilet Transfers: 0-Activity did not occur  FIM - Banker Devices: Bed rails;Arm rests Bed/Chair Transfer: 4: Bed > Chair or W/C: Min A (steadying Pt. > 75%);4: Chair or W/C > Bed: Min A (steadying Pt. > 75%);5: Supine > Sit: Supervision (verbal cues/safety issues);5: Sit > Supine: Supervision (verbal cues/safety issues)  FIM - Locomotion: Wheelchair Distance: 155 Locomotion: Wheelchair: 5: Travels 150 ft or more: maneuvers on rugs and over door sills with supervision, cueing or coaxing FIM - Locomotion: Ambulation Locomotion: Ambulation Assistive Devices:  (no AD) Ambulation/Gait Assistance: 4: Min assist;3: Mod assist Locomotion: Ambulation: 3: Travels 150 ft or more with moderate assistance (Pt: 50 - 74%)  Comprehension Comprehension Mode: Auditory Comprehension: 5-Understands basic 90% of the time/requires cueing < 10% of the time  Expression Expression Mode: Verbal Expression: 5-Expresses basic needs/ideas: With extra time/assistive device  Social Interaction Social Interaction: 5-Interacts appropriately 90% of the time - Needs monitoring or encouragement for participation or interaction.  Problem Solving Problem Solving: 5-Solves complex 90% of the time/cues < 10% of the time  Memory Memory: 5-Recognizes or  recalls 90% of the time/requires cueing < 10% of the time  Medical Problem List and Plan:  1. Thrombotic left medullary, right paracentral pontine  infarct  2. DVT Prophylaxis/Anticoagulation: Subcutaneous heparin. Monitor platelet counts and any signs of bleeding  3. Pain Management: Tylenol as needed for headache--this has been effective 4. Mood: Zoloft 50 mg daily, Ativan 0.5 mg every 4 as needed anxiety  5. Neuropsych: This patient is capable of making decisions on his own behalf.  6. Dysphagia. Patient currently n.p.o.  Panda out.  Will probably need PEG, but pt subjectively feel swallow has improved.  IR can do this on Wed 12/17.  Will ask SLP to reassess this am, if no better reinsert PANDA under fluoro 7. Hypertension. Norvasc 5 mg daily.  Borderline controlled at present  8. Hyperlipidemia. Lipitor  9. Diabetes mellitus with peripheral neuropathy. Controlled Hemoglobin A1c 7.4. Continue sliding scale insulin.Increase Glucophage 500 mg to twice a day .     LOS (Days) 11 A FACE TO FACE EVALUATION WAS PERFORMED  Rubi Tooley E 08/16/2013, 9:17 AM

## 2013-08-16 NOTE — Plan of Care (Signed)
Problem: RH Car Transfers Goal: LTG Patient will perform car transfers with assist (PT) LTG: Patient will perform car transfers with assistance (PT).  Outcome: Not Applicable Date Met:  08/16/13 Pt will be D/Cing to SNF as plan of care has changed, therefore will D/C goal.   Problem: RH Stairs Goal: LTG Patient will ambulate up and down stairs w/assist (PT) LTG: Patient will ambulate up and down # of stairs with assistance (PT)  Outcome: Not Applicable Date Met:  08/16/13 Pt will be D/Cing to SNF as plan of care has changed, therefore will D/C this goal.

## 2013-08-16 NOTE — Progress Notes (Signed)
Physical Therapy Weekly Progress Note  Patient Details  Name: Keith [name] MRN: [id number] Date of Birth: September 19, 1945  Today's Date: 08/16/2013 Time: 1130-1159 and 1405-1500 Time Calculation (min): 29 mins and 55 mins  Patient has met 4 of 5 short term goals.  Pt continues to have intermittent LOB posteriorly due to decreased balance strategies, therefore requires min to mod assist with dynamic standing balance activities, however he has progressed with bed mobility, transfers and ambulation.  Also note that ataxia seems to be improving, however he does still have increased LOB with direction changes.  Continue to work on high level balance, direction changes and coordination activities during therapy sessions.  Note that his family is unable to provide 24/7 care at this time and pt will be D/Cing to SNF.  Updated plan of care and D/C'd car transfer and stair goal.   Patient continues to demonstrate the following deficits: decreased dynamic standing balance, gait abnormality, decreased coordination, ataxia, decreased safety, decreased strength (generalized but L>R) and therefore will continue to benefit from skilled PT intervention to enhance overall performance with activity tolerance, balance, postural control, ability to compensate for deficits, functional use of  left upper extremity and left lower extremity, awareness, coordination and knowledge of precautions.  Patient progressing toward long term goals..  Plan of care revisions: Goals updated due to pt now D/Cing to SNF. Marland Kitchen  PT Short Term Goals Week 1:  PT Short Term Goal 1 (Week 1): Pt will perform all aspects of bed mobility with HOB flat and without handrails at min assist consistently and safely PT Short Term Goal 1 - Progress (Week 1): Met PT Short Term Goal 2 (Week 1): Pt will perform stand pivot transfers with min assist to different level surfaces.  PT Short Term Goal 2 - Progress (Week 1): Met PT Short Term Goal 3 (Week 1): Pt will  perform dynamic standing balance at min assist 5 mins at a time to increase balance and activity tolerance.  PT Short Term Goal 3 - Progress (Week 1): Progressing toward goal (not consistently) PT Short Term Goal 4 (Week 1): Pt will ambulate x 100' w/ LRAD at min assist  PT Short Term Goal 4 - Progress (Week 1): Met PT Short Term Goal 5 (Week 1): Pt will ascend/descend 3 stairs with B handrails at min assist PT Short Term Goal 5 - Progress (Week 1): Met Week 2:  PT Short Term Goal 1 (Week 2): =LTG's due to ELOS  Skilled Therapeutic Interventions/Progress Updates:   AM session: Pt received sitting in w/c in room, agreeable to therapy this morning.  Performed gait training to/from gym with AD to improve gait quality, see details below.  Once in gym, performed high level balance activity standing on balance beam to increase ankle and hip balance strategy while reaching R and L (using L hand to hand horseshoe) for increased coordination, controlled grading of movement and fine/gross coordination of LUE.  Performed at mostly min assist with two overt LOB, requiring mod assist to correct.  Continue to note improvement in pts balance strategies during session.  Ambulated back to room and left pt in w/c with all needs in reach.    PM session:  Pt received sitting in w/c in room and agreeable to therapy session this afternoon.  Performed gait training to/from gym >150' x 2 reps without AD to continue to challenge balance, improve gait quality and decrease ataxic movements.  Requires min assist without AD, esp when making turns this afternoon  and note that when distracted externally, he tends to have LOB to the R.  Performed standing tapping to soft cones in order to increase controlled grading of movement, increase coordination, increase weight shifting and weight bearing through LLE, and also to work on SLS for balance.  Requires only min assist for task with noted marked improvement in this task with less overt  LOB and pt able to use stepping strategy to prevent LOB.  Performed obstacle course in hallway x 50' x 2 reps negotiating over wedges and around closely placed cones to address further coordination deficits.  Requires min to mod assist at times, again for LOB with direction changes.  Provided cues for increased BOS during activity, as he tends to scissor feet together, causing LOB.  Performed floor transfer x 1 rep at min assist with demonstration cues and verbal cues for correct technique, as well as when to attempt floor transfer (in case of fall in future at home) and when to call 911 for assist.  Pt verbalized understanding.  Requires assist for getting into tall kneeling, as he stood instead of kneeling from mat table and also some assist at hips for SL to quadruped.  Performed standing kinetron with max resistance to increase weight shift and weight bearing through LLE and also to provide increased proprioceptive feedback for decreased ataxia when carried over to gait.  Assisted pt back to room and into bed.  Pt able to perform sit >supine at supervision with HOB flat and without rails.  Bed alarm set and all needs in reach.   Therapy Documentation Precautions:  Precautions Precautions: Fall Precaution Comments: NPO, NG feeding tube in place, ataxia Restrictions Weight Bearing Restrictions: No   Pain: Pain Assessment Pain Assessment: No/denies pain Pain Score: 0-No pain Pain Type: Acute pain Pain Location: Head Pain Orientation: Anterior Pain Descriptors / Indicators: Aching Pain Frequency: Intermittent Pain Onset: Gradual Patients Stated Pain Goal: 2 Pain Intervention(s): Medication (See eMAR)   Locomotion : Ambulation Ambulation: Yes Ambulation/Gait Assistance: 4: Min assist Ambulation Distance (Feet): 160 Feet (>150 x 2 reps) Assistive device:  (without AD) Ambulation/Gait Assistance Details: Verbal cues for sequencing;Verbal cues for technique;Verbal cues for  precautions/safety;Verbal cues for gait pattern;Verbal cues for safe use of DME/AE;Manual facilitation for weight shifting Ambulation/Gait Assistance Details: Continue to perform gait training without RW to further challenge balance and improve gait quality.  Note that ataxic movements have greatly improved during gait, however does continue to have difficulty with changing direction, performing stepping (modified SLS) and also with increased distractions in environment.  Continue to provide min manual facilitation for increased weight shift to L and also cues for upright posture, increased stride length and increased step width.   Gait Gait: Yes Gait Pattern: Impaired Gait Pattern: Step-through pattern;Trunk flexed;Narrow base of support;Ataxic;Decreased stride length   See FIM for current functional status  Therapy/Group: Individual Therapy  Vista Deck 08/16/2013, 2:54 PM

## 2013-08-16 NOTE — Progress Notes (Signed)
Speech Language Pathology Daily Session Note  Patient Details  Name: Keith [name] MRN: [id number] Date of Birth: 15-Dec-1945  Today's Date: 08/16/2013 Time: 1005-1045 Time Calculation (min): 40 min  Short Term Goals: Week 2: SLP Short Term Goal 1 (Week 2): Pt will perform pharyngeal strengthening exercises with Min cues SLP Short Term Goal 2 (Week 2): Pt will consume 4oz of puree with Min verbal cues to utilite recommended safe swallow strategeis  SLP Short Term Goal 3 (Week 2): Pt will utilize speech intelligibility strategies with Supervision level verbal cues SLP Short Term Goal 4 (Week 2): Pt will perform oral motor exercises with Min cues  Skilled Therapeutic Interventions: Skilled treatment session focused on addressing dysphagia goals.  SLP facilitated session with trials of Dys.1 textures and honey-thick liquids via spoon with Supervision assist for use of safe swallow compensatory strategies.  Patient with increased timeliness of swallow today and report of ease with swallowing with no overt s/s of aspiration.  As a result, recommend a repeat objective assessment prior to final decision regarding PEG placement.     FIM:  Comprehension Comprehension Mode: Auditory Comprehension: 6-Follows complex conversation/direction: With extra time/assistive device Expression Expression Mode: Verbal Expression: 5-Expresses complex 90% of the time/cues < 10% of the time Social Interaction Social Interaction: 5-Interacts appropriately 90% of the time - Needs monitoring or encouragement for participation or interaction. Problem Solving Problem Solving: 5-Solves complex 90% of the time/cues < 10% of the time Memory Memory: 5-Recognizes or recalls 90% of the time/requires cueing < 10% of the time  Pain Pain Assessment Pain Assessment: No/denies pain Pain Score: 0-No pain  Therapy/Group: Individual Therapy  Charlane Ferretti., CCC-SLP [phone]  Keith [name] 08/16/2013, 2:08 PM

## 2013-08-17 ENCOUNTER — Encounter (HOSPITAL_COMMUNITY): Payer: Medicare Other | Admitting: Occupational Therapy

## 2013-08-17 ENCOUNTER — Inpatient Hospital Stay (HOSPITAL_COMMUNITY): Payer: Medicare Other | Admitting: Speech Pathology

## 2013-08-17 ENCOUNTER — Inpatient Hospital Stay (HOSPITAL_COMMUNITY): Payer: Medicare Other | Admitting: Rehabilitation

## 2013-08-17 ENCOUNTER — Inpatient Hospital Stay (HOSPITAL_COMMUNITY): Payer: Medicare Other

## 2013-08-17 LAB — GLUCOSE, CAPILLARY
Glucose-Capillary: 163 mg/dL — ABNORMAL HIGH (ref 70–99)
Glucose-Capillary: 141 mg/dL — ABNORMAL HIGH (ref 70–99)
Glucose-Capillary: 159 mg/dL — ABNORMAL HIGH (ref 70–99)

## 2013-08-17 MED ORDER — SODIUM CHLORIDE 0.45 % IV SOLN
INTRAVENOUS | Status: DC
  Administered 2013-08-17 – 2013-08-19 (×2): via INTRAVENOUS

## 2013-08-17 MED ORDER — CLOPIDOGREL BISULFATE 75 MG PO TABS
75.0000 mg | ORAL_TABLET | Freq: Every day | ORAL | Status: DC
  Administered 2013-08-18 – 2013-08-20 (×3): 75 mg via ORAL
  Filled 2013-08-17 (×4): qty 1

## 2013-08-17 NOTE — Plan of Care (Signed)
Problem: RH PAIN MANAGEMENT Goal: RH STG PAIN MANAGED AT OR BELOW PT'S PAIN GOAL 3 or less on scale 0-10  Outcome: Not Progressing Pain 4-6 with meds  Problem: RH KNOWLEDGE DEFICIT Goal: RH STG INCREASE KNOWLEDGE OF DYSPHAGIA/FLUID INTAKE Outcome: Progressing D1 Honey crush meds in puree

## 2013-08-17 NOTE — Progress Notes (Signed)
Speech Language Pathology Daily Session Note  Patient Details  Name: Keith [name] MRN: [id number] Date of Birth: 05-Jan-1946  Today's Date: 08/17/2013 Time: 1135-1210 Time Calculation (min): 35 min  Short Term Goals: Week 2: SLP Short Term Goal 1 (Week 2): Pt will perform pharyngeal strengthening exercises with Min cues SLP Short Term Goal 2 (Week 2): Pt will consume 4oz of puree with Min verbal cues to utilite recommended safe swallow strategeis  SLP Short Term Goal 3 (Week 2): Pt will utilize speech intelligibility strategies with Supervision level verbal cues SLP Short Term Goal 4 (Week 2): Pt will consume Dys.1 textures and honey-thick liquids via teaspoon with Min cues to recall and utilize safe swallow strategies.  Skilled Therapeutic Interventions: Skilled treatment session focused on addressing dysphagia goals.  SLP facilitated session with lunch tray set-up and Min verbal cues to recall and utilize safe swallow strategies during duration of meal.  Patient consumed 25% of Dys.1 textures and honey-thick liquids via spoon with no overt s/s of aspiration.  Recommend to continue with current plan of care.    FIM:  Comprehension Comprehension Mode: Auditory Comprehension: 5-Understands complex 90% of the time/Cues < 10% of the time Expression Expression Mode: Verbal Expression: 5-Expresses complex 90% of the time/cues < 10% of the time Social Interaction Social Interaction: 5-Interacts appropriately 90% of the time - Needs monitoring or encouragement for participation or interaction. Problem Solving Problem Solving: 5-Solves basic 90% of the time/requires cueing < 10% of the time Memory Memory: 5-Recognizes or recalls 90% of the time/requires cueing < 10% of the time FIM - Eating Eating Activity: 5: Needs verbal cues/supervision  Pain Pain Assessment Pain Assessment: No/denies pain  Therapy/Group: Individual Therapy  Keith [name].,  CCC-SLP [phone]  Keith [name] 08/17/2013, 2:08 PM

## 2013-08-17 NOTE — Procedures (Signed)
Objective Swallowing Evaluation: Modified Barium Swallowing Study  Patient Details  Name: Keith [name] MRN: [id number] Date of Birth: 07-Oct-1945  Today's Date: 08/17/2013 Time: 6962-9528 Time Calculation (min): 25 min  Past Medical History:  Past Medical History  Diagnosis Date  . Diabetes mellitus without complication   . Hypertension    Past Surgical History:  Past Surgical History  Procedure Laterality Date  . Prostate surgery    . Knee surgery     HPI:  67 y.o. 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  MBS ordered due to concerns for dysphagia.  Pt admits to h/o "sore throat" and transient problems swallowing in the last few weeks.  Pt is a previous smoker, CXR negative 11/29.  Pt admitted to [facility] 08/16/13 and has been participating in therapies. Repeat objective study ordered today to determine readiness for p.o.       Recommendation/Prognosis  Clinical Impression:  Patient presents with functional improvements as compared to previous objective assessment.  He continues to demonstrate a mild oral and moderate sensorimotor, pharyngeal-cervical esophageal phase dysphagia.  Patient's dysphagia is characterized by generalized weakness, slight premature spillage of boluses into vallecular space and a delayed swallow initiation.  Moderately severe residuals observed, which patient cleared with use of multiple swallows; cued throat clears were effective at reducing trace shallow penetration of honey-thick liquids.  Despite functional improvements nectar-thick liquid trails via teaspoon were silently aspirated.  SLP educated patient regarding need for strict use of safe swallow compensatory strategies with initiation of a conservative Dys.1 texture and honey-thick liquid via teaspoon diet.    Dysphagia Diagnosis: Mild oral phase dysphagia;Moderate  pharyngeal phase dysphagia;Moderate cervical esophageal phase dysphagia Swallow Evaluation Recommendations Diet Recommendations: Dysphagia 1 (Puree);Honey-thick liquid Liquid Administration via: No straw;Spoon Medication Administration: Crushed with puree Supervision: Patient able to self feed;Full supervision/cueing for compensatory strategies Compensations: Slow rate;Small sips/bites;Multiple dry swallows after each bite/sip;Clear throat intermittently;Effortful swallow Postural Changes and/or Swallow Maneuvers: Out of bed for meals;Seated upright 90 degrees;Upright 30-60 min after meal Oral Care Recommendations: Oral care BID Other Recommendations: Order thickener from pharmacy;Prohibited food (jello, ice cream, thin soups);Remove water pitcher;Have oral suction available Follow up Recommendations: Skilled Nursing facility;24 hour supervision/assistance Prognosis Prognosis for Safe Diet Advancement: Good Barriers to Reach Goals: Severity of dysphagia Individuals Consulted Consulted and Agree with Results and Recommendations: Patient;PA;RN  SLP Assessment/Plan  See Care Plan   Short Term Goals: Week 2: SLP Short Term Goal 1 (Week 2): Pt will perform pharyngeal strengthening exercises with Min cues SLP Short Term Goal 2 (Week 2): Pt will consume 4oz of puree with Min verbal cues to utilite recommended safe swallow strategeis  SLP Short Term Goal 3 (Week 2): Pt will utilize speech intelligibility strategies with Supervision level verbal cues SLP Short Term Goal 4 (Week 2): Pt will consume Dys.1 textures and honey-thick liquids via teaspoon with Min cues to recall and utilize safe swallow strategies.  General:  Date of Onset: 08/01/13 HPI: 67 y.o. 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  MBS ordered due to concerns for dysphagia.  Pt admits to  h/o "sore throat" and transient problems swallowing in the last few weeks.  Pt is a previous smoker, CXR negative 11/29.  Pt admitted  to [facility] 08/16/13 and has been participating in therapies. Repeat objective study ordered today to determine readiness for p.o.   Type of Study: Modified Barium Swallowing Study Previous Swallow Assessment: MBS 11/30 and 12/9 with recommendations for NPO Diet Prior to this Study: Dysphagia 1 (puree);Honey-thick liquids Temperature Spikes Noted: No Respiratory Status: Room air Behavior/Cognition: Alert;Cooperative;Pleasant mood;Hard of hearing Oral Cavity - Dentition: Adequate natural dentition Oral Motor / Sensory Function: Impaired - see Bedside swallow eval Self-Feeding Abilities: Able to feed self Patient Positioning: Upright in chair Baseline Vocal Quality: Low vocal intensity Volitional Cough: Weak Volitional Swallow: Able to elicit Anatomy: Within functional limits Pharyngeal Secretions: Not observed secondary MBS  Reason for Referral:   Objectively evaluate swallow function  Oral Phase Oral Preparation/Oral Phase Oral Phase: Impaired Oral - Honey Oral - Honey Teaspoon: Within functional limits Oral - Nectar Oral - Nectar Teaspoon: Within functional limits Oral - Thin Oral - Thin Teaspoon: Not tested Oral - Solids Oral - Puree: Lingual/palatal residue Oral - Mechanical Soft: Lingual/palatal residue;Reduced posterior propulsion Oral - Regular: Not tested Pharyngeal Phase  Pharyngeal Phase Pharyngeal Phase: Impaired Pharyngeal - Honey Pharyngeal - Honey Teaspoon: Pharyngeal residue - valleculae;Pharyngeal residue - pyriform sinuses;Reduced tongue base retraction;Reduced laryngeal elevation;Reduced anterior laryngeal mobility;Reduced epiglottic inversion;Reduced pharyngeal peristalsis;Reduced airway/laryngeal closure;Premature spillage to valleculae;Delayed swallow initiation;Penetration/Aspiration during swallow Penetration/Aspiration details  (honey teaspoon): Material does not enter airway;Material enters airway, remains ABOVE vocal cords then ejected out;Material enters airway, remains ABOVE vocal cords and not ejected out (cued throat clears removed most of penetrates) Pharyngeal - Nectar Pharyngeal - Nectar Teaspoon: Delayed swallow initiation;Premature spillage to valleculae;Reduced epiglottic inversion;Reduced anterior laryngeal mobility;Reduced pharyngeal peristalsis;Reduced airway/laryngeal closure;Reduced laryngeal elevation;Reduced tongue base retraction;Penetration/Aspiration before swallow;Trace aspiration;Pharyngeal residue - pyriform sinuses;Pharyngeal residue - valleculae Penetration/Aspiration details (nectar teaspoon): Material enters airway, CONTACTS cords and not ejected out (cued throat clears and cough not effective at removing penetrates; penetraes became aspirates) Pharyngeal - Thin Pharyngeal - Thin Teaspoon: Not tested Pharyngeal - Solids Pharyngeal - Puree: Premature spillage to valleculae;Delayed swallow initiation;Reduced pharyngeal peristalsis;Reduced epiglottic inversion;Reduced anterior laryngeal mobility;Reduced laryngeal elevation;Reduced tongue base retraction;Pharyngeal residue - valleculae;Pharyngeal residue - pyriform sinuses;Pharyngeal residue - posterior pharnyx;Compensatory strategies attempted (Comment) (cued chin tuck not effective at reducing residue; cues for multiple swallows effective) Penetration/Aspiration details (puree): Material does not enter airway Pharyngeal - Mechanical Soft: Delayed swallow initiation;Premature spillage to valleculae;Reduced epiglottic inversion;Reduced pharyngeal peristalsis;Reduced anterior laryngeal mobility;Reduced laryngeal elevation;Reduced tongue base retraction;Pharyngeal residue - valleculae;Pharyngeal residue - pyriform sinuses;Pharyngeal residue - posterior pharnyx;Compensatory strategies attempted (Comment) (cues for multiple swallows effective) Pharyngeal -  Regular: Not tested Cervical Esophageal Phase  Cervical Esophageal Phase - Comment Cervical Esophageal Comment: continued decreased UES opening due to poor laryngeal elevation which is reduced with cues for hard multiple swallows   G-Codes   Fae Pippin, M.A., CCC-SLP [phone]  Markella Dao 08/17/2013, 10:05 AM

## 2013-08-17 NOTE — Progress Notes (Signed)
Physical Therapy Session Note  Patient Details  Name: Keith [name] MRN: [id number] Date of Birth: 1946/04/12  Today's Date: 08/17/2013 Time: 1135-1210 Time Calculation (min): 35 min  Short Term Goals: Week 2:  PT Short Term Goal 1 (Week 2): =LTG's due to ELOS  Skilled Therapeutic Interventions/Progress Updates:   Pt received sitting in w/c in room, agreeable to therapy this afternoon.  Focus of session was high level gait in tight spaces to address coordination and balance deficits with and without RW in gift shop.  Ambulated to/from gift shop >150' at min/guard level with use of RW to improve gait quality, as well as gait endurance.  Performed ambulation through gift shop with RW initially at min/guard assist with cues for taking turns slowly and increased hip/knee flex, esp on LLE to ensure clearing of extremity.  Note surface is carpeted, also making gait somewhat more difficult.  Performed gait through gift shop again without RW at min assist.  Note pt to have 2 mild LOB, however was able to use ankle and hip strategies effectively in order to self correct balance.  He also does well stopping gait in order to correct balance before continuing.  Once back in therapy gym, performed standing balance horse shoe toss while standing on balance beam in order to challenge ankle and hip strategy.  Requires min assist at all times today (no mod assist) as pt was able to self correct all LOB with ankle and hip adjustments.  Progressed to performing backwards and side to side ambulation for increased balance and hip strengthening x 80' x 2 at min assist level with cues for increased step width and length, esp with backwards walking.  Performed side stepping while pushing bosu ball x 25' x 2 in order to increase weight shift and weight bearing through LLE and hip abd strength bilaterally.  Again able to perform at min/guard level for steadying.  Ended session with standing on rocker board in vertical position to  continue to challenge ankle and hip strategies with and without UE support.  Pt initially requires mod assist to gain balance, however was able to stand on board without UE support at min/guard assist and could even maintain balance while given forward and backward perturbations, also at min assist to min/guard assist.  Ambulated back to room without AD at min/guard assist to min assist (esp when turning) with continued cues for decreased speed when turning for safety, increased step width (again esp around turns) and to maintain upright posture throughout.  Pt left in room in w/c with family/friends present.  All needs in reach,    Therapy Documentation Precautions:  Precautions Precautions: Fall Precaution Comments: NPO, NG feeding tube in place, ataxia Restrictions Weight Bearing Restrictions: No General:   Vital Signs: Therapy Vitals Temp: 97.8 F (36.6 C) Temp src: Oral Pulse Rate: 60 Resp: 18 BP: 139/76 mmHg Patient Position, if appropriate: Sitting Oxygen Therapy SpO2: 98 % Pain: Pain Assessment Pain Assessment: 0-10 Pain Score: 4  Pain Location: Head Pain Descriptors / Indicators: Headache Pain Onset: On-going Patients Stated Pain Goal: 2 Pain Intervention(s): Medication (See eMAR) Mobility:   Locomotion :    Trunk/Postural Assessment :    Balance:   Exercises:   Other Treatments:    See FIM for current functional status  Therapy/Group: Individual Therapy  Vista Deck 08/17/2013, 3:16 PM

## 2013-08-17 NOTE — Progress Notes (Signed)
Patient ID: Keith [name], male   DOB: 08/02/1946, 67 y.o.   MRN: [id number] 67 y.o. male with history of DM, HTN, who was admitted on 07/31/13 with left facial droop with dysarthria and one week history of imbalance as diplopia X 2 days. MRI/MRA brain done revealing two small acute brainstem infarcts in-right paracentral pons, and central to left paracentral medulla possibly involving left pyramid, no stenosis. 2D echo with EF 55-60% and grade 1 diastolic dysfunction. Carotid dopplers with 1-39% ICA stenosis. Neurology services consulted maintained on aspirin and Plavix therapy for CVA prophylaxis. Subcutaneous heparin added for DVT prophylaxis.Marland Kitchen MBS done 08/01/13 alternative nutritional means and NPO recommended    Subjective/Complaints: NPO, but pt feels he is swallowing secretions better  Repeat MBS for this am Per IR needs to be off plavix until  Wed if PEG is to be placed  Frontal HA improved with IV toradol Review of Systems - otherwise negative   Objective: Vital Signs: Blood pressure 134/57, pulse 55, temperature 98 F (36.7 C), temperature source Oral, resp. rate 18, weight 76.9 kg (169 lb 8.5 oz), SpO2 96.00%. Dg Abd Portable 1v  08/15/2013   CLINICAL DATA:  NG placement  EXAM: PORTABLE ABDOMEN - 1 VIEW  COMPARISON:  08/03/2013  FINDINGS: The previously seen feeding catheter is been removed in a nasogastric catheter placed. It extends into the distal esophagus but in short of the gastroesophageal junction. Fecal material is noted throughout the colon. No mass or abnormal calcifications are identified. Postsurgical changes are seen in the pelvis.  IMPRESSION: Nasogastric catheter within the distal esophagus. This should be advanced into the stomach.   Electronically Signed   By: Alcide Clever M.D.   On: 08/15/2013 15:33   Results for orders placed during the hospital encounter of 08/05/13 (from the past 72 hour(s))  GLUCOSE, CAPILLARY     Status: Abnormal   Collection Time    08/14/13  11:29 AM      Result Value Range   Glucose-Capillary 219 (*) 70 - 99 mg/dL   Comment 1 Notify RN    GLUCOSE, CAPILLARY     Status: Abnormal   Collection Time    08/14/13  4:25 PM      Result Value Range   Glucose-Capillary 160 (*) 70 - 99 mg/dL   Comment 1 Documented in Chart     Comment 2 Notify RN    GLUCOSE, CAPILLARY     Status: Abnormal   Collection Time    08/14/13  7:59 PM      Result Value Range   Glucose-Capillary 177 (*) 70 - 99 mg/dL   Comment 1 Notify RN    GLUCOSE, CAPILLARY     Status: Abnormal   Collection Time    08/15/13 12:10 AM      Result Value Range   Glucose-Capillary 200 (*) 70 - 99 mg/dL   Comment 1 Notify RN    GLUCOSE, CAPILLARY     Status: Abnormal   Collection Time    08/15/13  4:00 AM      Result Value Range   Glucose-Capillary 261 (*) 70 - 99 mg/dL   Comment 1 Notify RN    GLUCOSE, CAPILLARY     Status: Abnormal   Collection Time    08/15/13  7:36 AM      Result Value Range   Glucose-Capillary 141 (*) 70 - 99 mg/dL   Comment 1 Notify RN    GLUCOSE, CAPILLARY     Status: Abnormal  Collection Time    08/15/13 11:48 AM      Result Value Range   Glucose-Capillary 163 (*) 70 - 99 mg/dL   Comment 1 Notify RN    GLUCOSE, CAPILLARY     Status: Abnormal   Collection Time    08/15/13  4:02 PM      Result Value Range   Glucose-Capillary 170 (*) 70 - 99 mg/dL   Comment 1 Notify RN    GLUCOSE, CAPILLARY     Status: Abnormal   Collection Time    08/15/13  8:35 PM      Result Value Range   Glucose-Capillary 185 (*) 70 - 99 mg/dL   Comment 1 Notify RN    GLUCOSE, CAPILLARY     Status: Abnormal   Collection Time    08/16/13 12:47 AM      Result Value Range   Glucose-Capillary 186 (*) 70 - 99 mg/dL   Comment 1 Notify RN    GLUCOSE, CAPILLARY     Status: Abnormal   Collection Time    08/16/13  4:03 AM      Result Value Range   Glucose-Capillary 180 (*) 70 - 99 mg/dL   Comment 1 Notify RN    BASIC METABOLIC PANEL     Status: Abnormal    Collection Time    08/16/13  5:45 AM      Result Value Range   Sodium 132 (*) 135 - 145 mEq/L   Potassium 4.5  3.5 - 5.1 mEq/L   Chloride 94 (*) 96 - 112 mEq/L   CO2 26  19 - 32 mEq/L   Glucose, Bld 197 (*) 70 - 99 mg/dL   BUN 18  6 - 23 mg/dL   Creatinine, Ser 1.61  0.50 - 1.35 mg/dL   Calcium 9.6  8.4 - 09.6 mg/dL   GFR calc non Af Amer 89 (*) >90 mL/min   GFR calc Af Amer >90  >90 mL/min   Comment: (NOTE)     The eGFR has been calculated using the CKD EPI equation.     This calculation has not been validated in all clinical situations.     eGFR's persistently <90 mL/min signify possible Chronic Kidney     Disease.  GLUCOSE, CAPILLARY     Status: Abnormal   Collection Time    08/16/13  7:35 AM      Result Value Range   Glucose-Capillary 188 (*) 70 - 99 mg/dL   Comment 1 Notify RN    GLUCOSE, CAPILLARY     Status: Abnormal   Collection Time    08/16/13 12:03 PM      Result Value Range   Glucose-Capillary 238 (*) 70 - 99 mg/dL   Comment 1 Notify RN    GLUCOSE, CAPILLARY     Status: Abnormal   Collection Time    08/16/13  4:21 PM      Result Value Range   Glucose-Capillary 124 (*) 70 - 99 mg/dL  GLUCOSE, CAPILLARY     Status: Abnormal   Collection Time    08/16/13  8:45 PM      Result Value Range   Glucose-Capillary 158 (*) 70 - 99 mg/dL  GLUCOSE, CAPILLARY     Status: Abnormal   Collection Time    08/16/13 11:46 PM      Result Value Range   Glucose-Capillary 139 (*) 70 - 99 mg/dL  GLUCOSE, CAPILLARY     Status: Abnormal   Collection Time  08/17/13  4:20 AM      Result Value Range   Glucose-Capillary 163 (*) 70 - 99 mg/dL  GLUCOSE, CAPILLARY     Status: Abnormal   Collection Time    08/17/13  7:36 AM      Result Value Range   Glucose-Capillary 202 (*) 70 - 99 mg/dL   Comment 1 Notify RN       HEENT: normal Cardio: RRR and no murmur Resp: CTA B/L and unlabored GI: BS positive and non distended Extremity:  Pulses positive and No Edema Skin:    Intact Neuro: a little slow this am, Cranial Nerve Abnormalities Left central 7, Abnormal Motor 3-/5 LUE, 3-/5 LLE, 5/5 on  Right side, Abnormal FMC Ataxic/ dec FMC and Dysarthric. Severe dymetria left FNF and Left Heel to Shin Musc/Skel:  Normal GEN: NAD   Assessment/Plan: 1. Functional deficits secondary to Right pontine and Left medullary infarcts which require 3+ hours per day of interdisciplinary therapy in a comprehensive inpatient rehab setting. Physiatrist is providing close team supervision and 24 hour management of active medical problems listed below. Physiatrist and rehab team continue to assess barriers to discharge/monitor patient progress toward functional and medical goals. FIM: FIM - Bathing Bathing Steps Patient Completed: Chest;Right Arm;Left Arm;Abdomen;Front perineal area;Buttocks;Right upper leg;Left upper leg;Right lower leg (including foot);Left lower leg (including foot) Bathing: 4: Steadying assist  FIM - Upper Body Dressing/Undressing Upper body dressing/undressing steps patient completed: Pull shirt over trunk;Thread/unthread right sleeve of front closure shirt/dress;Thread/unthread left sleeve of front closure shirt/dress;Pull shirt around back of front closure shirt/dress;Button/unbutton shirt Upper body dressing/undressing: 5: Supervision: Safety issues/verbal cues FIM - Lower Body Dressing/Undressing Lower body dressing/undressing steps patient completed: Thread/unthread right underwear leg;Thread/unthread left underwear leg;Pull underwear up/down;Thread/unthread right pants leg;Thread/unthread left pants leg;Pull pants up/down;Fasten/unfasten pants;Don/Doff right sock;Don/Doff left sock;Don/Doff right shoe;Don/Doff left shoe;Fasten/unfasten right shoe;Fasten/unfasten left shoe Lower body dressing/undressing: 5: Set-up assist to: Don/Doff AFO/prosthesis/orthosis  FIM - Toileting Toileting steps completed by patient: Adjust clothing prior to toileting;Performs  perineal hygiene;Adjust clothing after toileting Toileting Assistive Devices: Grab bar or rail for support Toileting: 0: Activity did not occur  FIM - Diplomatic Services operational officer Devices: Grab bars Toilet Transfers: 0-Activity did not occur  FIM - Banker Devices: Bed rails;Arm rests Bed/Chair Transfer: 4: Bed > Chair or W/C: Min A (steadying Pt. > 75%);4: Chair or W/C > Bed: Min A (steadying Pt. > 75%);5: Supine > Sit: Supervision (verbal cues/safety issues);5: Sit > Supine: Supervision (verbal cues/safety issues)  FIM - Locomotion: Wheelchair Distance: 155 Locomotion: Wheelchair: 0: Activity did not occur FIM - Locomotion: Ambulation Locomotion: Ambulation Assistive Devices:  (no AD) Ambulation/Gait Assistance: 4: Min assist Locomotion: Ambulation: 4: Travels 150 ft or more with minimal assistance (Pt.>75%)  Comprehension Comprehension Mode: Auditory Comprehension: 6-Follows complex conversation/direction: With extra time/assistive device  Expression Expression Mode: Verbal Expression: 5-Expresses complex 90% of the time/cues < 10% of the time  Social Interaction Social Interaction: 5-Interacts appropriately 90% of the time - Needs monitoring or encouragement for participation or interaction.  Problem Solving Problem Solving: 5-Solves complex 90% of the time/cues < 10% of the time  Memory Memory: 5-Recognizes or recalls 90% of the time/requires cueing < 10% of the time  Medical Problem List and Plan:  1. Thrombotic left medullary, right paracentral pontine infarct  2. DVT Prophylaxis/Anticoagulation: Subcutaneous heparin. Monitor platelet counts and any signs of bleeding  3. Pain Management: Tylenol as needed for headache--this has been effective 4. Mood: Zoloft  50 mg daily, Ativan 0.5 mg every 4 as needed anxiety , held secondary to NPO 5. Neuropsych: This patient is capable of making decisions on his own behalf.   6. Dysphagia. Patient currently n.p.o.  Panda out.  Will probably need PEG, but pt subjectively feel swallow has improved.  IR can do this on Wed 12/17.  Will ask SLP to reassess this am, if no better reinsert PANDA under fluoro 7. Hypertension. Norvasc 5 mg daily.  Borderline controlled at present  8. Hyperlipidemia. Lipitor  9. Diabetes mellitus with peripheral neuropathy. Controlled Hemoglobin A1c 7.4. Continue sliding scale insulin, hold Glucophage 500 mg to twice a day secondary to NPO status .     LOS (Days) 12 A FACE TO FACE EVALUATION WAS PERFORMED  Janaiya Beauchesne E 08/17/2013, 8:04 AM

## 2013-08-17 NOTE — Progress Notes (Signed)
Occupational Therapy Session Note  Patient Details  Name: Keith [name] MRN: [id number] Date of Birth: Feb 01, 1946  Today's Date: 08/17/2013 Time: 1003-1102 Time Calculation (min): 59 min  Short Term Goals: Week 2:  OT Short Term Goal 1 (Week 2): Pt will perform bathing sit to stand with supervision. OT Short Term Goal 2 (Week 2): Pt will perform dressing with supervision sit to stand. OT Short Term Goal 3 (Week 2): Pt will perform toileting and toilet transfers with supervision. OT Short Term Goal 4 (Week 2): Pt will use the LUE at a diminshed level for selfcare tasks with independence.  Skilled Therapeutic Interventions/Progress Updates:    Pt ambulated around the room to gather clothing, towels, and washcloths for shower with min assist and without use of an assistive device.  Pt performed shower sit to stand on the seat with close supervision.  He utilized the grab bar in the shower with close supervision during standing while washing peri area and rinsing off.  Transferred back out to the wheelchair in front of the sink for dressing tasks.  Pt was able to stand with min guard assist to donn button up shirt, including fastening his buttons.  He needed assistance with donning knee high TEDS however.  Pt finished session by standing to perform his shaving, brushing his teeth, and combing his hair with min guard assist.    Therapy Documentation Precautions:  Precautions Precautions: Fall Precaution Comments: NPO, NG feeding tube in place, ataxia Restrictions Weight Bearing Restrictions: No  Pain: Pain Assessment Pain Assessment: 0-10 Pain Score: 8  Pain Location: Head Pain Orientation: Left Pain Radiating Towards: neck Pain Onset: On-going Patients Stated Pain Goal: 5 Pain Intervention(s): Medication (See eMAR) ADL: See FIM for current functional status  Therapy/Group: Individual Therapy  Martine Bleecker OTR/L 08/17/2013, 11:28 AM

## 2013-08-17 NOTE — Plan of Care (Signed)
Problem: RH KNOWLEDGE DEFICIT Goal: RH STG INCREASE KNOWLEDGE OF DIABETES Patient will be able to verbalize understanding of normal blood sugar, the important of complaint with medications, diet, exercise.  Needs reinforcement Goal: RH STG INCREASE KNOWLEDGE OF HYPERTENSION Patient will be able to verbalize understanding of normal BP and the important of complaint with taking medications and diet.  Needs reinforcement

## 2013-08-18 ENCOUNTER — Inpatient Hospital Stay (HOSPITAL_COMMUNITY): Payer: Medicare Other | Admitting: Rehabilitation

## 2013-08-18 ENCOUNTER — Inpatient Hospital Stay (HOSPITAL_COMMUNITY): Payer: Medicare Other | Admitting: Speech Pathology

## 2013-08-18 ENCOUNTER — Encounter (HOSPITAL_COMMUNITY): Payer: Medicare Other | Admitting: Occupational Therapy

## 2013-08-18 LAB — GLUCOSE, CAPILLARY
Glucose-Capillary: 121 mg/dL — ABNORMAL HIGH (ref 70–99)
Glucose-Capillary: 146 mg/dL — ABNORMAL HIGH (ref 70–99)
Glucose-Capillary: 134 mg/dL — ABNORMAL HIGH (ref 70–99)
Glucose-Capillary: 198 mg/dL — ABNORMAL HIGH (ref 70–99)

## 2013-08-18 MED ORDER — ENSURE PUDDING PO PUDG: 1.0000 | Freq: Three times a day (TID) | ORAL | Status: DC | PRN

## 2013-08-18 MED ORDER — GLUCERNA SHAKE PO LIQD
237.0000 mL | Freq: Two times a day (BID) | ORAL | Status: DC
  Administered 2013-08-18 – 2013-08-20 (×4): 237 mL via ORAL

## 2013-08-18 NOTE — Progress Notes (Signed)
Occupational Therapy Session Note  Patient Details  Name: Keith [name] MRN: [id number] Date of Birth: 1945/11/26  Today's Date: 08/18/2013 Time: 7829-5621 Time Calculation (min): 41 min  Short Term Goals: Week 2:  OT Short Term Goal 1 (Week 2): Pt will perform bathing sit to stand with supervision. OT Short Term Goal 2 (Week 2): Pt will perform dressing with supervision sit to stand. OT Short Term Goal 3 (Week 2): Pt will perform toileting and toilet transfers with supervision. OT Short Term Goal 4 (Week 2): Pt will use the LUE at a diminshed level for selfcare tasks with independence.  Skilled Therapeutic Interventions/Progress Updates:    Pt performed bathing and dressing during session sit to stand at the sink.  Began by gathering supplies without use of assistive device, ambulating around the room.  Pt still requiring min assist level for dynamic balance while reaching down in his lower shelves as well as when making turns in standing.  Overall close supervision level for bathing and dressing sit to stand this session.  Needs increased time to perform FM tasks such as buttoning his shirts and tying his shoes.    Therapy Documentation Precautions:  Precautions Precautions: Fall Precaution Comments: NPO, NG feeding tube in place, ataxia Restrictions Weight Bearing Restrictions: No  Pain: Pain Assessment Pain Assessment: No/denies pain ADL: See FIM for current functional status  Therapy/Group: Individual Therapy  Michelina Mexicano OTR/L 08/18/2013, 12:00 PM

## 2013-08-18 NOTE — Progress Notes (Signed)
NUTRITION FOLLOW UP  Intervention:   Discontinue Jevity 1.2 as pt no longer has enteral access. Add Ensure Pudding po prn TID, each supplement provides 170 kcal and 4 grams of protein - may use for medpass. Add Glucerna Shake po BID, each supplement provides 220 kcal and 10 grams of protein. Please thicken to appropriate consistency. RD to continue to follow nutrition care plan.  NUTRITION DIAGNOSIS:  Inadequate oral intake related to inability to eat, dysphagia as evidenced by limited oral intake.  Goal:  Weights - monitor trends. Ongoing.  Wt is stable Diet advancement - met. Resolved. Intake to meet >90% of estimated nutrition needs.  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   Plan to d/c to SNF when rehab completed.  Panda tube removed on 12/14 secondary to clogging issues, was unable to be replaced.  MBSS completed 12/16 with recommendations for Dysphagia 1 diet with Honey-Thickened liquids. Pt consumed 25% of lunch yesterday with SLP. Pt ate 50% of breakfast this morning. He understands the importance of PO intake to prevent feeding tube placement. Agreeable to drinking Glucerna Shakes to help meet nutritional needs. Denies further nutrition concerns or questions at this time.  Height: Ht Readings from Last 1 Encounters:  07/31/13 6\' 1"  (1.854 m)    Weight Status:   Wt Readings from Last 1 Encounters:  08/18/13 166 lb 0.1 oz (75.3 kg)  Admit wt 172 lb - wt trending down  Re-estimated needs:  Kcal: 1900-2100 Protein: 95-105g Fluid: >2.0 L/day  Skin: intact  Diet Order: Dysphagia 1; Honey Thickened Liquids   Intake/Output Summary (Last 24 hours) at 08/18/13 1026 Last data filed at 08/18/13 0127  Gross per 24 hour  Intake    480 ml  Output    275 ml  Net    205 ml    Last BM: 12/16   Labs:   Recent Labs Lab 08/16/13 0545  NA 132*  K 4.5  CL 94*  CO2 26  BUN 18  CREATININE 0.82  CALCIUM 9.6   GLUCOSE 197*    CBG (last 3)   Recent Labs  08/18/13 08/18/13 0404 08/18/13 0913  GLUCAP 134* 121* 146*    Scheduled Meds: . amLODipine  5 mg Per NG tube Daily  . aspirin  300 mg Rectal Daily   Or  . aspirin  325 mg Oral Daily  . atorvastatin  80 mg Per NG tube q1800  . clopidogrel  75 mg Oral Q breakfast  . fluticasone  2 spray Each Nare BID  . folic acid  1 mg Per Tube Daily  . free water  200 mL Per Tube Q8H  . heparin  5,000 Units Subcutaneous Q8H  . insulin aspart  0-15 Units Subcutaneous Q4H  . metFORMIN  500 mg Oral BID WC  . multivitamin with minerals  1 tablet Per Tube Daily  . sertraline  50 mg Oral Daily    Continuous Infusions: . sodium chloride Stopped (08/18/13 0727)  . feeding supplement (JEVITY 1.2 CAL) 1,000 mL (08/14/13 0700)    Jarold Motto MS, RD, LDN Pager: [phone] After-hours pager: [phone]

## 2013-08-18 NOTE — Progress Notes (Signed)
Physical Therapy Session Note  Patient Details  Name: Masiyah Engen MRN: [id number] Date of Birth: 1946/08/22  Today's Date: 08/18/2013 Time: 1330-1430 and 1523-1600 Time Calculation (min): 60 min and 37 mins  Short Term Goals: Week 2:  PT Short Term Goal 1 (Week 2): =LTG's due to ELOS  Skilled Therapeutic Interventions/Progress Updates:   First PM session:  Pt received sitting in w/c in room, agreeable to therapy this afternoon.  Had pt self propel x 155' x 2 with BLEs only in order to increase BLE strengthening and also to work on increased coordination with reciprocal LE movements.  Able to perform at supervision with min cues for safety.  Once in gym performed Wii balance activity on balance board to work on increased hip strategy.  Pt able to tolerate activity for approx 20 mins with seated rest breaks in between.   Performed side stepping and tapping to cones activity on mat surface to work on increase weight shifting and weight bearing through LLE, coordination, and balance with SLS.  Pt requires min to mod assist at times due to LOB.  Pt able to correct most of time, however if COG too far outside of BOS, then he was unable to pull in step strategy to correct balance.  Ended session with performing gait without RW through tight spaces and around objects at min assist level.  Performed 30' x 2 reps with continued cues for slower gait speed and increased step width to prevent scissoring.  Once back in room, supervised pt while he consumed pureed peaches with min cues for small bites, going slow and to perform 2-3 dry swallows before continuing.  Pt did well with clearing throat in between bites and only requires very min cues for this.  Pt states he enjoys puree peaches due to wet consistency and ease of swallowing.  Pt left in room with all needs in reach.   Second PM session:  Pt received sitting in w/c in room, agreeable to therapy this afternoon.  Ambulated to/from therapy gym without RW.  See  details below.  Once in gym, administered BERG balance test.   Scored 30/56, placing pt at significant risk for fall.  Discussed results and things to improve on.  See details below.  Ended session with rocker board activity in // bars in vertical position to work on ankle and hip strategy.  Pt did somewhat better today than yesterday, however continues to require min assist to prevent LOB.  Pt ambulated back to room and remained in w/c with all needs in reach.   Therapy Documentation Precautions:  Precautions Precautions: Fall Precaution Comments: NPO, NG feeding tube in place, ataxia Restrictions Weight Bearing Restrictions: No   Vital Signs: Therapy Vitals Temp: 98 F (36.7 C) Temp src: Oral Pulse Rate: 59 Resp: 18 BP: 143/76 mmHg Patient Position, if appropriate: Sitting Oxygen Therapy SpO2: 99 % O2 Device: None (Room air) Pain:Pt with mild c/o headache during session, better with rest.    Locomotion : Ambulation Ambulation: Yes Ambulation/Gait Assistance: 4: Min assist Ambulation Distance (Feet): 155 Feet (x2) Ambulation/Gait Assistance Details: Verbal cues for sequencing;Verbal cues for technique;Verbal cues for precautions/safety;Verbal cues for gait pattern;Verbal cues for safe use of DME/AE;Manual facilitation for weight shifting Ambulation/Gait Assistance Details: Continue to perform gait training to/from gym without use of RW to challenge balance and improve quality of gait.  Continues to have increased difficulty when turning and also in busy environment or internall distracted.   Gait Gait: Yes Gait Pattern: Impaired  Gait Pattern: Step-through pattern;Trunk flexed;Narrow base of support;Ataxic;Decreased stride length Wheelchair Mobility Distance: 155 (x2)     Balance: Standardized Balance Assessment Standardized Balance Assessment: Berg Balance Test Berg Balance Test Sit to Stand: Able to stand  independently using hands Standing Unsupported: Able to stand 2  minutes with supervision Sitting with Back Unsupported but Feet Supported on Floor or Stool: Able to sit safely and securely 2 minutes Stand to Sit: Controls descent by using hands Transfers: Able to transfer safely, definite need of hands Standing Unsupported with Eyes Closed: Able to stand 10 seconds with supervision Standing Ubsupported with Feet Together: Able to place feet together independently and stand for 1 minute with supervision From Standing, Reach Forward with Outstretched Arm: Can reach forward >5 cm safely (2") From Standing Position, Pick up Object from Floor: Able to pick up shoe, needs supervision From Standing Position, Turn to Look Behind Over each Shoulder: Turn sideways only but maintains balance Turn 360 Degrees: Needs assistance while turning Standing Unsupported, Alternately Place Feet on Step/Stool: Needs assistance to keep from falling or unable to try Standing Unsupported, One Foot in Front: Needs help to step but can hold 15 seconds Standing on One Leg: Unable to try or needs assist to prevent fall Total Score: 30 See FIM for current functional status  Therapy/Group: Individual Therapy  Vista Deck 08/18/2013, 4:53 PM

## 2013-08-18 NOTE — Progress Notes (Signed)
Speech Language Pathology Daily Session Note  Patient Details  Name: Keith [name] MRN: [id number] Date of Birth: March 18, 1946  Today's Date: 08/18/2013 Time: 4098-1191 Time Calculation (min): 41 min  Short Term Goals: Week 2: SLP Short Term Goal 1 (Week 2): Pt will perform pharyngeal strengthening exercises with Min cues SLP Short Term Goal 2 (Week 2): Pt will consume 4oz of puree with Min verbal cues to utilite recommended safe swallow strategeis  SLP Short Term Goal 3 (Week 2): Pt will utilize speech intelligibility strategies with Supervision level verbal cues SLP Short Term Goal 4 (Week 2): Pt will consume Dys.1 textures and honey-thick liquids via teaspoon with Min cues to recall and utilize safe swallow strategies.  Skilled Therapeutic Interventions: Skilled treatment session focused on addressing dysphagia and self-care goals.  SLP facilitated session with breakfast tray set-up and initially Mod faded to Min verbal cues to recall and utilize safe swallow strategies during duration of meal.  Patient with overt s/s of aspiraiton; coughing episode at beginning of meal due to quick pace of self-feeding.  During coughing episode paitent required Total assist to stop eating until suspected aspirates cleared.  Patient had consumed 30% of Dys.1 textures and honey-thick liquids via spoon and nurse tech completed full supervision with meal.  Recommend to continue with current plan of care.    FIM:  Comprehension Comprehension Mode: Auditory Comprehension: 5-Understands complex 90% of the time/Cues < 10% of the time Expression Expression Mode: Verbal Expression: 5-Expresses complex 90% of the time/cues < 10% of the time Social Interaction Social Interaction: 5-Interacts appropriately 90% of the time - Needs monitoring or encouragement for participation or interaction. Problem Solving Problem Solving: 5-Solves basic 90% of the time/requires cueing < 10% of the time Memory Memory: 3-Recognizes or  recalls 50 - 74% of the time/requires cueing 25 - 49% of the time FIM - Eating Eating Activity: 5: Needs verbal cues/supervision  Pain Pain Assessment Pain Assessment: No/denies pain  Therapy/Group: Individual Therapy  Keith [name]., CCC-SLP [phone]  Keith [name] 08/18/2013, 11:56 AM

## 2013-08-18 NOTE — Progress Notes (Signed)
Patient ID: Keith [name], male   DOB: [birth year], 67 y.o.   MRN: [id number] 67 y.o. male with history of DM, HTN, who was admitted on 07/31/13 with left facial droop with dysarthria and one week history of imbalance as diplopia X 2 days. MRI/MRA brain done revealing two small acute brainstem infarcts in-right paracentral pons, and central to left paracentral medulla possibly involving left pyramid, no stenosis. 2D echo with EF 55-60% and grade 1 diastolic dysfunction. Carotid dopplers with 1-39% ICA stenosis. Neurology services consulted maintained on aspirin and Plavix therapy for CVA prophylaxis. Subcutaneous heparin added for DVT prophylaxis.Marland Kitchen MBS done 08/01/13 alternative nutritional means and NPO recommended    Subjective/Complaints: Passed MBS, discussed need to maintain po caloric intake, fluids can be supplemented by midline  Frontal HA improved with IV toradol Review of Systems - otherwise negative   Objective: Vital Signs: Blood pressure 125/65, pulse 52, temperature 97.2 F (36.2 C), temperature source Oral, resp. rate 17, weight 75.3 kg (166 lb 0.1 oz), SpO2 96.00%. Dg Swallowing Func-speech Pathology  08/17/2013   [name], CCC-SLP     08/17/2013  2:27 PM Objective Swallowing Evaluation: Modified Barium Swallowing Study   Patient Details  Name: Keith [name] MRN: [id number] Date of Birth: [birth year]  Today's Date: 08/17/2013 Time: 8469-6295 Time Calculation (min): 25 min  Past Medical History:  Past Medical History  Diagnosis Date  . Diabetes mellitus without complication   . Hypertension    Past Surgical History:  Past Surgical History  Procedure Laterality Date  . Prostate surgery    . Knee surgery     HPI:  67 y.o. male admitted to [facility] with left facial droop, dysarthria,  left UE and LE weakness- pt found to have 2 brain stem strokes  impacting right paracentral pons central to left paracent  medulla.  MBS ordered due to concerns for dysphagia.  Pt admits  to h/o "sore throat" and  transient problems swallowing in the  last few weeks.  Pt is a previous smoker, CXR negative 11/29.  Pt  admitted to CIR 08/16/13 and has been participating in therapies.  Repeat objective study ordered today to determine readiness for  p.o.       Recommendation/Prognosis  Clinical Impression:  Patient presents with functional improvements as compared to  previous objective assessment.  He continues to demonstrate a  mild oral and moderate sensorimotor, pharyngeal-cervical  esophageal phase dysphagia.  Patient's dysphagia is characterized  by generalized weakness, slight premature spillage of boluses  into vallecular space and a delayed swallow initiation.   Moderately severe residuals observed, which patient cleared with  use of multiple swallows; cued throat clears were effective at  reducing trace shallow penetration of honey-thick liquids.   Despite functional improvements nectar-thick liquid trails via  teaspoon were silently aspirated.  SLP educated patient regarding  need for strict use of safe swallow compensatory strategies with  initiation of a conservative Dys.1 texture and honey-thick liquid  via teaspoon diet.    Dysphagia Diagnosis: Mild oral phase dysphagia;Moderate  pharyngeal phase dysphagia;Moderate cervical esophageal phase  dysphagia Swallow Evaluation Recommendations Diet Recommendations: Dysphagia 1 (Puree);Honey-thick liquid Liquid Administration via: No straw;Spoon Medication Administration: Crushed with puree Supervision: Patient able to self feed;Full supervision/cueing  for compensatory strategies Compensations: Slow rate;Small sips/bites;Multiple dry swallows  after each bite/sip;Clear throat intermittently;Effortful swallow Postural Changes and/or Swallow Maneuvers: Out of bed for  meals;Seated upright 90 degrees;Upright 30-60 min after meal Oral Care Recommendations: Oral care BID Other Recommendations: Order thickener from  pharmacy;Prohibited  food (jello, ice cream, thin soups);Remove  water pitcher;Have  oral suction available Follow up Recommendations: Skilled Nursing facility;24 hour  supervision/assistance Prognosis Prognosis for Safe Diet Advancement: Good Barriers to Reach Goals: Severity of dysphagia Individuals Consulted Consulted and Agree with Results and Recommendations:  Patient;PA;RN  SLP Assessment/Plan  See Care Plan   Short Term Goals: Week 2: SLP Short Term Goal 1 (Week 2): Pt will perform  pharyngeal strengthening exercises with Min cues SLP Short Term Goal 2 (Week 2): Pt will consume 4oz of puree with  Min verbal cues to utilite recommended safe swallow strategeis  SLP Short Term Goal 3 (Week 2): Pt will utilize speech  intelligibility strategies with Supervision level verbal cues SLP Short Term Goal 4 (Week 2): Pt will consume Dys.1 textures  and honey-thick liquids via teaspoon with Min cues to recall and  utilize safe swallow strategies.  General:  Date of Onset: 08/01/13 HPI: 67 y.o. male admitted to [facility] with left facial droop,  dysarthria, left UE and LE weakness- pt found to have 2 brain  stem strokes impacting right paracentral pons central to left  paracent medulla.  MBS ordered due to concerns for dysphagia.  Pt  admits to h/o "sore throat" and transient problems swallowing in  the last few weeks.  Pt is a previous smoker, CXR negative 11/29.   Pt admitted to [facility] 08/16/13 and has been participating in  therapies. Repeat objective study ordered today to determine  readiness for p.o.   Type of Study: Modified Barium Swallowing Study Previous Swallow Assessment: MBS 11/30 and 12/9 with  recommendations for NPO Diet Prior to this Study: Dysphagia 1 (puree);Honey-thick liquids Temperature Spikes Noted: No Respiratory Status: Room air Behavior/Cognition: Alert;Cooperative;Pleasant mood;Hard of  hearing Oral Cavity - Dentition: Adequate natural dentition Oral Motor / Sensory Function: Impaired - see Bedside swallow  eval Self-Feeding Abilities: Able to feed self Patient  Positioning: Upright in chair Baseline Vocal Quality: Low vocal intensity Volitional Cough: Weak Volitional Swallow: Able to elicit Anatomy: Within functional limits Pharyngeal Secretions: Not observed secondary MBS  Reason for Referral:   Objectively evaluate swallow function  Oral Phase Oral Preparation/Oral Phase Oral Phase: Impaired Oral - Honey Oral - Honey Teaspoon: Within functional limits Oral - Nectar Oral - Nectar Teaspoon: Within functional limits Oral - Thin Oral - Thin Teaspoon: Not tested Oral - Solids Oral - Puree: Lingual/palatal residue Oral - Mechanical Soft: Lingual/palatal residue;Reduced posterior  propulsion Oral - Regular: Not tested Pharyngeal Phase  Pharyngeal Phase Pharyngeal Phase: Impaired Pharyngeal - Honey Pharyngeal - Honey Teaspoon: Pharyngeal residue -  valleculae;Pharyngeal residue - pyriform sinuses;Reduced tongue  base retraction;Reduced laryngeal elevation;Reduced anterior  laryngeal mobility;Reduced epiglottic inversion;Reduced  pharyngeal peristalsis;Reduced airway/laryngeal closure;Premature  spillage to valleculae;Delayed swallow  initiation;Penetration/Aspiration during swallow Penetration/Aspiration details (honey teaspoon): Material does  not enter airway;Material enters airway, remains ABOVE vocal  cords then ejected out;Material enters airway, remains ABOVE  vocal cords and not ejected out (cued throat clears removed most  of penetrates) Pharyngeal - Nectar Pharyngeal - Nectar Teaspoon: Delayed swallow  initiation;Premature spillage to valleculae;Reduced epiglottic  inversion;Reduced anterior laryngeal mobility;Reduced pharyngeal  peristalsis;Reduced airway/laryngeal closure;Reduced laryngeal  elevation;Reduced tongue base retraction;Penetration/Aspiration  before swallow;Trace aspiration;Pharyngeal residue - pyriform  sinuses;Pharyngeal residue - valleculae Penetration/Aspiration details (nectar teaspoon): Material enters  airway, CONTACTS cords and not ejected out  (cued throat clears  and cough not effective at removing penetrates; penetraes became  aspirates) Pharyngeal - Thin Pharyngeal - Thin Teaspoon: Not tested Pharyngeal - Solids  Pharyngeal - Puree: Premature spillage to valleculae;Delayed  swallow initiation;Reduced pharyngeal peristalsis;Reduced  epiglottic inversion;Reduced anterior laryngeal mobility;Reduced  laryngeal elevation;Reduced tongue base retraction;Pharyngeal  residue - valleculae;Pharyngeal residue - pyriform  sinuses;Pharyngeal residue - posterior pharnyx;Compensatory  strategies attempted (Comment) (cued chin tuck not effective at  reducing residue; cues for multiple swallows effective) Penetration/Aspiration details (puree): Material does not enter  airway Pharyngeal - Mechanical Soft: Delayed swallow  initiation;Premature spillage to valleculae;Reduced epiglottic  inversion;Reduced pharyngeal peristalsis;Reduced anterior  laryngeal mobility;Reduced laryngeal elevation;Reduced tongue  base retraction;Pharyngeal residue - valleculae;Pharyngeal  residue - pyriform sinuses;Pharyngeal residue - posterior  pharnyx;Compensatory strategies attempted (Comment) (cues for  multiple swallows effective) Pharyngeal - Regular: Not tested Cervical Esophageal Phase  Cervical Esophageal Phase - Comment Cervical Esophageal Comment: continued decreased UES opening due  to poor laryngeal elevation which is reduced with cues for hard  multiple swallows   G-Codes   Charlane Ferretti., CCC-SLP [phone]  BOWIE,MELISSA 08/17/2013, 10:05 AM     Results for orders placed during the hospital encounter of 08/05/13 (from the past 72 hour(s))  GLUCOSE, CAPILLARY     Status: Abnormal   Collection Time    08/15/13 11:48 AM      Result Value Range   Glucose-Capillary 163 (*) 70 - 99 mg/dL   Comment 1 Notify RN    GLUCOSE, CAPILLARY     Status: Abnormal   Collection Time    08/15/13  4:02 PM      Result Value Range   Glucose-Capillary 170 (*) 70 - 99 mg/dL   Comment 1  Notify RN    GLUCOSE, CAPILLARY     Status: Abnormal   Collection Time    08/15/13  8:35 PM      Result Value Range   Glucose-Capillary 185 (*) 70 - 99 mg/dL   Comment 1 Notify RN    GLUCOSE, CAPILLARY     Status: Abnormal   Collection Time    08/16/13 12:47 AM      Result Value Range   Glucose-Capillary 186 (*) 70 - 99 mg/dL   Comment 1 Notify RN    GLUCOSE, CAPILLARY     Status: Abnormal   Collection Time    08/16/13  4:03 AM      Result Value Range   Glucose-Capillary 180 (*) 70 - 99 mg/dL   Comment 1 Notify RN    BASIC METABOLIC PANEL     Status: Abnormal   Collection Time    08/16/13  5:45 AM      Result Value Range   Sodium 132 (*) 135 - 145 mEq/L   Potassium 4.5  3.5 - 5.1 mEq/L   Chloride 94 (*) 96 - 112 mEq/L   CO2 26  19 - 32 mEq/L   Glucose, Bld 197 (*) 70 - 99 mg/dL   BUN 18  6 - 23 mg/dL   Creatinine, Ser 0.27  0.50 - 1.35 mg/dL   Calcium 9.6  8.4 - 25.3 mg/dL   GFR calc non Af Amer 89 (*) >90 mL/min   GFR calc Af Amer >90  >90 mL/min   Comment: (NOTE)     The eGFR has been calculated using the CKD EPI equation.     This calculation has not been validated in all clinical situations.     eGFR's persistently <90 mL/min signify possible Chronic Kidney     Disease.  GLUCOSE, CAPILLARY     Status: Abnormal   Collection Time    08/16/13  7:35 AM  Result Value Range   Glucose-Capillary 188 (*) 70 - 99 mg/dL   Comment 1 Notify RN    GLUCOSE, CAPILLARY     Status: Abnormal   Collection Time    08/16/13 12:03 PM      Result Value Range   Glucose-Capillary 238 (*) 70 - 99 mg/dL   Comment 1 Notify RN    GLUCOSE, CAPILLARY     Status: Abnormal   Collection Time    08/16/13  4:21 PM      Result Value Range   Glucose-Capillary 124 (*) 70 - 99 mg/dL  GLUCOSE, CAPILLARY     Status: Abnormal   Collection Time    08/16/13  8:45 PM      Result Value Range   Glucose-Capillary 158 (*) 70 - 99 mg/dL  GLUCOSE, CAPILLARY     Status: Abnormal   Collection Time     08/16/13 11:46 PM      Result Value Range   Glucose-Capillary 139 (*) 70 - 99 mg/dL  GLUCOSE, CAPILLARY     Status: Abnormal   Collection Time    08/17/13  4:20 AM      Result Value Range   Glucose-Capillary 163 (*) 70 - 99 mg/dL  GLUCOSE, CAPILLARY     Status: Abnormal   Collection Time    08/17/13  7:36 AM      Result Value Range   Glucose-Capillary 202 (*) 70 - 99 mg/dL   Comment 1 Notify RN    GLUCOSE, CAPILLARY     Status: Abnormal   Collection Time    08/17/13  4:27 PM      Result Value Range   Glucose-Capillary 141 (*) 70 - 99 mg/dL   Comment 1 Notify RN    GLUCOSE, CAPILLARY     Status: Abnormal   Collection Time    08/17/13  9:11 PM      Result Value Range   Glucose-Capillary 159 (*) 70 - 99 mg/dL  GLUCOSE, CAPILLARY     Status: Abnormal   Collection Time    08/18/13 12:00 AM      Result Value Range   Glucose-Capillary 134 (*) 70 - 99 mg/dL  GLUCOSE, CAPILLARY     Status: Abnormal   Collection Time    08/18/13  4:04 AM      Result Value Range   Glucose-Capillary 121 (*) 70 - 99 mg/dL     HEENT: normal Cardio: RRR and no murmur Resp: CTA B/L and unlabored GI: BS positive and non distended Extremity:  Pulses positive and No Edema Skin:   Intact Neuro: a little slow this am, Cranial Nerve Abnormalities Left central 7, Abnormal Motor 3-/5 LUE, 3-/5 LLE, 5/5 on  Right side, Abnormal FMC Ataxic/ dec FMC and Dysarthric. Mod/Severe dymetria left FNF and Left Heel to Shin Musc/Skel:  Normal GEN: NAD   Assessment/Plan: 1. Functional deficits secondary to Right pontine and Left medullary infarcts which require 3+ hours per day of interdisciplinary therapy in a comprehensive inpatient rehab setting. Physiatrist is providing close team supervision and 24 hour management of active medical problems listed below. Physiatrist and rehab team continue to assess barriers to discharge/monitor patient progress toward functional and medical goals. FIM: FIM - Bathing Bathing  Steps Patient Completed: Chest;Right Arm;Left Arm;Abdomen;Front perineal area;Buttocks;Right upper leg;Left upper leg;Right lower leg (including foot);Left lower leg (including foot) Bathing: 5: Supervision: Safety issues/verbal cues  FIM - Upper Body Dressing/Undressing Upper body dressing/undressing steps patient completed: Pull shirt over trunk;Thread/unthread right  sleeve of front closure shirt/dress;Thread/unthread left sleeve of front closure shirt/dress;Pull shirt around back of front closure shirt/dress;Button/unbutton shirt Upper body dressing/undressing: 5: Supervision: Safety issues/verbal cues FIM - Lower Body Dressing/Undressing Lower body dressing/undressing steps patient completed: Thread/unthread right underwear leg;Thread/unthread left underwear leg;Pull underwear up/down;Thread/unthread right pants leg;Thread/unthread left pants leg;Pull pants up/down;Fasten/unfasten pants;Don/Doff right sock;Don/Doff left sock;Don/Doff right shoe;Don/Doff left shoe;Fasten/unfasten right shoe;Fasten/unfasten left shoe Lower body dressing/undressing: 5: Supervision: Safety issues/verbal cues  FIM - Toileting Toileting steps completed by patient: Adjust clothing prior to toileting;Performs perineal hygiene;Adjust clothing after toileting Toileting Assistive Devices: Grab bar or rail for support Toileting: 0: Activity did not occur  FIM - Diplomatic Services operational officer Devices: Grab bars Toilet Transfers: 0-Activity did not occur  FIM - Banker Devices: Bed rails;Arm rests Bed/Chair Transfer: 0: Activity did not occur  FIM - Locomotion: Wheelchair Distance: 155 Locomotion: Wheelchair: 0: Activity did not occur FIM - Locomotion: Ambulation Locomotion: Ambulation Assistive Devices: Designer, industrial/product Ambulation/Gait Assistance: 4: Min guard Locomotion: Ambulation: 4: Travels 150 ft or more with minimal assistance  (Pt.>75%)  Comprehension Comprehension Mode: Auditory Comprehension: 5-Understands complex 90% of the time/Cues < 10% of the time  Expression Expression Mode: Verbal Expression: 5-Expresses complex 90% of the time/cues < 10% of the time  Social Interaction Social Interaction: 5-Interacts appropriately 90% of the time - Needs monitoring or encouragement for participation or interaction.  Problem Solving Problem Solving: 5-Solves basic 90% of the time/requires cueing < 10% of the time  Memory Memory: 5-Recognizes or recalls 90% of the time/requires cueing < 10% of the time  Medical Problem List and Plan:  1. Thrombotic left medullary, right paracentral pontine infarct  2. DVT Prophylaxis/Anticoagulation: Subcutaneous heparin. Monitor platelet counts and any signs of bleeding  3. Pain Management: Tylenol as needed for headache--this has been effective 4. Mood: Zoloft 50 mg daily, Ativan 0.5 mg every 4 as needed anxiety , held secondary to NPO 5. Neuropsych: This patient is capable of making decisions on his own behalf.  6. Dysphagia. Patient currently on D1 and honey thick hold off on PEG, monitor intake for 1-2 days to decide on midline 7. Hypertension. Norvasc 5 mg daily.  Borderline controlled at present  8. Hyperlipidemia. Lipitor  9. Diabetes mellitus with peripheral neuropathy. Controlled Hemoglobin A1c 7.4. Continue sliding scale insulin, resume Glucophage 500 mg to twice a day      LOS (Days) 13 A FACE TO FACE EVALUATION WAS PERFORMED  Kalan Rinn E 08/18/2013, 8:45 AM

## 2013-08-19 ENCOUNTER — Encounter (HOSPITAL_COMMUNITY): Payer: Medicare Other | Admitting: Occupational Therapy

## 2013-08-19 ENCOUNTER — Inpatient Hospital Stay (HOSPITAL_COMMUNITY): Payer: Medicare Other | Admitting: Speech Pathology

## 2013-08-19 ENCOUNTER — Inpatient Hospital Stay (HOSPITAL_COMMUNITY): Payer: Medicare Other

## 2013-08-19 ENCOUNTER — Inpatient Hospital Stay (HOSPITAL_COMMUNITY): Payer: Medicare Other | Admitting: Rehabilitation

## 2013-08-19 DIAGNOSIS — I6330 Cerebral infarction due to thrombosis of unspecified cerebral artery: Secondary | ICD-10-CM

## 2013-08-19 DIAGNOSIS — I10 Essential (primary) hypertension: Secondary | ICD-10-CM

## 2013-08-19 DIAGNOSIS — I69993 Ataxia following unspecified cerebrovascular disease: Secondary | ICD-10-CM

## 2013-08-19 DIAGNOSIS — I69991 Dysphagia following unspecified cerebrovascular disease: Secondary | ICD-10-CM

## 2013-08-19 DIAGNOSIS — F1010 Alcohol abuse, uncomplicated: Secondary | ICD-10-CM

## 2013-08-19 LAB — GLUCOSE, CAPILLARY
Glucose-Capillary: 133 mg/dL — ABNORMAL HIGH (ref 70–99)
Glucose-Capillary: 135 mg/dL — ABNORMAL HIGH (ref 70–99)
Glucose-Capillary: 142 mg/dL — ABNORMAL HIGH (ref 70–99)

## 2013-08-19 MED ORDER — SODIUM CHLORIDE 0.9 % IJ SOLN
10.0000 mL | INTRAMUSCULAR | Status: DC | PRN
  Administered 2013-08-19: 10 mL

## 2013-08-19 NOTE — Progress Notes (Signed)
Peripherally Inserted Central Catheter/Midline Placement  The IV Nurse has discussed with the patient and/or persons authorized to consent for the patient, the purpose of this procedure and the potential benefits and risks involved with this procedure.  The benefits include less needle sticks, lab draws from the catheter and patient may be discharged home with the catheter.  Risks include, but not limited to, infection, bleeding, blood clot (thrombus formation), and puncture of an artery; nerve damage and irregular heat beat.  Alternatives to this procedure were also discussed.  PICC/Midline Placement Documentation        Vevelyn Pat 08/19/2013, 11:33 AM

## 2013-08-19 NOTE — Progress Notes (Signed)
Speech Language Pathology Daily Session Note  Patient Details  Name: Keith [name] MRN: [id number] Date of Birth: 24-Jul-1946  Today's Date: 08/19/2013 Time: 1200-1230 Time Calculation (min): 30 min  Short Term Goals: Week 2: SLP Short Term Goal 1 (Week 2): Pt will perform pharyngeal strengthening exercises with Min cues SLP Short Term Goal 2 (Week 2): Pt will consume 4oz of puree with Min verbal cues to utilite recommended safe swallow strategeis  SLP Short Term Goal 3 (Week 2): Pt will utilize speech intelligibility strategies with Supervision level verbal cues SLP Short Term Goal 4 (Week 2): Pt will consume Dys.1 textures and honey-thick liquids via teaspoon with Min cues to recall and utilize safe swallow strategies.  Skilled Therapeutic Interventions: Pt participated in co-treatment with OT in [employer] with focus on cognitive-linguistic and dysphagia goals. Pt recalled need for spoon to consume honey-thick liquids with supervision level question cue. Pt consumed Dys 1 textures and honey-thick liquids by spoon with supervision for use of compensatory strategies, including throat clear and multiple swallows. Aside from compensatory throat clears, no other overt s/s of aspiration were observed. Continue plan of care.   FIM:  Comprehension Comprehension Mode: Auditory Comprehension: 5-Understands basic 90% of the time/requires cueing < 10% of the time Expression Expression Mode: Verbal Expression: 4-Expresses basic 75 - 89% of the time/requires cueing 10 - 24% of the time. Needs helper to occlude trach/needs to repeat words. Social Interaction Social Interaction: 5-Interacts appropriately 90% of the time - Needs monitoring or encouragement for participation or interaction. Problem Solving Problem Solving: 5-Solves basic 90% of the time/requires cueing < 10% of the time Memory Memory: 4-Recognizes or recalls 75 - 89% of the time/requires cueing 10 - 24% of the time FIM - Eating Eating  Activity: 5: Supervision/cues  Pain  No/denies pain  Therapy/Group: Group Therapy   Maxcine Ham, M.A. CCC-SLP [phone]   Maxcine Ham 08/19/2013, 4:21 PM

## 2013-08-19 NOTE — Progress Notes (Signed)
Occupational Therapy Note  Patient Details  Name: Keith [name] MRN: [id number] Date of Birth: 25-Nov-1945 Today's Date: 08/19/2013  [employer] with SLP No c/o pain  1130-12 Self feeding group with focus on managing small bites, using a spoon to drink from a cup, eating at a slow rate, functional communication of wants and needs   Adan Sis 08/19/2013, 2:40 PM

## 2013-08-19 NOTE — Patient Care Conference (Signed)
Inpatient RehabilitationTeam Conference and Plan of Care Update Date: 08/18/2013   Time: 11:40 AM    Patient Name: Keith [name]      Medical Record Number: [id number]  Date of Birth: [birth year] Sex: Male         Room/Bed: 4W26C/4W26C-01 Payor Info: Payor: MEDICARE / Plan: MEDICARE PART A AND B / Product Type: *No Product type* /    Admitting Diagnosis: B CVA  Admit Date/Time:  08/05/2013  4:32 PM Admission Comments: No comment available   Primary Diagnosis:  <principal problem not specified> Principal Problem: <principal problem not specified>  Patient Active Problem List   Diagnosis Date Noted  . Dysarthria due to cerebrovascular accident 08/05/2013  . Weakness 08/05/2013  . Type II or unspecified type diabetes mellitus without mention of complication, not stated as uncontrolled 08/05/2013  . Essential hypertension, benign 08/05/2013  . Depression with anxiety 08/05/2013  . Alcohol abuse 08/05/2013  . Facial droop 07/31/2013  . CVA (cerebral infarction) 07/31/2013    Expected Discharge Date: Expected Discharge Date: 08/21/13  Team Members Present: Physician leading conference: Dr. Claudette Laws Social Worker Present: Dossie Der, LCSW;Jenny Wynne Rozak, LCSW Nurse Present: Other (comment) Tennis Must, RN) PT Present: Harriet Butte, PT OT Present: Rosalio Loud, Heath Lark, OT SLP Present: Fae Pippin, SLP PPS Coordinator present : Tora Duck, RN, CRRN;Becky Henrene Dodge, PT     Current Status/Progress Goal Weekly Team Focus  Medical   minA mob, passed MBS but very slow with eating D1 honey diet  focus on caloric intake  midline for fluids   Bowel/Bladder   Continent of bowel and bladder  To remain continent of bowel and bladder  Monitor for constipation/diarrhea   Swallow/Nutrition/ Hydration   Dys.1 textures and Honey-thick liquids via teaspoon with Mod cues for use of safe swalow strategies  least restrictive p.o. intake   continue with carryover of safe swallow  strategies   ADL's   Pt is currently supervision for bathing and dressing sit to stand.  Still needs min assist for toileting and toilet transfers at times without use of assistive device.  Mild ataxia noted in the LUE with functional use but can perform his own buttoning of his shirt and tying of his shoes.    Goals are overall supervison level for bathing, dressing, toileting.  selfcare retraining, neuromuscular re-education, balance training,   Mobility   Pt is currently supervision for bed mobility, min/guard for standing, min/guard to close supervision with gait with RW, min assist without RW, Mod assist at times for high level balance.    supervision overall  NMR for LUE/LE, high level balance, coordination, ankle/hip strategy, gait without AD   Communication   Supervision assist  Supervision   goals met   Safety/Cognition/ Behavioral Observations  Supervision   Min assist   increase recall and carryover of safe swallow strategies   Pain   PRN Ultram 50mg  given at 2213 for H/A  Pain level at or below 2/10  Assess pain Q shift and prn   Skin   Bruises to abd  Inspect skin daily for s/s of breakdown  Educate patient and family on the importance of skin care; assess for skin breakdown Q shift    Rehab Goals Patient on target to meet rehab goals: Yes Rehab Goals Revised: None *See Care Plan and progress notes for long and short-term goals.  Barriers to Discharge: no 24/7 sup    Possible Resolutions to Barriers:  SNF with midline for fluids    Discharge  Planning/Teaching Needs:  Pt does not have a 24/7 caregiver.  He will need SNF after his stay on CIR. Pt's son is investigating facilities and CSW will pursue his preferences.  None at this time.   Team Discussion:  Pt passed his swallowing study and was put on a Dysphagia 1, honey thick liquids via teaspoon diet.  Team will need to focus on pt's caloric intake and pt will need ongoing hydration at night.  Pt is progressing well in  therapies.    Revisions to Treatment Plan:  None   Continued Need for Acute Rehabilitation Level of Care: The patient requires daily medical management by a physician with specialized training in physical medicine and rehabilitation for the following conditions: Daily direction of a multidisciplinary physical rehabilitation program to ensure safe treatment while eliciting the highest outcome that is of practical value to the patient.: Yes Daily medical management of patient stability for increased activity during participation in an intensive rehabilitation regime.: Yes Daily analysis of laboratory values and/or radiology reports with any subsequent need for medication adjustment of medical intervention for : Neurological problems  Amali Uhls, Vista Deck 08/19/2013, 10:54 AM

## 2013-08-19 NOTE — Progress Notes (Signed)
Physical Therapy Session Note  Patient Details  Name: Keith [name] MRN: [id number] Date of Birth: 02-Dec-1945  Today's Date: 08/19/2013 Time: 1345-1440 Time Calculation (min): 55 min  Short Term Goals: Week 2:  PT Short Term Goal 1 (Week 2): =LTG's due to ELOS  Skilled Therapeutic Interventions/Progress Updates:   Pt received sitting in w/c in room, agreeable to therapy this afternoon.  Pt self propelled with BLE/UEs >150' at supervision level in order to increase coordination with bimanual movements.  Pt continues to show marked improvements when performing coordination tasks.  Once in gym, performed standing Wii balance in order to work on ankle and hip strategies.  Pt requires min/guard assist today to maintain balance throughout activities.  Continue to note increased difficulty with accuracy, esp with increased demand in speed during balance activity.  Performed gait training in hallway to stair well at min/guard to min assist with continued cues for increased step width when making turns.  Performed 11, 6" steps using single handrail in alternating pattern forwards in order to increase coordination with alternating LE movements.  Pt able to complete stairs with min assist with cues for decreased speed and safety.  Performed standing balance activity on balance beam to work on ankle hip strategy while reaching down and L to clothes pins (resisted) to work on increased weight shift/weight bearing to the L and also to work on increased quad control when squatting and standing.  Pt able to maintain balance with min/guard assist, however had once overt LOB requiring mod assist to correct.  Had pt ambulate back to room pushing w/c to continue to work on coordination with keeping w/c in straight path.  Pt able to perform at min/guard level for assistance.  Ended session with pt consuming honey thick orange juice with supervision and min cues for two dry swallows in between in bite.  Pt doing well with  recalling to perform throat clears.  Pt left in room in w/c with all needs in reach.   Therapy Documentation Precautions:  Precautions Precautions: Fall Precaution Comments: ataxia in the LUE and LLE Restrictions Weight Bearing Restrictions: No   Pain: Pt with mild c/o pain this afternoon.        See FIM for current functional status  Therapy/Group: Individual Therapy  Vista Deck 08/19/2013, 4:12 PM

## 2013-08-19 NOTE — Progress Notes (Signed)
Social Work Patient ID: Keith [name], male   DOB: [birth year], 67 y.o.   MRN: [id number]  CSW spoke with pt and pt's son, Keith [name] via telephone, about team conference discussion.  Team agrees with plan for SNF, as pt will not have a 24/7 caregiver and he will need continued therapies.  Son understood that pt would need to continue with IV fluids at night.  Son has shared SNF preferences with CSW and CSW will pursue these for them.  Told son that pt could transfer in the next few days.  Pt and son are both pleased that pt will be closer to home soon.

## 2013-08-19 NOTE — Progress Notes (Signed)
Patient ID: Keith [name], male   DOB: 08/14/1946, 67 y.o.   MRN: [id number] 67 y.o. male with history of DM, HTN, who was admitted on 07/31/13 with left facial droop with dysarthria and one week history of imbalance as diplopia X 2 days. MRI/MRA brain done revealing two small acute brainstem infarcts in-right paracentral pons, and central to left paracentral medulla possibly involving left pyramid, no stenosis. 2D echo with EF 55-60% and grade 1 diastolic dysfunction. Carotid dopplers with 1-39% ICA stenosis. Neurology services consulted maintained on aspirin and Plavix therapy for CVA prophylaxis. Subcutaneous heparin added for DVT prophylaxis.Marland Kitchen MBS done 08/01/13 alternative nutritional means and NPO recommended    Subjective/Complaints: Passed MBS, discussed need to maintain po caloric intake, fluids can be supplemented by midline No PEG Back on Plavix Review of Systems - otherwise negative   Objective: Vital Signs: Blood pressure 147/70, pulse 64, temperature 97.2 F (36.2 C), temperature source Oral, resp. rate 18, weight 74.9 kg (165 lb 2 oz), SpO2 98.00%. Dg Swallowing Func-speech Pathology  08/17/2013   [name], CCC-SLP     08/17/2013  2:27 PM Objective Swallowing Evaluation: Modified Barium Swallowing Study   Patient Details  Name: Keith [name] MRN: [id number] Date of Birth: June 21, 1946  Today's Date: 08/17/2013 Time: 5621-3086 Time Calculation (min): 25 min  Past Medical History:  Past Medical History  Diagnosis Date  . Diabetes mellitus without complication   . Hypertension    Past Surgical History:  Past Surgical History  Procedure Laterality Date  . Prostate surgery    . Knee surgery     HPI:  67 y.o. male admitted to [facility] with left facial droop, dysarthria,  left UE and LE weakness- pt found to have 2 brain stem strokes  impacting right paracentral pons central to left paracent  medulla.  MBS ordered due to concerns for dysphagia.  Pt admits  to h/o "sore throat" and transient problems  swallowing in the  last few weeks.  Pt is a previous smoker, CXR negative 11/29.  Pt  admitted to CIR 08/16/13 and has been participating in therapies.  Repeat objective study ordered today to determine readiness for  p.o.       Recommendation/Prognosis  Clinical Impression:  Patient presents with functional improvements as compared to  previous objective assessment.  He continues to demonstrate a  mild oral and moderate sensorimotor, pharyngeal-cervical  esophageal phase dysphagia.  Patient's dysphagia is characterized  by generalized weakness, slight premature spillage of boluses  into vallecular space and a delayed swallow initiation.   Moderately severe residuals observed, which patient cleared with  use of multiple swallows; cued throat clears were effective at  reducing trace shallow penetration of honey-thick liquids.   Despite functional improvements nectar-thick liquid trails via  teaspoon were silently aspirated.  SLP educated patient regarding  need for strict use of safe swallow compensatory strategies with  initiation of a conservative Dys.1 texture and honey-thick liquid  via teaspoon diet.    Dysphagia Diagnosis: Mild oral phase dysphagia;Moderate  pharyngeal phase dysphagia;Moderate cervical esophageal phase  dysphagia Swallow Evaluation Recommendations Diet Recommendations: Dysphagia 1 (Puree);Honey-thick liquid Liquid Administration via: No straw;Spoon Medication Administration: Crushed with puree Supervision: Patient able to self feed;Full supervision/cueing  for compensatory strategies Compensations: Slow rate;Small sips/bites;Multiple dry swallows  after each bite/sip;Clear throat intermittently;Effortful swallow Postural Changes and/or Swallow Maneuvers: Out of bed for  meals;Seated upright 90 degrees;Upright 30-60 min after meal Oral Care Recommendations: Oral care BID Other Recommendations: Order thickener from pharmacy;Prohibited  food (jello, ice cream, thin soups);Remove water pitcher;Have   oral suction available Follow up Recommendations: Skilled Nursing facility;24 hour  supervision/assistance Prognosis Prognosis for Safe Diet Advancement: Good Barriers to Reach Goals: Severity of dysphagia Individuals Consulted Consulted and Agree with Results and Recommendations:  Patient;PA;RN  SLP Assessment/Plan  See Care Plan   Short Term Goals: Week 2: SLP Short Term Goal 1 (Week 2): Pt will perform  pharyngeal strengthening exercises with Min cues SLP Short Term Goal 2 (Week 2): Pt will consume 4oz of puree with  Min verbal cues to utilite recommended safe swallow strategeis  SLP Short Term Goal 3 (Week 2): Pt will utilize speech  intelligibility strategies with Supervision level verbal cues SLP Short Term Goal 4 (Week 2): Pt will consume Dys.1 textures  and honey-thick liquids via teaspoon with Min cues to recall and  utilize safe swallow strategies.  General:  Date of Onset: 08/01/13 HPI: 67 y.o. male admitted to [facility] with left facial droop,  dysarthria, left UE and LE weakness- pt found to have 2 brain  stem strokes impacting right paracentral pons central to left  paracent medulla.  MBS ordered due to concerns for dysphagia.  Pt  admits to h/o "sore throat" and transient problems swallowing in  the last few weeks.  Pt is a previous smoker, CXR negative 11/29.   Pt admitted to CIR 08/16/13 and has been participating in  therapies. Repeat objective study ordered today to determine  readiness for p.o.   Type of Study: Modified Barium Swallowing Study Previous Swallow Assessment: MBS 11/30 and 12/9 with  recommendations for NPO Diet Prior to this Study: Dysphagia 1 (puree);Honey-thick liquids Temperature Spikes Noted: No Respiratory Status: Room air Behavior/Cognition: Alert;Cooperative;Pleasant mood;Hard of  hearing Oral Cavity - Dentition: Adequate natural dentition Oral Motor / Sensory Function: Impaired - see Bedside swallow  eval Self-Feeding Abilities: Able to feed self Patient Positioning: Upright in  chair Baseline Vocal Quality: Low vocal intensity Volitional Cough: Weak Volitional Swallow: Able to elicit Anatomy: Within functional limits Pharyngeal Secretions: Not observed secondary MBS  Reason for Referral:   Objectively evaluate swallow function  Oral Phase Oral Preparation/Oral Phase Oral Phase: Impaired Oral - Honey Oral - Honey Teaspoon: Within functional limits Oral - Nectar Oral - Nectar Teaspoon: Within functional limits Oral - Thin Oral - Thin Teaspoon: Not tested Oral - Solids Oral - Puree: Lingual/palatal residue Oral - Mechanical Soft: Lingual/palatal residue;Reduced posterior  propulsion Oral - Regular: Not tested Pharyngeal Phase  Pharyngeal Phase Pharyngeal Phase: Impaired Pharyngeal - Honey Pharyngeal - Honey Teaspoon: Pharyngeal residue -  valleculae;Pharyngeal residue - pyriform sinuses;Reduced tongue  base retraction;Reduced laryngeal elevation;Reduced anterior  laryngeal mobility;Reduced epiglottic inversion;Reduced  pharyngeal peristalsis;Reduced airway/laryngeal closure;Premature  spillage to valleculae;Delayed swallow  initiation;Penetration/Aspiration during swallow Penetration/Aspiration details (honey teaspoon): Material does  not enter airway;Material enters airway, remains ABOVE vocal  cords then ejected out;Material enters airway, remains ABOVE  vocal cords and not ejected out (cued throat clears removed most  of penetrates) Pharyngeal - Nectar Pharyngeal - Nectar Teaspoon: Delayed swallow  initiation;Premature spillage to valleculae;Reduced epiglottic  inversion;Reduced anterior laryngeal mobility;Reduced pharyngeal  peristalsis;Reduced airway/laryngeal closure;Reduced laryngeal  elevation;Reduced tongue base retraction;Penetration/Aspiration  before swallow;Trace aspiration;Pharyngeal residue - pyriform  sinuses;Pharyngeal residue - valleculae Penetration/Aspiration details (nectar teaspoon): Material enters  airway, CONTACTS cords and not ejected out (cued throat clears  and  cough not effective at removing penetrates; penetraes became  aspirates) Pharyngeal - Thin Pharyngeal - Thin Teaspoon: Not tested Pharyngeal - Solids Pharyngeal -  Puree: Premature spillage to valleculae;Delayed  swallow initiation;Reduced pharyngeal peristalsis;Reduced  epiglottic inversion;Reduced anterior laryngeal mobility;Reduced  laryngeal elevation;Reduced tongue base retraction;Pharyngeal  residue - valleculae;Pharyngeal residue - pyriform  sinuses;Pharyngeal residue - posterior pharnyx;Compensatory  strategies attempted (Comment) (cued chin tuck not effective at  reducing residue; cues for multiple swallows effective) Penetration/Aspiration details (puree): Material does not enter  airway Pharyngeal - Mechanical Soft: Delayed swallow  initiation;Premature spillage to valleculae;Reduced epiglottic  inversion;Reduced pharyngeal peristalsis;Reduced anterior  laryngeal mobility;Reduced laryngeal elevation;Reduced tongue  base retraction;Pharyngeal residue - valleculae;Pharyngeal  residue - pyriform sinuses;Pharyngeal residue - posterior  pharnyx;Compensatory strategies attempted (Comment) (cues for  multiple swallows effective) Pharyngeal - Regular: Not tested Cervical Esophageal Phase  Cervical Esophageal Phase - Comment Cervical Esophageal Comment: continued decreased UES opening due  to poor laryngeal elevation which is reduced with cues for hard  multiple swallows   G-Codes   Charlane Ferretti., CCC-SLP [phone]  BOWIE,MELISSA 08/17/2013, 10:05 AM     Results for orders placed during the hospital encounter of 08/05/13 (from the past 72 hour(s))  GLUCOSE, CAPILLARY     Status: Abnormal   Collection Time    08/16/13 12:03 PM      Result Value Range   Glucose-Capillary 238 (*) 70 - 99 mg/dL   Comment 1 Notify RN    GLUCOSE, CAPILLARY     Status: Abnormal   Collection Time    08/16/13  4:21 PM      Result Value Range   Glucose-Capillary 124 (*) 70 - 99 mg/dL  GLUCOSE, CAPILLARY     Status:  Abnormal   Collection Time    08/16/13  8:45 PM      Result Value Range   Glucose-Capillary 158 (*) 70 - 99 mg/dL  GLUCOSE, CAPILLARY     Status: Abnormal   Collection Time    08/16/13 11:46 PM      Result Value Range   Glucose-Capillary 139 (*) 70 - 99 mg/dL  GLUCOSE, CAPILLARY     Status: Abnormal   Collection Time    08/17/13  4:20 AM      Result Value Range   Glucose-Capillary 163 (*) 70 - 99 mg/dL  GLUCOSE, CAPILLARY     Status: Abnormal   Collection Time    08/17/13  7:36 AM      Result Value Range   Glucose-Capillary 202 (*) 70 - 99 mg/dL   Comment 1 Notify RN    GLUCOSE, CAPILLARY     Status: Abnormal   Collection Time    08/17/13  4:27 PM      Result Value Range   Glucose-Capillary 141 (*) 70 - 99 mg/dL   Comment 1 Notify RN    GLUCOSE, CAPILLARY     Status: Abnormal   Collection Time    08/17/13  9:11 PM      Result Value Range   Glucose-Capillary 159 (*) 70 - 99 mg/dL  GLUCOSE, CAPILLARY     Status: Abnormal   Collection Time    08/18/13 12:00 AM      Result Value Range   Glucose-Capillary 134 (*) 70 - 99 mg/dL  GLUCOSE, CAPILLARY     Status: Abnormal   Collection Time    08/18/13  4:04 AM      Result Value Range   Glucose-Capillary 121 (*) 70 - 99 mg/dL  GLUCOSE, CAPILLARY     Status: Abnormal   Collection Time    08/18/13  9:13 AM      Result Value  Range   Glucose-Capillary 146 (*) 70 - 99 mg/dL  GLUCOSE, CAPILLARY     Status: Abnormal   Collection Time    08/18/13  2:34 PM      Result Value Range   Glucose-Capillary 125 (*) 70 - 99 mg/dL  GLUCOSE, CAPILLARY     Status: Abnormal   Collection Time    08/18/13  4:10 PM      Result Value Range   Glucose-Capillary 134 (*) 70 - 99 mg/dL   Comment 1 Notify RN    GLUCOSE, CAPILLARY     Status: Abnormal   Collection Time    08/18/13  8:25 PM      Result Value Range   Glucose-Capillary 198 (*) 70 - 99 mg/dL   Comment 1 Notify RN    GLUCOSE, CAPILLARY     Status: Abnormal   Collection Time     08/19/13 12:07 AM      Result Value Range   Glucose-Capillary 133 (*) 70 - 99 mg/dL   Comment 1 Notify RN    GLUCOSE, CAPILLARY     Status: Abnormal   Collection Time    08/19/13  4:00 AM      Result Value Range   Glucose-Capillary 142 (*) 70 - 99 mg/dL   Comment 1 Notify RN       HEENT: normal Cardio: RRR and no murmur Resp: CTA B/L and unlabored GI: BS positive and non distended Extremity:  Pulses positive and No Edema Skin:   Intact Neuro: a little slow this am, Cranial Nerve Abnormalities Left central 7, Abnormal Motor 3-/5 LUE, 3-/5 LLE, 5/5 on  Right side, Abnormal FMC Ataxic/ dec FMC and Dysarthric. Mod/Severe dymetria left FNF and Left Heel to Shin Musc/Skel:  Normal GEN: NAD   Assessment/Plan: 1. Functional deficits secondary to Right pontine and Left medullary infarcts which require 3+ hours per day of interdisciplinary therapy in a comprehensive inpatient rehab setting. Physiatrist is providing close team supervision and 24 hour management of active medical problems listed below. Physiatrist and rehab team continue to assess barriers to discharge/monitor patient progress toward functional and medical goals. FIM: FIM - Bathing Bathing Steps Patient Completed: Chest;Right Arm;Left Arm;Abdomen;Front perineal area;Buttocks;Right upper leg;Left upper leg;Right lower leg (including foot);Left lower leg (including foot) Bathing: 5: Supervision: Safety issues/verbal cues  FIM - Upper Body Dressing/Undressing Upper body dressing/undressing steps patient completed: Pull shirt over trunk;Thread/unthread right sleeve of front closure shirt/dress;Thread/unthread left sleeve of front closure shirt/dress;Pull shirt around back of front closure shirt/dress;Button/unbutton shirt Upper body dressing/undressing: 5: Supervision: Safety issues/verbal cues FIM - Lower Body Dressing/Undressing Lower body dressing/undressing steps patient completed: Thread/unthread right underwear  leg;Thread/unthread left underwear leg;Pull underwear up/down;Thread/unthread right pants leg;Thread/unthread left pants leg;Pull pants up/down;Fasten/unfasten pants;Don/Doff right sock;Don/Doff left sock;Don/Doff right shoe;Don/Doff left shoe;Fasten/unfasten right shoe;Fasten/unfasten left shoe Lower body dressing/undressing: 5: Supervision: Safety issues/verbal cues  FIM - Toileting Toileting steps completed by patient: Adjust clothing prior to toileting;Performs perineal hygiene;Adjust clothing after toileting Toileting Assistive Devices: Grab bar or rail for support Toileting: 0: Activity did not occur  FIM - Diplomatic Services operational officer Devices: Grab bars Toilet Transfers: 0-Activity did not occur  FIM - Banker Devices: Bed rails;Arm rests Bed/Chair Transfer: 0: Activity did not occur  FIM - Locomotion: Wheelchair Distance: 155 (x2) Locomotion: Wheelchair: 5: Travels 150 ft or more: maneuvers on rugs and over door sills with supervision, cueing or coaxing FIM - Locomotion: Ambulation Locomotion: Ambulation Assistive Devices: Designer, industrial/product  Ambulation/Gait Assistance: 4: Min assist Locomotion: Ambulation: 4: Travels 150 ft or more with minimal assistance (Pt.>75%)  Comprehension Comprehension Mode: Auditory Comprehension: 5-Understands basic 90% of the time/requires cueing < 10% of the time  Expression Expression Mode: Verbal Expression: 5-Expresses basic 90% of the time/requires cueing < 10% of the time.  Social Interaction Social Interaction: 5-Interacts appropriately 90% of the time - Needs monitoring or encouragement for participation or interaction.  Problem Solving Problem Solving: 5-Solves basic 90% of the time/requires cueing < 10% of the time  Memory Memory: 3-Recognizes or recalls 50 - 74% of the time/requires cueing 25 - 49% of the time  Medical Problem List and Plan:  1. Thrombotic left medullary,  right paracentral pontine infarct  2. DVT Prophylaxis/Anticoagulation: Subcutaneous heparin. Monitor platelet counts and any signs of bleeding  3. Pain Management: Tylenol as needed for headache--this has been effective 4. Mood: Zoloft 50 mg daily, Ativan 0.5 mg every 4 as needed anxiety , held secondary to NPO 5. Neuropsych: This patient is capable of making decisions on his own behalf.  6. Dysphagia. Patient currently on D1 and honey thick hold off on PEG,oral fluids yesterday  place midline 7. Hypertension. Norvasc 5 mg daily.  Borderline controlled at present  8. Hyperlipidemia. Lipitor  9. Diabetes mellitus with peripheral neuropathy. Controlled Hemoglobin A1c 7.4. Continue sliding scale insulin, resume Glucophage 500 mg to twice a day      LOS (Days) 14 A FACE TO FACE EVALUATION WAS PERFORMED  KIRSTEINS,ANDREW E 08/19/2013, 8:37 AM

## 2013-08-19 NOTE — Progress Notes (Signed)
Social Work Patient ID: Keith [name], male   DOB: 11-Sep-1945, 67 y.o.   MRN: [id number]  Vista Deck Mell Mellott, LCSW Social Worker Signed  Patient Care Conference Service date: 08/19/2013 10:54 AM  Inpatient RehabilitationTeam Conference and Plan of Care Update Date: 08/18/2013   Time: 11:40 AM     Patient Name: Keith [name]       Medical Record Number: [id number]   Date of Birth: 04-04-1946 Sex: Male         Room/Bed: 4W26C/4W26C-01 Payor Info: Payor: MEDICARE / Plan: MEDICARE PART A AND B / Product Type: *No Product type* /   Admitting Diagnosis: B CVA   Admit Date/Time:  08/05/2013  4:32 PM Admission Comments: No comment available   Primary Diagnosis:  <principal problem not specified> Principal Problem: <principal problem not specified>    Patient Active Problem List     Diagnosis  Date Noted   .  Dysarthria due to cerebrovascular accident  08/05/2013   .  Weakness  08/05/2013   .  Type II or unspecified type diabetes mellitus without mention of complication, not stated as uncontrolled  08/05/2013   .  Essential hypertension, benign  08/05/2013   .  Depression with anxiety  08/05/2013   .  Alcohol abuse  08/05/2013   .  Facial droop  07/31/2013   .  CVA (cerebral infarction)  07/31/2013     Expected Discharge Date: Expected Discharge Date: 08/21/13  Team Members Present: Physician leading conference: Dr. Claudette Laws Social Worker Present: Dossie Der, LCSW;Jenny Gaynelle Pastrana, LCSW Nurse Present: Other (comment) Tennis Must, RN) PT Present: Harriet Butte, PT OT Present: Rosalio Loud, Heath Lark, OT SLP Present: Fae Pippin, SLP PPS Coordinator present : Tora Duck, RN, CRRN;Becky Henrene Dodge, PT        Current Status/Progress  Goal  Weekly Team Focus   Medical     minA mob, passed MBS but very slow with eating D1 honey diet  focus on caloric intake  midline for fluids   Bowel/Bladder     Continent of bowel and bladder  To remain continent of bowel and bladder   Monitor for constipation/diarrhea   Swallow/Nutrition/ Hydration     Dys.1 textures and Honey-thick liquids via teaspoon with Mod cues for use of safe swalow strategies  least restrictive p.o. intake   continue with carryover of safe swallow strategies   ADL's     Pt is currently supervision for bathing and dressing sit to stand.  Still needs min assist for toileting and toilet transfers at times without use of assistive device.  Mild ataxia noted in the LUE with functional use but can perform his own buttoning of his shirt and tying of his shoes.    Goals are overall supervison level for bathing, dressing, toileting.  selfcare retraining, neuromuscular re-education, balance training,   Mobility     Pt is currently supervision for bed mobility, min/guard for standing, min/guard to close supervision with gait with RW, min assist without RW, Mod assist at times for high level balance.    supervision overall  NMR for LUE/LE, high level balance, coordination, ankle/hip strategy, gait without AD   Communication     Supervision assist  Supervision   goals met   Safety/Cognition/ Behavioral Observations    Supervision   Min assist   increase recall and carryover of safe swallow strategies   Pain     PRN Ultram 50mg  given at 2213 for H/A  Pain level at or below 2/10  Assess  pain Q shift and prn   Skin     Bruises to abd  Inspect skin daily for s/s of breakdown  Educate patient and family on the importance of skin care; assess for skin breakdown Q shift    Rehab Goals Patient on target to meet rehab goals: Yes Rehab Goals Revised: None *See Care Plan and progress notes for long and short-term goals.    Barriers to Discharge:  no 24/7 sup      Possible Resolutions to Barriers:    SNF with midline for fluids      Discharge Planning/Teaching Needs:    Pt does not have a 24/7 caregiver.  He will need SNF after his stay on CIR. Pt's son is investigating facilities and CSW will pursue his  preferences.   None at this time.    Team Discussion:    Pt passed his swallowing study and was put on a Dysphagia 1, honey thick liquids via teaspoon diet.  Team will need to focus on pt's caloric intake and pt will need ongoing hydration at night.  Pt is progressing well in therapies.     Revisions to Treatment Plan:    None    Continued Need for Acute Rehabilitation Level of Care: The patient requires daily medical management by a physician with specialized training in physical medicine and rehabilitation for the following conditions: Daily direction of a multidisciplinary physical rehabilitation program to ensure safe treatment while eliciting the highest outcome that is of practical value to the patient.: Yes Daily medical management of patient stability for increased activity during participation in an intensive rehabilitation regime.: Yes Daily analysis of laboratory values and/or radiology reports with any subsequent need for medication adjustment of medical intervention for : Neurological problems  Jacey Eckerson, Vista Deck 08/19/2013, 10:54 AM

## 2013-08-19 NOTE — Progress Notes (Signed)
Speech Language Pathology Daily Session Note  Patient Details  Name: Keith [name] MRN: [id number] Date of Birth: 04-17-1946  Today's Date: 08/19/2013 Time: 4098-1191 Time Calculation (min): 40 min  Short Term Goals: Week 2: SLP Short Term Goal 1 (Week 2): Pt will perform pharyngeal strengthening exercises with Min cues SLP Short Term Goal 2 (Week 2): Pt will consume 4oz of puree with Min verbal cues to utilite recommended safe swallow strategeis  SLP Short Term Goal 3 (Week 2): Pt will utilize speech intelligibility strategies with Supervision level verbal cues SLP Short Term Goal 4 (Week 2): Pt will consume Dys.1 textures and honey-thick liquids via teaspoon with Min cues to recall and utilize safe swallow strategies.  Skilled Therapeutic Interventions: Skilled treatment session focused on addressing dysphagia goals and education.  SLP facilitated session with breakfast tray and Min assist for tray set-up as well we Mod verbal cues to utilize recommended safe swallow strategies.  Patient consumed Dys.1 textures and honey-thick liquids via teaspoon with no overt s/s of aspiration due to clinician cues to utilize strategies.  Patient reported several times that he did not have anything in his mouth to swallow, SLP re-educated regarding need to swallow due to residue being located in his throat that he does not feel, but we saw during MBS.  Patient requested to have another MBS so that he can be upgraded to thin water and SLP educated regarding need for continued modification at this time as well as functional improvement prior to another objective assessment.    FIM:  Comprehension Comprehension Mode: Auditory Comprehension: 5-Understands basic 90% of the time/requires cueing < 10% of the time Expression Expression Mode: Verbal Expression: 4-Expresses basic 75 - 89% of the time/requires cueing 10 - 24% of the time. Needs helper to occlude trach/needs to repeat words. Social Interaction Social  Interaction: 5-Interacts appropriately 90% of the time - Needs monitoring or encouragement for participation or interaction. Problem Solving Problem Solving: 5-Solves basic 90% of the time/requires cueing < 10% of the time Memory Memory: 4-Recognizes or recalls 75 - 89% of the time/requires cueing 10 - 24% of the time FIM - Eating Eating Activity: 5: Supervision/cues  Pain Pain Assessment Pain Assessment: No/denies pain  Therapy/Group: Individual Therapy  Charlane Ferretti., CCC-SLP [phone]  Mariana Wiederholt 08/19/2013, 4:56 PM

## 2013-08-19 NOTE — Progress Notes (Signed)
Occupational Therapy Weekly Progress Note  Patient Details  Name: Keith [name] MRN: [id number] Date of Birth: Jan 26, 1946  Today's Date: 08/19/2013 Time: 1001-1102 Time Calculation (min): 61 min  Patient has met 3 of 4 short term goals.  Keith [name] continues to demonstrate progress with balance and coordination related to selfcare tasks.  He continues to need min assist for functional transfers and mobility in the room, without the use of an assistive device, when gathering items for bathing and dressing tasks.  He still demonstrates some mild ataxia in the LUE with functional use but is now able to fasten small buttons on his shirt and tie his shoes with increased time.  Ataxia is also noted in his trunk especially and pelvis especially when attempting to reach down to lower cabinets or the floor.  Recommend continued OT to help increase independence for selfcare tasks.  Plan for pt to transition to SNF as soon as a bed is available as pt does not have any supervision at home.  Feel he is on target for supervision level goals.   Patient continues to demonstrate the following deficits: decreased balance, decreased coordination, and therefore will continue to benefit from skilled OT intervention to enhance overall performance with BADL.  Patient progressing toward long term goals..  Continue plan of care.  OT Short Term Goals Week 3:  OT Short Term Goal 1 (Week 3): Pt will perform toilet transfers with supervision without use of assistive device. OT Short Term Goal 2 (Week 3): Pt will perform shower in standing with close supervision OT Short Term Goal 3 (Week 3): Pt will perforrm shower transfers with supervison and no assistive device.  Skilled Therapeutic Interventions/Progress Updates:    During OT session pt finished toileting as he was sitting on the elevated toilet when therapist came into the treatment.  He was able to perform his clothing management and hygiene with supervision.  Keith [name]  utilized the RW to ambulate around the room with min guard assist to gather his clothing and washcloth/towel before showering.  Needs mod instructional cueing to stay inside of the walker and not get outside of it during mobility or when attempting to retrieve items from lower drawers.  Pt bathed sit to stand with close supervision.  He did stand for all UB bathing including washing his hair but sat on the seat to wash his feet and lower legs.  He was able to perform all dressing sit to stand at the sink with supervision.  Pt was given honey thick apple juice to drink at end of session.  Pt able to feed himself teaspoons full and follow safe swallowing precautions.  Noted one episode of coughing.  Therapy Documentation Precautions:  Precautions Precautions: Fall Precaution Comments: ataxia in the LUE and LLE Restrictions Weight Bearing Restrictions: No  Pain: Pain Assessment Pain Assessment: No/denies pain ADL: See FIM for current functional status  Therapy/Group: Individual Therapy  Keith [name] OTR/L 08/19/2013, 12:28 PM

## 2013-08-20 ENCOUNTER — Inpatient Hospital Stay (HOSPITAL_COMMUNITY): Payer: Medicare Other | Admitting: Speech Pathology

## 2013-08-20 ENCOUNTER — Encounter (HOSPITAL_COMMUNITY): Payer: Medicare Other

## 2013-08-20 ENCOUNTER — Inpatient Hospital Stay (HOSPITAL_COMMUNITY): Payer: Medicare Other | Admitting: Rehabilitation

## 2013-08-20 ENCOUNTER — Encounter (HOSPITAL_COMMUNITY): Payer: Medicare Other | Admitting: Occupational Therapy

## 2013-08-20 LAB — GLUCOSE, CAPILLARY
Glucose-Capillary: 166 mg/dL — ABNORMAL HIGH (ref 70–99)
Glucose-Capillary: 142 mg/dL — ABNORMAL HIGH (ref 70–99)
Glucose-Capillary: 138 mg/dL — ABNORMAL HIGH (ref 70–99)
Glucose-Capillary: 176 mg/dL — ABNORMAL HIGH (ref 70–99)

## 2013-08-20 MED ORDER — ATORVASTATIN CALCIUM 80 MG PO TABS
80.0000 mg | ORAL_TABLET | Freq: Every day | ORAL | Status: DC
  Filled 2013-08-20: qty 1

## 2013-08-20 MED ORDER — AMLODIPINE BESYLATE 5 MG PO TABS
5.0000 mg | ORAL_TABLET | Freq: Every day | ORAL | Status: DC
  Administered 2013-08-20: 5 mg via ORAL
  Filled 2013-08-20 (×2): qty 1

## 2013-08-20 MED ORDER — FOLIC ACID 1 MG PO TABS
1.0000 mg | ORAL_TABLET | Freq: Every day | ORAL | Status: DC
  Administered 2013-08-20: 1 mg via ORAL
  Filled 2013-08-20 (×2): qty 1

## 2013-08-20 MED ORDER — LORAZEPAM 0.5 MG PO TABS: 0.5000 mg | ORAL_TABLET | ORAL | Status: DC | PRN

## 2013-08-20 MED ORDER — ADULT MULTIVITAMIN W/MINERALS CH
1.0000 | ORAL_TABLET | Freq: Every day | ORAL | Status: DC
  Administered 2013-08-20: 1 via ORAL
  Filled 2013-08-20 (×2): qty 1

## 2013-08-20 NOTE — Progress Notes (Signed)
Report called to [facility]. No questions noted. Pt states an understanding of transfer to SNF after lunch. Pamelia Hoit

## 2013-08-20 NOTE — Progress Notes (Signed)
Patient ID: Keith [name], male   DOB: 1945/09/22, 67 y.o.   MRN: [id number] 67 y.o. male with history of DM, HTN, who was admitted on 07/31/13 with left facial droop with dysarthria and one week history of imbalance as diplopia X 2 days. MRI/MRA brain done revealing two small acute brainstem infarcts in-right paracentral pons, and central to left paracentral medulla possibly involving left pyramid, no stenosis. 2D echo with EF 55-60% and grade 1 diastolic dysfunction. Carotid dopplers with 1-39% ICA stenosis. Neurology services consulted maintained on aspirin and Plavix therapy for CVA prophylaxis. Subcutaneous heparin added for DVT prophylaxis.Marland Kitchen MBS done 08/01/13 alternative nutritional means and NPO recommended    Subjective/Complaints: Tolerating po feeds without signs of aspiration Po fluid intake low now has midline Review of Systems - otherwise negative   Objective: Vital Signs: Blood pressure 134/71, pulse 66, temperature 97 F (36.1 C), temperature source Oral, resp. rate 19, weight 77.3 kg (170 lb 6.7 oz), SpO2 96.00%. No results found. Results for orders placed during the hospital encounter of 08/05/13 (from the past 72 hour(s))  GLUCOSE, CAPILLARY     Status: Abnormal   Collection Time    08/17/13  4:27 PM      Result Value Range   Glucose-Capillary 141 (*) 70 - 99 mg/dL   Comment 1 Notify RN    GLUCOSE, CAPILLARY     Status: Abnormal   Collection Time    08/17/13  9:11 PM      Result Value Range   Glucose-Capillary 159 (*) 70 - 99 mg/dL  GLUCOSE, CAPILLARY     Status: Abnormal   Collection Time    08/18/13 12:00 AM      Result Value Range   Glucose-Capillary 134 (*) 70 - 99 mg/dL  GLUCOSE, CAPILLARY     Status: Abnormal   Collection Time    08/18/13  4:04 AM      Result Value Range   Glucose-Capillary 121 (*) 70 - 99 mg/dL  GLUCOSE, CAPILLARY     Status: Abnormal   Collection Time    08/18/13  9:13 AM      Result Value Range   Glucose-Capillary 146 (*) 70 - 99 mg/dL   GLUCOSE, CAPILLARY     Status: Abnormal   Collection Time    08/18/13  2:34 PM      Result Value Range   Glucose-Capillary 125 (*) 70 - 99 mg/dL  GLUCOSE, CAPILLARY     Status: Abnormal   Collection Time    08/18/13  4:10 PM      Result Value Range   Glucose-Capillary 134 (*) 70 - 99 mg/dL   Comment 1 Notify RN    GLUCOSE, CAPILLARY     Status: Abnormal   Collection Time    08/18/13  8:25 PM      Result Value Range   Glucose-Capillary 198 (*) 70 - 99 mg/dL   Comment 1 Notify RN    GLUCOSE, CAPILLARY     Status: Abnormal   Collection Time    08/19/13 12:07 AM      Result Value Range   Glucose-Capillary 133 (*) 70 - 99 mg/dL   Comment 1 Notify RN    GLUCOSE, CAPILLARY     Status: Abnormal   Collection Time    08/19/13  4:00 AM      Result Value Range   Glucose-Capillary 142 (*) 70 - 99 mg/dL   Comment 1 Notify RN    GLUCOSE, CAPILLARY  Status: Abnormal   Collection Time    08/19/13  7:52 AM      Result Value Range   Glucose-Capillary 135 (*) 70 - 99 mg/dL   Comment 1 Notify RN    GLUCOSE, CAPILLARY     Status: Abnormal   Collection Time    08/19/13 11:49 AM      Result Value Range   Glucose-Capillary 164 (*) 70 - 99 mg/dL   Comment 1 Notify RN    GLUCOSE, CAPILLARY     Status: Abnormal   Collection Time    08/19/13  4:35 PM      Result Value Range   Glucose-Capillary 147 (*) 70 - 99 mg/dL  GLUCOSE, CAPILLARY     Status: Abnormal   Collection Time    08/19/13  9:07 PM      Result Value Range   Glucose-Capillary 184 (*) 70 - 99 mg/dL  GLUCOSE, CAPILLARY     Status: Abnormal   Collection Time    08/20/13  1:38 AM      Result Value Range   Glucose-Capillary 166 (*) 70 - 99 mg/dL  GLUCOSE, CAPILLARY     Status: Abnormal   Collection Time    08/20/13  4:20 AM      Result Value Range   Glucose-Capillary 142 (*) 70 - 99 mg/dL  GLUCOSE, CAPILLARY     Status: Abnormal   Collection Time    08/20/13  7:43 AM      Result Value Range   Glucose-Capillary 138 (*)  70 - 99 mg/dL     HEENT: normal Cardio: RRR and no murmur Resp: CTA B/L and unlabored GI: BS positive and non distended Extremity:  Pulses positive and No Edema Skin:   Intact Neuro: a little slow this am, Cranial Nerve Abnormalities Left central 7, Abnormal Motor 3-/5 LUE, 3-/5 LLE, 5/5 on  Right side, Abnormal FMC Ataxic/ dec FMC and Dysarthric. Mod/Severe dymetria left FNF and Left Heel to Shin Musc/Skel:  Normal GEN: NAD   Assessment/Plan: 1. Functional deficits secondary to Right pontine and Left medullary infarcts which require 3+ hours per day of interdisciplinary therapy in a comprehensive inpatient rehab setting. Medically stable for D/C to SNF  FIM: FIM - Bathing Bathing Steps Patient Completed: Chest;Right Arm;Left Arm;Abdomen;Front perineal area;Buttocks;Right upper leg;Left upper leg;Right lower leg (including foot);Left lower leg (including foot) Bathing: 5: Supervision: Safety issues/verbal cues  FIM - Upper Body Dressing/Undressing Upper body dressing/undressing steps patient completed: Pull shirt over trunk;Thread/unthread right sleeve of front closure shirt/dress;Thread/unthread left sleeve of front closure shirt/dress;Pull shirt around back of front closure shirt/dress;Button/unbutton shirt Upper body dressing/undressing: 5: Supervision: Safety issues/verbal cues FIM - Lower Body Dressing/Undressing Lower body dressing/undressing steps patient completed: Thread/unthread right underwear leg;Thread/unthread left underwear leg;Pull underwear up/down;Thread/unthread right pants leg;Thread/unthread left pants leg;Pull pants up/down;Fasten/unfasten pants;Don/Doff right sock;Don/Doff left sock;Don/Doff right shoe;Don/Doff left shoe;Fasten/unfasten right shoe;Fasten/unfasten left shoe Lower body dressing/undressing: 5: Supervision: Safety issues/verbal cues  FIM - Toileting Toileting steps completed by patient: Adjust clothing prior to toileting;Performs perineal  hygiene;Adjust clothing after toileting Toileting Assistive Devices: Grab bar or rail for support Toileting: 4: Steadying assist  FIM - Diplomatic Services operational officer Devices: Grab bars Toilet Transfers: 4-To toilet/BSC: Min A (steadying Pt. > 75%);4-From toilet/BSC: Min A (steadying Pt. > 75%)  FIM - Bed/Chair Transfer Bed/Chair Transfer Assistive Devices: Bed rails;Arm rests Bed/Chair Transfer: 0: Activity did not occur  FIM - Locomotion: Wheelchair Distance: 155 Locomotion: Wheelchair: 5: Travels 150 ft or more:  maneuvers on rugs and over door sills with supervision, cueing or coaxing FIM - Locomotion: Ambulation Locomotion: Ambulation Assistive Devices: Walker - Rolling Ambulation/Gait Assistance: 4: Min assist Locomotion: Ambulation: 4: Travels 150 ft or more with minimal assistance (Pt.>75%)  Comprehension Comprehension Mode: Auditory Comprehension: 5-Understands basic 90% of the time/requires cueing < 10% of the time  Expression Expression Mode: Verbal Expression: 5-Expresses basic 90% of the time/requires cueing < 10% of the time.  Social Interaction Social Interaction: 5-Interacts appropriately 90% of the time - Needs monitoring or encouragement for participation or interaction.  Problem Solving Problem Solving: 5-Solves basic 90% of the time/requires cueing < 10% of the time  Memory Memory: 4-Recognizes or recalls 75 - 89% of the time/requires cueing 10 - 24% of the time  Medical Problem List and Plan:  1. Thrombotic left medullary, right paracentral pontine infarct  2. DVT Prophylaxis/Anticoagulation: Subcutaneous heparin. Monitor platelet counts and any signs of bleeding  3. Pain Management: Tylenol as needed for headache--this has been effective 4. Mood: Zoloft 50 mg daily, Ativan 0.5 mg every 4 as needed anxiety , held secondary to NPO 5. Neuropsych: This patient is capable of making decisions on his own behalf.  6. Dysphagia. Patient currently on  D1 and honey thick hold off on PEG,oral fluids yesterday  place midline 7. Hypertension. Norvasc 5 mg daily.  Borderline controlled at present  8. Hyperlipidemia. Lipitor  9. Diabetes mellitus with peripheral neuropathy. Controlled Hemoglobin A1c 7.4. Continue sliding scale insulin, resume Glucophage 500 mg to twice a day      LOS (Days) 15 A FACE TO FACE EVALUATION WAS PERFORMED  KIRSTEINS,ANDREW E 08/20/2013, 7:53 AM

## 2013-08-20 NOTE — Discharge Summary (Signed)
NAME:  Keith [name], Keith [name] NO.:  [id number]  MEDICAL RECORD NO.:  [id number]  LOCATION:  4W26C                        FACILITY:  [facility]  PHYSICIAN:  Erick Colace, M.D.DATE OF BIRTH:  1945/12/11  DATE OF ADMISSION:  08/05/2013 DATE OF DISCHARGE:  08/20/2013                              DISCHARGE SUMMARY   DISCHARGE DIAGNOSES: 1. 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 2. Subcutaneous heparin for deep vein thrombosis prophylaxis. 3. Depression. 4. Dysphagia. 5. Hypertension. 6. Hyperlipidemia. 7. Diabetes mellitus with peripheral neuropathy.  HISTORY OF PRESENT ILLNESS:  This is a 67 year old right-handed male, admitted on July 31, 2013, with left facial droop and dysarthric speech x1 week.  MRI, MRA of the brain revealed two small acute brainstem infarcts in the right paracentral pons and central-to-left paracentral medulla, possibly involving the left pyramid, no stenosis. Echocardiogram with ejection fraction of 60% and grade 1 diastolic dysfunction.  Carotid Dopplers with no ICA stenosis.  Neurology Service was consulted, maintained on Plavix, aspirin therapy.  He initially had received subcutaneous heparin for DVT prophylaxis.  Modified barium swallow on August 01, 2013, advised the patient to be n.p.o. secondary to dysphagia, and nasogastric tube was placed for nutritional support. Physical, occupational, speech therapy ongoing,  the patient was admitted for comprehensive rehab program.  PAST MEDICAL HISTORY:  See discharge diagnoses.  SOCIAL HISTORY:  Lives alone.  FUNCTIONAL HISTORY:  Prior to admission, independent.  Functional status upon admission to Rehab Services, moderate assist to ambulate 100 feet with a rolling walker.  PHYSICAL EXAMINATION:  VITAL SIGNS:  Blood pressure 144/83, pulse 66, temperature 97.4, respirations 18. GENERAL:  This was an alert male, pupils round and reactive to light. Speech was  dysarthric with a wet voice, but intelligible.  He had a left facial droop.  He tends to look to the right. HEENT:  Pupils round and reactive to light. LUNGS:  Clear to auscultation. CARDIAC:  Regular rate and rhythm.  REHABILITATION HOSPITAL COURSE:  The patient was admitted to Inpatient Rehab Services with therapies initiated on a 3-hour daily basis, consisting of physical therapy, occupational therapy, speech therapy, and rehabilitation nursing.  The following issues were addressed during the patient's rehabilitation stay.  Pertaining to Mr. Torregrossa thrombotic left medullary right paracentral pontine infarction, remained stable, maintained on aspirin, Plavix therapy per Neurology Services. Subcutaneous heparin for DVT prophylaxis.  No bleeding episodes.  He did have a history of depression, anxiety.  He remained on Zoloft with emotional support provided.  Dysphagia secondary to CVA followed by speech therapy, initially with nasogastric tube for nutritional support, n.p.o.  There was some anticipation of possible PEG tube being needed, however, the patient's nasogastric tube did come out, requested followup swallow study which was completed and advanced on August 17, 2013, to a dysphagia I honey thick liquid diet.  The patient was quite adamant at this time about not receiving a PEG tube.  A midline catheter was placed to maintain hydration while on honey liquids.  His blood pressure remained controlled and monitored with the use of Norvasc.  He did have a history of diabetes mellitus, peripheral neuropathy, hemoglobin A1c 7.4 on Glucophage 500 mg  b.i.d.  The patient received weekly collaborative interdisciplinary team conferences to discuss estimated length of stay, family teaching, and any barriers to his discharge.  He can self-propel his wheelchair supervision level in order to increase his coordination.  Performed gait training in the hallway to stairs with min guard to min  assist with continued cues for increased step when making turns.  The patient is able to maintain balance with min guard, however, occasional loss of balance.  He can ambulate back to his room, pushing his wheelchair, again continuing to work on coordination and keeping wheelchair in a straight path.  He was able to communicate his needs without any difficulty.  Due to limited support at home, it was felt skilled nursing facility was needed, and the patient discharged to a skilled center.  DISCHARGE MEDICATIONS: 1. Norvasc 5 mg p.o. daily. 2. Aspirin 325 mg p.o. daily. 3. Lipitor 80 mg p.o. daily. 4. Plavix 75 mg p.o. daily. 5. Flonase two sprays in each nostril twice daily. 6. Folic acid 1 mg p.o. daily. 7. Ativan 0.5 mg p.o. every 4 hours as needed for anxiety. 8. Glucophage 500 mg p.o. b.i.d. 9. Multivitamin 1 tablet p.o. daily. 10.Zoloft 50 mg p.o. daily. 11.Ultram 50 mg p.o. every 6 hours as needed for moderate pain.  DIET:  Dysphagia I honey thick liquids with 0.5% sodium chloride infusion at 75 mL an hour from 7 p.m. to 7 a.m. daily by a midline catheter.  FOLLOWUP:  The patient will follow up with Dr. Claudette Laws at the outpatient rehab center as directed.     Mariam Dollar, P.A.   ______________________________ Erick Colace, M.D.    DA/MEDQ  D:  08/20/2013  T:  08/20/2013  Job:  [id number]  cc:   Delaney Meigs, M.D. Pramod P. Pearlean Brownie, MD

## 2013-08-20 NOTE — Discharge Summary (Signed)
Discharge summary job [id number]

## 2013-08-20 NOTE — Progress Notes (Signed)
Occupational Therapy Discharge Summary  Patient Details  Name: Keith [name] MRN: [id number] Date of Birth: 1946-02-14  Today's Date: 08/20/2013 Time: 1002-1058 Time Calculation (min): 56 min  Session Note:  During session pt utilized RW with close supervision to gather all supplies and clothing needed for his sponge bath.  Performed bathing and dressing sit to stand at the sink with supervision as well.  Noted pt able to perform all buttoning and tying with slight increased time but no assistance needed.  Pt still with some ataxic movement with dynamic tasks especially reaching down to lower drawers, but no LOB noted when using the RW for support.    Patient has met 9 of 9 long term goals due to improved activity tolerance, improved balance, postural control, functional use of  LEFT upper extremity and improved coordination.  Patient to discharge at overall Supervision level.  Pt plans to discharge to SNF for continued rehab as he is at a supervision level overall but does not having 24 hour supervision.  Feel he needs continued rehab to increase LUE function and coordination as well as balance during selfcare tasks.  Reasons goals not met: NA  Recommendation:  Patient will benefit from ongoing skilled OT services in skilled nursing facility setting to continue to advance functional skills in the area of BADL and iADL.  Equipment: No equipment provided  Reasons for discharge: treatment goals met and discharge from hospital  Patient/family agrees with progress made and goals achieved: Yes  OT Discharge Precautions/Restrictions  Precautions Precautions: Fall Precaution Comments: ataxia in the LUE and LLE Restrictions Weight Bearing Restrictions: No  Pain Pain Assessment Pain Assessment: No/denies pain ADL  See FIM scale for details  Vision/Perception  Vision - History Baseline Vision: Wears glasses only for reading Visual History: Cataracts Patient Visual Report: Other  (comment) (He reports having increased blurriness at times.) Vision - Assessment Eye Alignment: Within Functional Limits Vision Assessment: Vision tested Ocular Range of Motion: Within Functional Limits Tracking/Visual Pursuits: Decreased smoothness of horizontal tracking;Decreased smoothness of vertical tracking Saccades: Within functional limits Visual Fields: No apparent deficits Perception Perception: Within Functional Limits Praxis Praxis: Intact  Cognition Overall Cognitive Status: Impaired/Different from baseline Arousal/Alertness: Awake/alert Orientation Level: Oriented X4 Attention: Sustained;Selective Sustained Attention: Appears intact Sustained Attention Impairment: Functional complex Selective Attention: Appears intact Memory: Impaired Memory Impairment: Decreased recall of new information;Decreased short term memory Awareness: Appears intact Problem Solving: Appears intact Safety/Judgment: Appears intact Comments: Pt demonstrates good safety awareness during sessions.  Sensation Sensation Light Touch: Appears Intact Stereognosis: Appears Intact Hot/Cold: Appears Intact Proprioception: Appears Intact Coordination Gross Motor Movements are Fluid and Coordinated: No Fine Motor Movements are Fluid and Coordinated: No Coordination and Movement Description: Pt still with slight ataxia in his LUE however it has decreased significantly since initial evaluation.  Pt is now able to button his own shirts and tie his own shoes.   Motor  Motor Motor: Ataxia Motor - Discharge Observations: Pt still with slight increased ataxia in his pelvis and LUE.   Mobility  Bed Mobility Bed Mobility: Supine to Sit;Sit to Supine Supine to Sit: 5: Supervision Supine to Sit Details: Verbal cues for precautions/safety Sitting - Scoot to Edge of Bed: 5: Supervision Sitting - Scoot to Edge of Bed Details: Verbal cues for precautions/safety Sit to Supine: 5: Supervision Sit to Supine -  Details: Verbal cues for precautions/safety Transfers Transfers: Sit to Stand Sit to Stand: 5: Supervision;With armrests;From toilet Sit to Stand Details: Verbal cues for precautions/safety;Verbal cues for technique  Stand to Sit: 5: Supervision;To chair/3-in-1 Stand to Sit Details (indicate cue type and reason): Verbal cues for precautions/safety;Verbal cues for technique  Trunk/Postural Assessment  Cervical Assessment Cervical Assessment: Within Functional Limits Thoracic Assessment Thoracic Assessment: Within Functional Limits Lumbar Assessment Lumbar Assessment: Within Functional Limits Postural Control Postural Limitations: Continue to note decreased postural control, esp when changing directions with LOB posteriorly.   Balance Balance Balance Assessed: Yes Dynamic Sitting Balance Dynamic Sitting - Balance Support: No upper extremity supported Dynamic Sitting - Level of Assistance: 6: Modified independent (Device/Increase time) Static Standing Balance Static Standing - Balance Support: No upper extremity supported Static Standing - Level of Assistance: 5: Stand by assistance Dynamic Standing Balance Dynamic Standing - Balance Support: Right upper extremity supported;Left upper extremity supported Dynamic Standing - Level of Assistance: 5: Stand by assistance Extremity/Trunk Assessment RUE Assessment RUE Assessment: Within Functional Limits LUE Strength LUE Overall Strength Comments: AROM shoulder flexion 0-110 degrees, pt with history of shoulder dysfunction recently.  All other joints AROM WFLS.  Strength is overall 3+/5 throughout.  Slight decreased FM and gross motor coordination with slight ataxia present as well.     See FIM for current functional status  Mutasim Tuckey OTR/L 08/20/2013, 12:32 PM

## 2013-08-20 NOTE — Progress Notes (Signed)
Speech Language Pathology Daily Session Note & Discharge Summary  Patient Details  Name: Keith [name] MRN: [id number] Date of Birth: December 19, 1945  Today's Date: 08/20/2013 Time: 8413-2440 Time Calculation (min): 40 min  Short Term Goals: Week 2: SLP Short Term Goal 1 (Week 2): Pt will perform pharyngeal strengthening exercises with Min cues SLP Short Term Goal 2 (Week 2): Pt will consume 4oz of puree with Min verbal cues to utilite recommended safe swallow strategeis  SLP Short Term Goal 3 (Week 2): Pt will utilize speech intelligibility strategies with Supervision level verbal cues SLP Short Term Goal 4 (Week 2): Pt will consume Dys.1 textures and honey-thick liquids via teaspoon with Min cues to recall and utilize safe swallow strategies.  Skilled Therapeutic Interventions: Skilled treatment session focused on addressing dysphagia goals and wrap-up of education prior to discharge to next level of care.  SLP facilitated session with breakfast tray and increased wait time for set-up as well as Supervision level verbal verbal cues to utilize recommended safe swallow strategies.  Patient consumed Dys.1 textures and honey-thick liquids via teaspoon with improved self-monitoring and correcting of pacing; patient exhibited no overt s/s of aspiration during p.o. intake.  Of note, patient with coughing post nasal spray administration today with report of a bad taste in his thraot.  SLP educated patient regarding need for continued diet modification at this time along with the importance of use of safe swallow compensatory strategies to reduce aspiration risk.  Patient asked appropriate questions regarding therapy at the next level of care.     FIM:  Comprehension Comprehension Mode: Auditory Comprehension: 5-Understands basic 90% of the time/requires cueing < 10% of the time Expression Expression Mode: Verbal Expression: 5-Expresses basic 90% of the time/requires cueing < 10% of the time. Social  Interaction Social Interaction: 5-Interacts appropriately 90% of the time - Needs monitoring or encouragement for participation or interaction. Problem Solving Problem Solving: 5-Solves basic 90% of the time/requires cueing < 10% of the time Memory Memory: 5-Recognizes or recalls 90% of the time/requires cueing < 10% of the time FIM - Eating Eating Activity: 5: Supervision/cues  Pain Pain Assessment Pain Assessment: No/denies pain  Therapy/Group: Individual Therapy   Speech Language Pathology Discharge Summary  Patient Details  Name: Keith [name] MRN: [id number] Date of Birth: Aug 20, 1946  Today's Date: 08/20/2013  Patient has met 3 of 3 long term goals.  Patient to discharge at overall Supervision level.  Reasons goals not met: n/a   Clinical Impression/Discharge Summary: Patient met 3 out of 3 long term goals during his CIR stay due to gains in ability to recall and utilize pharyngeal strengthening exercises, speech intelligibility strategies and achieve volitional swallows.   As a result, patient was progressed from NPO to consuming an oral diet of Dys.1 textures and honey-thick liquids via teasppon or small cup sip with Supervision level verbal cues.  Given continued moderate dysphagia and mild dysarthria this patient continues to require skilled SLP services to address these deficits and maximize function prior to discharge home.   Care Partner:  Caregiver Able to Provide Assistance: No  Type of Caregiver Assistance: Physical;Cognitive (Dysphagia)  Recommendation:  24 hour supervision/assistance;Skilled Nursing facility  Rationale for SLP Follow Up: Maximize functional communication;Maximize swallowing safety;Reduce caregiver burden   Equipment: Thickener  Reasons for discharge: Treatment goals met;Discharged from hospital   Patient/Family Agrees with Progress Made and Goals Achieved: Yes   See FIM for current functional status  Keith [name].,  CCC-SLP [phone]  Keith [name] 08/20/2013, 9:03 AM

## 2013-08-20 NOTE — Plan of Care (Signed)
Problem: RH Ambulation Goal: LTG Patient will ambulate in controlled environment (PT) LTG: Patient will ambulate in a controlled environment, # of feet with assistance (PT).  Outcome: Not Met (add Reason) Pt min/guard assist when making turns with RW, min assist without RW Goal: LTG Patient will ambulate in home environment (PT) LTG: Patient will ambulate in home environment, # of feet with assistance (PT).  Outcome: Not Met (add Reason) Pt min/guard assist when making turns with RW, min assist without RW

## 2013-08-20 NOTE — Progress Notes (Signed)
Physical Therapy Discharge Summary  Patient Details  Name: Keith [name] MRN: [id number] Date of Birth: [birth year]  Today's Date: 08/20/2013 Time Calculation (min): 56 min  Patient has met 5 of 7 long term goals due to improved activity tolerance, improved balance, improved postural control, increased strength, ability to compensate for deficits, functional use of  left upper extremity and left lower extremity, improved attention, improved awareness and improved coordination.  Patient to discharge at an ambulatory level Min Assist.   Pt to D/C to SNF, therefore did not perform formal family training at this time and will defer to SNF if he is to need any.   Reasons goals not met: Pt requires min/guard assist when ambulating with RW, esp when making turns due to LOB with scissored gait pattern.  Requires min assist without RW due to ataxic movements.   Recommendation:  Patient will benefit from ongoing skilled PT services in skilled nursing facility setting to continue to advance safe functional mobility, address ongoing impairments in decreased balance, decreased coordination, decreased overall strength L>R, decreased memory, decreased safety, and minimize fall risk.  Equipment: No equipment provided  Reasons for discharge: discharge from hospital  Patient/family agrees with progress made and goals achieved: Yes  PT Discharge Precautions/Restrictions Precautions Precautions: Fall Precaution Comments: ataxia in the LUE and LLE Restrictions Weight Bearing Restrictions: No   Pain Pain Assessment Pain Assessment: No/denies pain Vision/Perception  Vision - History Baseline Vision: Wears glasses only for reading Visual History: Cataracts Patient Visual Report: Other (comment) (He reports having increased blurriness at times.) Vision - Assessment Eye Alignment: Within Functional Limits Vision Assessment: Vision tested Ocular Range of Motion: Within Functional Limits Tracking/Visual  Pursuits: Decreased smoothness of horizontal tracking;Decreased smoothness of vertical tracking Saccades: Within functional limits Visual Fields: No apparent deficits Perception Perception: Within Functional Limits Praxis Praxis: Intact  Cognition Overall Cognitive Status: Impaired/Different from baseline Arousal/Alertness: Awake/alert Orientation Level: Oriented X4 Attention: Sustained;Selective Sustained Attention: Appears intact Sustained Attention Impairment: Functional complex Selective Attention: Appears intact Memory: Impaired Memory Impairment: Decreased recall of new information;Decreased short term memory Awareness: Appears intact Problem Solving: Appears intact Safety/Judgment: Appears intact Comments: Pt demonstrates good safety awareness during sessions.  Sensation Sensation Light Touch: Appears Intact Stereognosis: Appears Intact Hot/Cold: Appears Intact Proprioception: Appears Intact Coordination Gross Motor Movements are Fluid and Coordinated: No Fine Motor Movements are Fluid and Coordinated: No Coordination and Movement Description: Pt continues to have slight ataxia in LLE as well as LUE, however is significantly decreased since time of evaluation.   Motor  Motor Motor: Ataxia Motor - Discharge Observations: Pt still with slight increased ataxia in his pelvis and LUE.    Mobility Bed Mobility Bed Mobility: Supine to Sit;Sit to Supine Supine to Sit: 5: Supervision Supine to Sit Details: Verbal cues for precautions/safety Sitting - Scoot to Edge of Bed: 5: Supervision Sitting - Scoot to Edge of Bed Details: Verbal cues for precautions/safety Sit to Supine: 5: Supervision Sit to Supine - Details: Verbal cues for precautions/safety Transfers Sit to Stand: 5: Supervision;With armrests;From toilet Sit to Stand Details: Verbal cues for precautions/safety;Verbal cues for technique Stand to Sit: 5: Supervision;To chair/3-in-1 Stand to Sit Details (indicate  cue type and reason): Verbal cues for precautions/safety;Verbal cues for technique Stand Pivot Transfers: 5: Supervision Stand Pivot Transfer Details: Verbal cues for sequencing;Verbal cues for technique;Verbal cues for precautions/safety Locomotion  Ambulation Ambulation: Yes (as per session on 12/18) Ambulation/Gait Assistance: 4: Min guard Ambulation Distance (Feet): 155 Feet Assistive device: Rolling walker (with and  without RW) Ambulation/Gait Assistance Details: Verbal cues for sequencing;Verbal cues for technique;Verbal cues for precautions/safety;Verbal cues for gait pattern;Verbal cues for safe use of DME/AE;Manual facilitation for weight shifting Gait Gait: Yes Gait Pattern: Impaired Gait Pattern: Step-through pattern;Trunk flexed;Narrow base of support;Ataxic;Decreased stride length Stairs / Additional Locomotion Stairs: Yes (as per previous sessions) Stairs Assistance: 4: Min assist Stairs Assistance Details: Verbal cues for sequencing;Verbal cues for technique;Verbal cues for precautions/safety;Verbal cues for gait pattern;Manual facilitation for weight shifting Stair Management Technique: Step to pattern;Forwards;One rail Right Height of Stairs: 6 Wheelchair Mobility Wheelchair Mobility: Yes Wheelchair Assistance: 5: Financial planner Details: Verbal cues for Diplomatic Services operational officer: Both lower extermities;Both upper extremities Wheelchair Parts Management: Supervision/cueing  Trunk/Postural Assessment  Cervical Assessment Cervical Assessment: Within Functional Limits Thoracic Assessment Thoracic Assessment: Within Functional Limits Lumbar Assessment Lumbar Assessment: Within Functional Limits Postural Control Postural Limitations: Continue to note decreased postural control, esp when changing directions with LOB posteriorly.   Balance Balance Balance Assessed: Yes Dynamic Sitting Balance Dynamic Sitting - Balance Support: No upper  extremity supported Dynamic Sitting - Level of Assistance: 6: Modified independent (Device/Increase time) Static Standing Balance Static Standing - Balance Support: No upper extremity supported Static Standing - Level of Assistance: 5: Stand by assistance Dynamic Standing Balance Dynamic Standing - Balance Support: Right upper extremity supported;Left upper extremity supported Dynamic Standing - Level of Assistance: 5: Stand by assistance Extremity Assessment  RUE Assessment RUE Assessment: Within Functional Limits LUE Strength LUE Overall Strength Comments: AROM shoulder flexion 0-110 degrees, pt with history of shoulder dysfunction recently.  All other joints AROM WFLS.  Strength is overall 3+/5 throughout.  Slight decreased FM and gross motor coordination with slight ataxia present as well.    RLE Assessment RLE Assessment: Within Functional Limits LLE Assessment LLE Assessment: Exceptions to [facility] LLE Strength LLE Overall Strength Comments: grossly WFL, however demonstrates some dysmetria with movements.   See FIM for current functional status  Vista Deck 08/20/2013, 2:08 PM

## 2013-08-20 NOTE — Progress Notes (Signed)
Social Work Patient ID: Keith [name], male   DOB: 16-Dec-1945, 67 y.o.   MRN: [id number]  Received SNF bed offer yesterday afternoon from [facility].  Staci Acosta, LCSW contacted son and pt and both have accepted bed offer for transfer today.   MD and team aware.  Plan to d/c after lunch.  Thayer Inabinet, LCSW

## 2013-08-23 NOTE — Progress Notes (Signed)
Social Work Discharge Note  The overall goal for the admission was met for:   Discharge location: Yes - tx to SNF due to no 24/7 caregiver available  Length of Stay: Yes - 15 days  Discharge activity level: Yes - min assist  Home/community participation: No - transfer to SNF  Services provided included: MD, RD, PT, OT, SLP, RN, TR, Pharmacy and SW  Financial Services: Medicare and Private Insurance: [place]  Follow-up services arranged: Other: Transferred to [facility] [place] via family vehicle  Comments (or additional information):  Patient/Family verbalized understanding of follow-up arrangements: Yes  Individual responsible for coordination of the follow-up plan: SNF and pt's family  Confirmed correct DME delivered: Elvera Lennox 08/23/2013    Limuel Nieblas, Vista Deck

## 2013-09-14 ENCOUNTER — Telehealth: Payer: Self-pay

## 2013-09-14 NOTE — Telephone Encounter (Signed)
Patient's EC called and would like for someone to give him a call back regarding the patient.

## 2013-09-15 NOTE — Telephone Encounter (Signed)
Returned EC call, left message.

## 2013-09-16 ENCOUNTER — Observation Stay (HOSPITAL_COMMUNITY): Payer: Medicare Other

## 2013-09-16 ENCOUNTER — Inpatient Hospital Stay (HOSPITAL_COMMUNITY)
Admission: EM | Admit: 2013-09-16 | Discharge: 2013-09-17 | DRG: 066 | Disposition: A | Discharge status: Home Health | Payer: Medicare Other | Attending: Internal Medicine | Admitting: Internal Medicine

## 2013-09-16 ENCOUNTER — Encounter (HOSPITAL_COMMUNITY): Payer: Self-pay | Admitting: Emergency Medicine

## 2013-09-16 ENCOUNTER — Emergency Department (HOSPITAL_COMMUNITY): Payer: Medicare Other

## 2013-09-16 DIAGNOSIS — IMO0002 Reserved for concepts with insufficient information to code with codable children: Secondary | ICD-10-CM

## 2013-09-16 DIAGNOSIS — Z7982 Long term (current) use of aspirin: Secondary | ICD-10-CM

## 2013-09-16 DIAGNOSIS — F341 Dysthymic disorder: Secondary | ICD-10-CM | POA: Diagnosis present

## 2013-09-16 DIAGNOSIS — I10 Essential (primary) hypertension: Secondary | ICD-10-CM

## 2013-09-16 DIAGNOSIS — Z79899 Other long term (current) drug therapy: Secondary | ICD-10-CM

## 2013-09-16 DIAGNOSIS — R5381 Other malaise: Secondary | ICD-10-CM

## 2013-09-16 DIAGNOSIS — R5383 Other fatigue: Secondary | ICD-10-CM

## 2013-09-16 DIAGNOSIS — E119 Type 2 diabetes mellitus without complications: Secondary | ICD-10-CM

## 2013-09-16 DIAGNOSIS — Z87891 Personal history of nicotine dependence: Secondary | ICD-10-CM

## 2013-09-16 DIAGNOSIS — F418 Other specified anxiety disorders: Secondary | ICD-10-CM | POA: Diagnosis present

## 2013-09-16 DIAGNOSIS — H532 Diplopia: Secondary | ICD-10-CM | POA: Diagnosis present

## 2013-09-16 DIAGNOSIS — I639 Cerebral infarction, unspecified: Secondary | ICD-10-CM

## 2013-09-16 DIAGNOSIS — R471 Dysarthria and anarthria: Secondary | ICD-10-CM | POA: Diagnosis present

## 2013-09-16 DIAGNOSIS — Z8673 Personal history of transient ischemic attack (TIA), and cerebral infarction without residual deficits: Secondary | ICD-10-CM

## 2013-09-16 DIAGNOSIS — I6350 Cerebral infarction due to unspecified occlusion or stenosis of unspecified cerebral artery: Principal | ICD-10-CM | POA: Diagnosis present

## 2013-09-16 DIAGNOSIS — R531 Weakness: Secondary | ICD-10-CM

## 2013-09-16 HISTORY — DX: Cerebral infarction, unspecified: I63.9

## 2013-09-16 LAB — RAPID URINE DRUG SCREEN, HOSP PERFORMED
AMPHETAMINES: NOT DETECTED
BARBITURATES: NOT DETECTED
Benzodiazepines: NOT DETECTED
COCAINE: NOT DETECTED
OPIATES: NOT DETECTED
TETRAHYDROCANNABINOL: NOT DETECTED

## 2013-09-16 LAB — URINALYSIS, ROUTINE W REFLEX MICROSCOPIC
BILIRUBIN URINE: NEGATIVE
Glucose, UA: 500 mg/dL — AB
Hgb urine dipstick: NEGATIVE
Ketones, ur: NEGATIVE mg/dL
Leukocytes, UA: NEGATIVE
Nitrite: NEGATIVE
PROTEIN: NEGATIVE mg/dL
Specific Gravity, Urine: 1.015 (ref 1.005–1.030)
UROBILINOGEN UA: 0.2 mg/dL (ref 0.0–1.0)
pH: 6 (ref 5.0–8.0)

## 2013-09-16 LAB — COMPREHENSIVE METABOLIC PANEL
ALBUMIN: 4.1 g/dL (ref 3.5–5.2)
ALT: 20 U/L (ref 0–53)
AST: 17 U/L (ref 0–37)
Alkaline Phosphatase: 66 U/L (ref 39–117)
BUN: 11 mg/dL (ref 6–23)
CALCIUM: 10.1 mg/dL (ref 8.4–10.5)
CO2: 27 mEq/L (ref 19–32)
CREATININE: 0.75 mg/dL (ref 0.50–1.35)
Chloride: 96 mEq/L (ref 96–112)
GFR calc Af Amer: >90 mL/min (ref >90)
GFR calc non Af Amer: >90 mL/min (ref >90)
Glucose, Bld: 287 mg/dL — ABNORMAL HIGH (ref 70–99)
Potassium: 4.4 mEq/L (ref 3.7–5.3)
SODIUM: 139 meq/L (ref 137–147)
Total Bilirubin: 0.7 mg/dL (ref 0.3–1.2)
Total Protein: 7.7 g/dL (ref 6.0–8.3)

## 2013-09-16 LAB — CBC
HEMATOCRIT: 42.8 % (ref 39.0–52.0)
Hemoglobin: 15.1 g/dL (ref 13.0–17.0)
MCH: 34.2 pg — ABNORMAL HIGH (ref 26.0–34.0)
MCHC: 35.3 g/dL (ref 30.0–36.0)
MCV: 96.8 fL (ref 78.0–100.0)
Platelets: 229 10*3/uL (ref 150–400)
RBC: 4.42 MIL/uL (ref 4.22–5.81)
RDW: 11.9 % (ref 11.5–15.5)
WBC: 8.1 10*3/uL (ref 4.0–10.5)

## 2013-09-16 LAB — DIFFERENTIAL
BASOS PCT: 0 % (ref 0–1)
Basophils Absolute: 0 10*3/uL (ref 0.0–0.1)
EOS PCT: 1 % (ref 0–5)
Eosinophils Absolute: 0.1 10*3/uL (ref 0.0–0.7)
Lymphocytes Relative: 24 % (ref 12–46)
Lymphs Abs: 2 10*3/uL (ref 0.7–4.0)
MONO ABS: 0.6 10*3/uL (ref 0.1–1.0)
Monocytes Relative: 8 % (ref 3–12)
Neutro Abs: 5.4 10*3/uL (ref 1.7–7.7)
Neutrophils Relative %: 67 % (ref 43–77)

## 2013-09-16 LAB — LIPID PANEL
Cholesterol: 122 mg/dL (ref 0–200)
HDL: 47 mg/dL
LDL Cholesterol: 24 mg/dL (ref 0–99)
Total CHOL/HDL Ratio: 2.6 ratio
Triglycerides: 254 mg/dL — ABNORMAL HIGH
VLDL: 51 mg/dL — ABNORMAL HIGH (ref 0–40)

## 2013-09-16 LAB — POCT I-STAT, CHEM 8
BUN: 10 mg/dL (ref 6–23)
Calcium, Ion: 1.33 mmol/L — ABNORMAL HIGH (ref 1.13–1.30)
Chloride: 96 mEq/L (ref 96–112)
Creatinine, Ser: 0.8 mg/dL (ref 0.50–1.35)
GLUCOSE: 292 mg/dL — AB (ref 70–99)
HCT: 46 % (ref 39.0–52.0)
HEMOGLOBIN: 15.6 g/dL (ref 13.0–17.0)
Potassium: 4.2 mEq/L (ref 3.7–5.3)
Sodium: 137 mEq/L (ref 137–147)
TCO2: 28 mmol/L (ref 0–100)

## 2013-09-16 LAB — PROTIME-INR
INR: 1.01 (ref 0.00–1.49)
PROTHROMBIN TIME: 13.1 s (ref 11.6–15.2)

## 2013-09-16 LAB — GLUCOSE, CAPILLARY
Glucose-Capillary: 257 mg/dL — ABNORMAL HIGH (ref 70–99)
Glucose-Capillary: 114 mg/dL — ABNORMAL HIGH (ref 70–99)
Glucose-Capillary: 169 mg/dL — ABNORMAL HIGH (ref 70–99)

## 2013-09-16 LAB — TROPONIN I: Troponin I: <0.3 ng/mL (ref <0.30)

## 2013-09-16 LAB — APTT: APTT: 30 s (ref 24–37)

## 2013-09-16 LAB — ETHANOL: Alcohol, Ethyl (B): <11 mg/dL (ref 0–11)

## 2013-09-16 MED ORDER — FLUTICASONE PROPIONATE 50 MCG/ACT NA SUSP
2.0000 | Freq: Two times a day (BID) | NASAL | Status: DC
  Administered 2013-09-16 – 2013-09-17 (×2): 2 via NASAL
  Filled 2013-09-16: qty 16

## 2013-09-16 MED ORDER — IBUPROFEN 600 MG PO TABS: 600.0000 mg | ORAL_TABLET | Freq: Three times a day (TID) | ORAL | Status: DC | PRN

## 2013-09-16 MED ORDER — SENNOSIDES-DOCUSATE SODIUM 8.6-50 MG PO TABS: 1.0000 | ORAL_TABLET | Freq: Every evening | ORAL | Status: DC | PRN

## 2013-09-16 MED ORDER — SIMVASTATIN 10 MG PO TABS
10.0000 mg | ORAL_TABLET | Freq: Every day | ORAL | Status: DC
  Administered 2013-09-16 – 2013-09-17 (×2): 10 mg via ORAL
  Filled 2013-09-16 (×2): qty 1

## 2013-09-16 MED ORDER — HYDROCODONE-ACETAMINOPHEN 5-325 MG PO TABS: 1.0000 | ORAL_TABLET | Freq: Four times a day (QID) | ORAL | Status: DC | PRN

## 2013-09-16 MED ORDER — INSULIN ASPART 100 UNIT/ML ~~LOC~~ SOLN
0.0000 [IU] | Freq: Three times a day (TID) | SUBCUTANEOUS | Status: DC
  Administered 2013-09-17: 2 [IU] via SUBCUTANEOUS
  Administered 2013-09-17 (×2): 3 [IU] via SUBCUTANEOUS

## 2013-09-16 MED ORDER — ENOXAPARIN SODIUM 40 MG/0.4ML ~~LOC~~ SOLN
40.0000 mg | SUBCUTANEOUS | Status: DC
  Administered 2013-09-16 – 2013-09-17 (×2): 40 mg via SUBCUTANEOUS
  Filled 2013-09-16 (×2): qty 0.4

## 2013-09-16 MED ORDER — ASPIRIN 325 MG PO TABS
325.0000 mg | ORAL_TABLET | Freq: Every day | ORAL | Status: DC
  Administered 2013-09-17: 325 mg via ORAL
  Filled 2013-09-16 (×2): qty 1

## 2013-09-16 MED ORDER — LORAZEPAM 0.5 MG PO TABS: 0.5000 mg | ORAL_TABLET | Freq: Four times a day (QID) | ORAL | Status: DC | PRN

## 2013-09-16 MED ORDER — CLOPIDOGREL BISULFATE 75 MG PO TABS
75.0000 mg | ORAL_TABLET | Freq: Every day | ORAL | Status: DC
  Administered 2013-09-17: 75 mg via ORAL
  Filled 2013-09-16: qty 1

## 2013-09-16 MED ORDER — AMITRIPTYLINE HCL 25 MG PO TABS
25.0000 mg | ORAL_TABLET | Freq: Every day | ORAL | Status: DC
  Administered 2013-09-16: 25 mg via ORAL
  Filled 2013-09-16: qty 1

## 2013-09-16 NOTE — Progress Notes (Signed)
Patient arrived to floor. Alert and oriented. Hard of hearing. Garbled speech. Vital signs stable.

## 2013-09-16 NOTE — Progress Notes (Signed)
Notified by RN that pt complained of LEFT arm numbness upon his return from MRI.  Pt seen and examined: Reports left arm numbness and left face numbness that started while getting MRI. Reports no other new symptoms since admission.  VSS. Alert oriented  Exam: Right facial droop but looks improved from 2 hours ago. Speech is somewhat clearer than 2 hours ago.  Bilateral grip remains 5/5.  LE strength 5/5.  Speech somewhat clearer.  Not keeping right eye closed as much.    Has had 325mg  asa while in ED.  Will request Tele Neuro consult.

## 2013-09-16 NOTE — ED Notes (Signed)
Pt c/o slurred speech/dizziness/double vision/right hand numbness since Monday. States had a stroke x 6 weeks ago and returned home last week from rehab.

## 2013-09-16 NOTE — Progress Notes (Signed)
Stroke information given at this time.

## 2013-09-16 NOTE — H&P (Addendum)
Patient seen and examined. Above note reviewed.  He presents with new onset diplopia, slurred speech. He has chronic left-sided weakness. He also described a transient left arm as well as left face numbness. This began after he arrived to the medical floor and has since started to improve. The patient is already on aspirin and Plavix. MRI of the brain does show small area of acute infarct in the right paramedian posterior midbrain. Neurology consult has been requested. At this time, patient appears to have 5 out of 5 strength in his upper and lower extremities bilaterally. He does appear to have right medial rectus weakness.  We'll continue with current antiplatelets. Agree with plan as above.  Keith [name]

## 2013-09-16 NOTE — Progress Notes (Signed)
Neuro tele consult done with Dr. Norval GableZechowy. No new medications recommended. Session discussed with Dr. Kerry HoughMemon.

## 2013-09-16 NOTE — H&P (Signed)
[facility] History and Physical  Lexie Morini ZOX:[id number] DOB: 1945/11/19 DOA: 09/16/2013  Referring physician: Verlene Mayer PCP: Josue Hector, MD   Chief Complaint: diplopia/unsteady gait/right sided numbness  HPI: Keith [name] is a very pleasant 68 y.o. male with past medical history that include diabetes, hypertension, 2 small acute brainstem infarct 2 months ago since emergency Department chief complaint of diplopia numbness and tingling of the right arm and leg and unsteady gait. Information is obtained from the patient. In the case that 3 days ago he awakened and had "double vision. He reports that he sees 2 of everything "side-by-side". He says that the last time he had a stroke he had double vision but everything was on top of each other. In addition he developed right arm and leg numbness and tingling. He went to stand up and developed loss of balance and found it very difficult to walk. He denies any falls. Associated symptoms include mild headache and dizziness. He also has some slurred speech and facial droop but he indicates that this has not changed dense stroke 2 months ago. He was discharged on December 22 after rehabilitation and at that time he was able to ambulate without a cane or walker. He denies any chest pain palpitations shortness of breath cough. He denies any fever chills dysuria hematuria frequency or urgency. He denies any abdominal pain nausea vomiting. His last bowel movement was today was normal in color and consistency. Workup in the emergency room yields a serum glucose of 292 otherwise lab work is unremarkable. CT of the head yields chronic and involutional changes without evidence of acute abnormalities. KG yields normal sinus rhythm with septal infarct that is new since EKG in November of 2014. We are asked to admit.   Review of Systems:  And point review of systems completed and all systems are negative except as indicated in history of present illness    Past Medical History  Diagnosis Date  . Diabetes mellitus without complication   . Hypertension   . Stroke    Past Surgical History  Procedure Laterality Date  . Prostate surgery    . Knee surgery     Social History:  reports that he has quit smoking. He does not have any smokeless tobacco history on file. He reports that he drinks alcohol. He reports that he does not use illicit drugs.  No Known Allergies he lives alone is currently on disability employed as a logger.  History reviewed. No pertinent family history. family medical history reviewed and is noncontributory to the admission of this gentleman  Prior to Admission medications   Medication Sig Start Date End Date Taking? Authorizing Provider  amitriptyline (ELAVIL) 25 MG tablet Take 25 mg by mouth at bedtime.   Yes Historical Provider, MD  amLODipine (NORVASC) 5 MG tablet Take 5 mg by mouth daily.   Yes Historical Provider, MD  aspirin 325 MG tablet Take 1 tablet (325 mg total) by mouth daily. 08/05/13  Yes Beverely Low, MD  cholecalciferol (VITAMIN D) 1000 UNITS tablet Take 1,000 Units by mouth daily.   Yes Historical Provider, MD  clopidogrel (PLAVIX) 75 MG tablet Take 75 mg by mouth daily with breakfast.   Yes Historical Provider, MD  fluticasone (FLONASE) 50 MCG/ACT nasal spray Place 2 sprays into both nostrils 2 (two) times daily.   Yes Historical Provider, MD  Garlic 100 MG TABS Take 1 tablet by mouth daily.   Yes Historical Provider, MD  glipiZIDE (GLUCOTROL) 10 MG tablet Take 10  mg by mouth 2 (two) times daily before a meal.   Yes Historical Provider, MD  HYDROcodone-acetaminophen (NORCO/VICODIN) 5-325 MG per tablet Take 1 tablet by mouth every 6 (six) hours as needed for moderate pain.   Yes Historical Provider, MD  ibuprofen (ADVIL,MOTRIN) 600 MG tablet Take 600 mg by mouth every 8 (eight) hours as needed for moderate pain.   Yes Historical Provider, MD  LORazepam (ATIVAN) 0.5 MG tablet Take 1 tablet (0.5 mg total)  by mouth every 6 (six) hours as needed for anxiety. 08/05/13  Yes Beverely LowElena Adamo, MD  losartan (COZAAR) 100 MG tablet Take 100 mg by mouth daily.   Yes Historical Provider, MD  metFORMIN (GLUCOPHAGE) 500 MG tablet Take 1,000 mg by mouth 2 (two) times daily with a meal.    Yes Historical Provider, MD  Multiple Vitamin (MULTIVITAMIN WITH MINERALS) TABS tablet Take 1 tablet by mouth daily.   Yes Historical Provider, MD  Omega-3 Fatty Acids (FISH OIL) 1000 MG CAPS Take 1 capsule by mouth 2 (two) times daily.   Yes Historical Provider, MD  simvastatin (ZOCOR) 10 MG tablet Take 10 mg by mouth daily at 6 PM.   Yes Historical Provider, MD  thiamine 100 MG tablet Take 1 tablet (100 mg total) by mouth daily. 08/05/13  Yes Beverely LowElena Adamo, MD   Physical Exam: Filed Vitals:   09/16/13 1414  BP: 114/69  Pulse: 75  Temp: 98.6 F (37 C)  Resp: 16    BP 114/69  Pulse 75  Temp(Src) 98.6 F (37 C) (Oral)  Resp 16  Ht 6\' 1"  (1.854 m)  Wt 77.111 kg (170 lb)  BMI 22.43 kg/m2  SpO2 99%  General:  Appears calm and comfortable. Somewhat thin Eyes: PERRL,  irises & conjunctiva. Keeping right eye closed ENT: Ears are clear nose without drainage oropharynx without erythema or exudate. He does membranes of his mouth are pink slightly dry Neck: no LAD, masses or thyromegaly Cardiovascular: RRR, no m/r/g. No LE edema. Respiratory: CTA bilaterally, no w/r/r. Normal respiratory effort. Abdomen: Flat soft positive bowel sounds nontender to palpation no mass organomegaly no Skin: no rash or induration seen on limited exam Musculoskeletal: grossly normal tone BUE/BLE Psychiatric: grossly normal mood and affect Neurologic: Right facial droop. Keeping his right eye closed. Some tongue deviation. No pronator drift. Speech is slow deliberate and somewhat slurred. Bilateral grip 5/5.            Labs on Admission:  Basic Metabolic Panel:  Recent Labs Lab 09/16/13 1049 09/16/13 1108  NA 139 137  K 4.4 4.2  CL 96 96   CO2 27  --   GLUCOSE 287* 292*  BUN 11 10  CREATININE 0.75 0.80  CALCIUM 10.1  --    Liver Function Tests:  Recent Labs Lab 09/16/13 1049  AST 17  ALT 20  ALKPHOS 66  BILITOT 0.7  PROT 7.7  ALBUMIN 4.1   No results found for this basename: LIPASE, AMYLASE,  in the last 168 hours No results found for this basename: AMMONIA,  in the last 168 hours CBC:  Recent Labs Lab 09/16/13 1049 09/16/13 1108  WBC 8.1  --   NEUTROABS 5.4  --   HGB 15.1 15.6  HCT 42.8 46.0  MCV 96.8  --   PLT 229  --    Cardiac Enzymes:  Recent Labs Lab 09/16/13 1049  TROPONINI <0.30    BNP (last 3 results) No results found for this basename: PROBNP,  in the  last 8760 hours CBG:  Recent Labs Lab 09/16/13 1033  GLUCAP 257*    Radiological Exams on Admission: Ct Head Wo Contrast  09/16/2013   CLINICAL DATA:  Dizziness, slurred speech, numbness  EXAM: CT HEAD WITHOUT CONTRAST  TECHNIQUE: Contiguous axial images were obtained from the base of the skull through the vertex without intravenous contrast.  COMPARISON:  07/31/2013  FINDINGS: The diffuse areas of low attenuation project within the subcortical, deep, and periventricular white matter regions. There is no evidence of intra-axial or extra-axial fluid collections or evidence of acute hemorrhage. There is no evidence of mass effect. The ventricles and cisterns are patent. The pons, the basal ganglia regions, and cerebellum are unremarkable. There is no evidence of a depressed skull fracture. Visualized paranasal sinuses and mastoid air cells are patent.  IMPRESSION: Chronic and involutional changes without evidence of focal acute abnormalities.   Electronically Signed   By: Salome Holmes M.D.   On: 09/16/2013 12:06    EKG: Independently reviewed NSR no acute changes  Assessment/Plan Principal Problem:   Stroke: will admit to observation. Recurrent. And had carotid Dopplers and 2-D echo 2 months ago. X-ray yields an EF of 55% and grade 1  diastolic dysfunction. Carotid Dopplers yields bilateral: mild intimal wall thickening CCA. Mild mixed plaque origin ICA. 1-39% ICA stenosis. Verebral artery flow antegrade. Will repeat MRI MRA. Patient already on Plavix and aspirin we'll continue. Home medications include Cozaar Zocor Norvasc. Will hold antihypertensives for now to allow for permissive hypertension. Will continue Zocor. Will get bedside swallow eval and once he passes this will start a car modified diet. Will request PT evaluation. Will check lipid panel and hemoglobin A1c to Will request neurology consult  Active Problems:  Essential hypertension, benign: Systolic blood pressure range 161-096. Will hold his antihypertensives for now to allow for permissive hypertension. Will monitor and reintroduce when indicated   Type II or unspecified type diabetes mellitus without mention of complication, not stated as uncontrolled. Hemoglobin A1c 7.4 in November of 2014. Manages his diabetes with glipizide and metformin. Will hold these for now. Provide car modified diet once he has passed a swallow eval. Will use sliding scale insulin for optimal glycemic control    Dysarthria due to cerebrovascular accident: Patient reports that he is currently at his baseline. Will obtain swallow evaluation and then provide recommended diet.      Depression with anxiety: Appears stable at baseline. Home medications include Elavil and Ativan. Continue these for now.    Code Status: full Family Communication: none Disposition Plan: Home when ready  Time spent: 65 minutes  [facility] Pager [phone]

## 2013-09-16 NOTE — Progress Notes (Signed)
Note reviewed.  Please refer to History and physical for further plans.  MEMON,JEHANZEB

## 2013-09-16 NOTE — ED Notes (Signed)
Pt up to ambulate. With 2 person assist.  Pt has unsteady gait.

## 2013-09-16 NOTE — ED Provider Notes (Signed)
CSN: [id number]     Arrival date & time 09/16/13  1018 History  This chart was scribed for Joya Gaskinsonald W Clotiel Troop, MD by Quintella ReichertMatthew Underwood, ED scribe.  This patient was seen in room APA14/APA14 and the patient's care was started at 10:30 AM.   Chief Complaint  Patient presents with  . Numbness    Patient is a 68 y.o. male presenting with weakness. The history is provided by the patient and medical records. No language interpreter was used.  Weakness This is a recurrent problem. The current episode started more than 2 days ago. The problem occurs constantly. Associated symptoms include headaches. Pertinent negatives include no chest pain, no abdominal pain and no shortness of breath. Associated symptoms comments: Right hand numbness, diplopia, dizziness, slurred speech, headache. He has tried nothing for the symptoms.    HPI Comments: Keith Logesaul [name] is a 68 y.o. male with h/o recent stroke, DM and HTN who presents to the Emergency Department complaining of right hand numbness that began 3 days ago with associated diplopia, dizziness, slurred speech, headache, and worsening loss of balance.  Pt states he initially noticed sudden-onset double vision.  He then attempted to stand up and developed loss of balance and found he was unable to stand up or walk.  He also complains of numbness to the right hand and a mild headache.  Pt reports he had similar weakness after his stroke several weeks ago but it had greatly improved prior to his most recent symptoms.  He denies fevers, syncope, CP, SOB, abdominal pain, vomiting, or diarrhea.  Per previous medical records, pt was admitted on 07/31/13 with left facial droop and dysarthric speech x1 week. MRI, MRA of the brain revealed two small acute brainstem infarcts in the right paracentral pons and central-to-left paracentral medulla.  He was discharged on 12/22 after rehabilitation and at that time was able to ambulate.  He denies recent injuries.    Past Medical History   Diagnosis Date  . Diabetes mellitus without complication   . Hypertension   . Stroke     Past Surgical History  Procedure Laterality Date  . Prostate surgery    . Knee surgery      History reviewed. No pertinent family history.   History  Substance Use Topics  . Smoking status: Former Games developermoker  . Smokeless tobacco: Not on file  . Alcohol Use: Yes     Comment: occ     Review of Systems  Constitutional: Negative for fever.  Eyes: Positive for visual disturbance (diplopia).  Respiratory: Negative for shortness of breath.   Cardiovascular: Negative for chest pain.  Gastrointestinal: Negative for vomiting, abdominal pain and diarrhea.  Neurological: Positive for dizziness, speech difficulty, weakness, numbness and headaches. Negative for syncope.  All other systems reviewed and are negative.     Allergies  Review of patient's allergies indicates no known allergies.  Home Medications   Current Outpatient Rx  Name  Route  Sig  Dispense  Refill  . acetaminophen (TYLENOL) 500 MG tablet   Oral   Take 1,000 mg by mouth every 6 (six) hours as needed.         Marland Kitchen. amLODipine (NORVASC) 5 MG tablet   Oral   Take 5 mg by mouth daily.         Marland Kitchen. aspirin 325 MG tablet   Oral   Take 1 tablet (325 mg total) by mouth daily.   30 tablet   0   . atorvastatin (LIPITOR) 80 MG  tablet   Per NG tube   1 tablet (80 mg total) by Per NG tube route daily at 6 PM.   30 tablet   0   . clopidogrel (PLAVIX) 75 MG tablet   Per NG tube   1 tablet (75 mg total) by Per NG tube route daily with breakfast.   30 tablet   0   . fluticasone (FLONASE) 50 MCG/ACT nasal spray   Each Nare   Place 2 sprays into both nostrils 2 (two) times daily.         . folic acid (FOLVITE) 1 MG tablet   Per Tube   Place 1 tablet (1 mg total) into feeding tube daily.   30 tablet   0   . HYDROcodone-acetaminophen (NORCO/VICODIN) 5-325 MG per tablet   Oral   Take 1 tablet by mouth every 6 (six) hours  as needed for moderate pain.         Marland Kitchen ibuprofen (ADVIL,MOTRIN) 600 MG tablet   Oral   Take 600 mg by mouth every 8 (eight) hours as needed for moderate pain.         Marland Kitchen LORazepam (ATIVAN) 0.5 MG tablet   Oral   Take 1 tablet (0.5 mg total) by mouth every 6 (six) hours as needed for anxiety.   30 tablet   0   . losartan (COZAAR) 100 MG tablet   Oral   Take 100 mg by mouth daily.         . metFORMIN (GLUCOPHAGE) 500 MG tablet   Oral   Take 500 mg by mouth 2 (two) times daily with a meal.         . Multiple Vitamin (MULTIVITAMIN WITH MINERALS) TABS tablet   Per Tube   Place 1 tablet into feeding tube daily.   30 tablet   0   . Nutritional Supplements (FEEDING SUPPLEMENT, GLUCERNA 1.2 CAL,) LIQD   Per Tube   Place 1,000 mLs into feeding tube continuous.   30000 mL   0   . sertraline (ZOLOFT) 50 MG tablet   Oral   Take 1 tablet (50 mg total) by mouth daily.   30 tablet   0   . thiamine 100 MG tablet   Oral   Take 1 tablet (100 mg total) by mouth daily.   30 tablet   0     BP 127/71  Pulse 95  Temp(Src) 98.4 F (36.9 C) (Oral)  Resp 16  Ht 6\' 1"  (1.854 m)  Wt 170 lb (77.111 kg)  BMI 22.43 kg/m2  SpO2 97%   Physical Exam CONSTITUTIONAL: Well developed/well nourished HEAD: Normocephalic/atraumatic EYES: EOMI/PERRL ENMT: Mucous membranes moist NECK: supple no meningeal signs SPINE:entire spine nontender CV: S1/S2 noted, no murmurs/rubs/gallops noted LUNGS: Lungs are clear to auscultation bilaterally, no apparent distress ABDOMEN: soft, nontender, no rebound or guarding GU:no cva tenderness NEURO: Pt is awake/alert, moves all extremitiesx4, dysarthria noted, limitation in ROM of right eye (difficulty with medial movement), past-pointing noted EXTREMITIES: pulses normal, full ROM SKIN: warm, color normal PSYCH: no abnormalities of mood noted   ED Course  Procedures (including critical care time)  DIAGNOSTIC STUDIES: Oxygen Saturation is 97%  on room air, normal by my interpretation.    COORDINATION OF CARE: 10:44 AM-Informed pt of likelihood of recurrent stroke.  Discussed treatment plan which includes head CT, EKG, and labs with pt at bedside and pt agreed to plan.    tPA in stroke considered but not given due to:  Onset over 3-4.5hours  12:58 PM Will admit for likely recurrent stroke Pt is unable to walk which is new for him D/w dr Kerry Hough, will admit   Labs Review Labs Reviewed  GLUCOSE, CAPILLARY - Abnormal; Notable for the following:    Glucose-Capillary 257 (*)    All other components within normal limits  CBC - Abnormal; Notable for the following:    MCH 34.2 (*)    All other components within normal limits  COMPREHENSIVE METABOLIC PANEL - Abnormal; Notable for the following:    Glucose, Bld 287 (*)    All other components within normal limits  URINALYSIS, ROUTINE W REFLEX MICROSCOPIC - Abnormal; Notable for the following:    Glucose, UA 500 (*)    All other components within normal limits  POCT I-STAT, CHEM 8 - Abnormal; Notable for the following:    Glucose, Bld 292 (*)    Calcium, Ion 1.33 (*)    All other components within normal limits  ETHANOL  PROTIME-INR  APTT  DIFFERENTIAL  TROPONIN I  URINE RAPID DRUG SCREEN (HOSP PERFORMED)    Imaging Review Ct Head Wo Contrast  09/16/2013   CLINICAL DATA:  Dizziness, slurred speech, numbness  EXAM: CT HEAD WITHOUT CONTRAST  TECHNIQUE: Contiguous axial images were obtained from the base of the skull through the vertex without intravenous contrast.  COMPARISON:  07/31/2013  FINDINGS: The diffuse areas of low attenuation project within the subcortical, deep, and periventricular white matter regions. There is no evidence of intra-axial or extra-axial fluid collections or evidence of acute hemorrhage. There is no evidence of mass effect. The ventricles and cisterns are patent. The pons, the basal ganglia regions, and cerebellum are unremarkable. There is no  evidence of a depressed skull fracture. Visualized paranasal sinuses and mastoid air cells are patent.  IMPRESSION: Chronic and involutional changes without evidence of focal acute abnormalities.   Electronically Signed   By: Salome Holmes M.D.   On: 09/16/2013 12:06    EKG Interpretation    Date/Time:  Thursday September 16 2013 10:37:52 EST Ventricular Rate:  90 PR Interval:  166 QRS Duration: 86 QT Interval:  374 QTC Calculation: 457 R Axis:   -4 Text Interpretation:  Normal sinus rhythm Possible Left atrial enlargement Septal infarct , age undetermined Abnormal ECG When compared with ECG of 31-Jul-2013 13:30, Septal infarct is now Present Confirmed by Bebe Shaggy  MD, Sir Mallis [phone]) on 09/16/2013 11:20:35 AM            MDM  No diagnosis found. Nursing notes including past medical history and social history reviewed and considered in documentation Labs/vital reviewed and considered Previous records reviewed and considered     I personally performed the services described in this documentation, which was scribed in my presence. The recorded information has been reviewed and is accurate.      Joya Gaskins, MD 09/16/13 1259

## 2013-09-16 NOTE — Progress Notes (Addendum)
Patient complains of new onset of numbness to left arm and left side of face. Dr. Kerry HoughMemon and Toya SmothersKaren Black FNP notified. Neuro physician paged. Vital signs stable. Patient passed swallow eval

## 2013-09-17 DIAGNOSIS — I639 Cerebral infarction, unspecified: Secondary | ICD-10-CM | POA: Diagnosis present

## 2013-09-17 LAB — GLUCOSE, CAPILLARY
GLUCOSE-CAPILLARY: 223 mg/dL — AB (ref 70–99)
Glucose-Capillary: 173 mg/dL — ABNORMAL HIGH (ref 70–99)
Glucose-Capillary: 232 mg/dL — ABNORMAL HIGH (ref 70–99)

## 2013-09-17 LAB — HEMOGLOBIN A1C
Hgb A1c MFr Bld: 7.8 % — ABNORMAL HIGH (ref <5.7)
MEAN PLASMA GLUCOSE: 177 mg/dL — AB (ref <117)

## 2013-09-17 MED ORDER — CLOPIDOGREL BISULFATE 75 MG PO TABS: 75.0000 mg | ORAL_TABLET | Freq: Every day | ORAL | Status: AC

## 2013-09-17 MED ORDER — STROKE: EARLY STAGES OF RECOVERY BOOK
Freq: Once | Status: AC
  Administered 2013-09-17: 17:00:00
  Filled 2013-09-17: qty 1

## 2013-09-17 NOTE — Progress Notes (Signed)
Patient has HH order for discharge (PT and RN).  Patient educated on various agencies.  Patient verbalized that he agreed to [facility].  Order, face sheet, discharge summary and face to face faxed to [facility].  RN phoned agency and made them aware of referral.  Stroke booklet provided to patient.  Patient verbalized understanding of stroke education. Patient educated that Dr. Kerry HoughMemon wants patient to wears eye patch to left eye until he sees his rehab physician for follow-up at the end of January as scheduled.  Patient verbalized understanding. Patient's IV removed.   Site bled for several minutes but pressure applied and bleeding stopped.  Site WNL.  AVS reviewed with patient.  Patient verbalized understanding of discharge instructions, physician follow-up and medications.  Patient's son to be staying with patient.  Patient transported by NT via w/c to main entrance for discharge.  Patient stable at time of discharge.

## 2013-09-17 NOTE — Care Management Note (Unsigned)
    Page 1 of 1   09/17/2013     11:16:28 AM   CARE MANAGEMENT NOTE 09/17/2013  Patient:  [name],Neziah   Account Number:  [id number]  Date Initiated:  09/17/2013  Documentation initiated by:  Rosemary HolmsOBSON,Ruhan Borak  Subjective/Objective Assessment:   Pt lives at home alone. States son lives across the street. Pt had CVA with diplopia. Per PT and OT, quit a fall risk and would benefit from CIR. Spoke with pt who tends to believe that this diplopia will soon resolve and he will be able     Action/Plan:   return home. Pt has been to CIR in the past with good results and good memories of outcome. Agreeable for CM to contact CIR for bed availabliity and elligibility. CM stressed to pt that he would need 24/7 asssistance at this point. see belo   Anticipated DC Date:     Anticipated DC Plan:        DC Planning Services  CM consult      Choice offered to / List presented to:             Status of service:   Medicare Important Message given?   (If response is "NO", the following Medicare IM given date fields will be blank) Date Medicare IM given:   Date Additional Medicare IM given:    Discharge Disposition:    Per UR Regulation:    If discussed at Long Length of Stay Meetings, dates discussed:    Comments:  09/17/13 Rosemary HolmsAmy Zyann Mabry RN BNS CM States that he can return home and his son can stay with him over the weekend. Again pt is under the impression that diplopia will clear in a few days. CIR called and asked to evaluate pt.

## 2013-09-17 NOTE — Clinical Documentation Improvement (Signed)
  Patient with Acute CVA. Presented with right side numbness and weakness causing difficulty walking, now improved. Would "transient right hemiparesis" be an appropriate secondary diagnosis? If so please add to documentation for severity of illness and risk of mortality. Thank you.   Thank You, Beverley FiedlerLaurie E Kahlil Cowans ,RN Clinical Documentation Specialist:  [phone]  [facility] Information Management

## 2013-09-17 NOTE — Clinical Social Work Psychosocial (Signed)
Clinical Social Work Department BRIEF PSYCHOSOCIAL ASSESSMENT 09/17/2013  Patient:  Keith [name], Keith [name]     Account Number:  [id number]     Admit date:  09/16/2013  Clinical Social Worker:  Wyatt Haste  Date/Time:  09/17/2013 10:36 AM  Referred by:  Physician  Date Referred:  09/17/2013 Referred for  SNF Placement   Other Referral:   Interview type:  Patient Other interview type:    PSYCHOSOCIAL DATA Living Status:  ALONE Admitted from facility:   Level of care:   Primary support name:  [place] Primary support relationship to patient:  CHILD, ADULT Degree of support available:   supportive    CURRENT CONCERNS Current Concerns  Post-Acute Placement   Other Concerns:    SOCIAL WORK ASSESSMENT / PLAN CSW met with pt at bedside. Pt alert and oriented and had just completed therapy evaluation. He reports he lives alone and is independent in care. His son, [place] is a good support for him and lives across the street. Pt reports he had a stroke in November and is currently in hospital with an extension of stroke. CSW discussed d/c plan with pt. He indicates he may be open to CIR, but may also want to go home, particularly if he has a lot of out of pocket costs. Pt reports he was at [facility] in the past and transferred to SNF and he had a lot of charges. CSW asked about back up plan and he states he will return home and either stay with his son or someone else. Discussed SNF and pt is aware that without a qualifying 3 day stay Medicare will not cover SNF. He states he cannot afford private pay.   Assessment/plan status:  Referral to [employer] Other assessment/ plan:   Information/referral to community resources:   SNF list  CM for CIR    PATIENT'S/FAMILY'S RESPONSE TO PLAN OF CARE: Pt appears to prefer to return home with support from family, but is open to consider CIR. CSW notified CM. Will sign off as he is refusing SNF at this time.       Benay Pike, [place]

## 2013-09-17 NOTE — Progress Notes (Signed)
Please see discharge summary from later today.  [name],Keith

## 2013-09-17 NOTE — Progress Notes (Signed)
UR completed. Patient changed to inpatient r/t +CVA on MRI 

## 2013-09-17 NOTE — Discharge Instructions (Signed)
Ischemic Stroke A stroke (cerebrovascular accident) is the sudden death of brain tissue. It is a medical emergency. A stroke can cause permanent loss of brain function. This can cause problems with different parts of your body. A transient ischemic attack (TIA) is different because it does not cause permanent damage. A TIA is a short-lived problem of poor blood flow affecting a part of the brain. A TIA is also a serious problem because having a TIA greatly increases the chances of having a stroke. When symptoms first develop, you cannot know if the problem might be a stroke or TIA. CAUSES  A stroke is caused by a decrease of oxygen supply to an area of your brain. It is usually the result of a small blood clot or collection of cholesterol or fat (plaque) that blocks blood flow in the brain. A stroke can also be caused by blocked or damaged carotid arteries.  RISK FACTORS  High blood pressure (hypertension).  High cholesterol.  Diabetes mellitus.  Heart disease.  The build up of plaque in the blood vessels (peripheral artery disease or atherosclerosis).  The build up of plaque in the blood vessels providing blood and oxygen to the brain (carotid artery stenosis).  An abnormal heart rhythm (atrial fibrillation).  Obesity.  Smoking.  Taking oral contraceptives (especially in combination with smoking).  Physical inactivity.  A diet high in fats, salt (sodium), and calories.  Alcohol use.  Use of illegal drugs (especially cocaine and methamphetamine).  Being African American.  Being over the age of 55.  Family history of stroke.  Previous history of blood clots, stroke, TIA, or heart attack.  Sickle cell disease. SYMPTOMS  These symptoms usually develop suddenly, or may be newly present upon awakening from sleep:  Sudden weakness or numbness of the face, arm, or leg, especially on one side of the body.  Sudden trouble walking or difficulty moving arms or legs.  Sudden  confusion.  Sudden personality changes.  Trouble speaking (aphasia) or understanding.  Difficulty swallowing.  Sudden trouble seeing in one or both eyes.  Double vision.  Dizziness.  Loss of balance or coordination.  Sudden severe headache with no known cause.  Trouble reading or writing. DIAGNOSIS  Your caregiver can often determine the presence or absence of a stroke based on your symptoms, history, and physical exam. Computed tomography (CT) of the brain is usually performed to confirm the stroke, determine causes, and determine stroke severity. Other tests may be done to find the cause of the stroke. These tests may include:  Electrocardiography.  Continuous heart monitoring.  Echocardiography.  Carotid ultrasonography.  Magnetic resonance imaging (MRI).  A scan of the brain circulation.  Blood tests. PREVENTION  The risk of a stroke can be decreased by appropriately treating high blood pressure, high cholesterol, diabetes, heart disease, and obesity and by quitting smoking, limiting alcohol, and staying physically active. TREATMENT  Time is of the essence. It is important to seek treatment within 3 4 hours of the start of symptoms because you may receive a medicine to dissolve the clot (thrombolytic) that cannot be given after that time. Even if you do not know when your symptoms began, get treatment as soon as possible. After the 4 hour window has passed, treatment may include rest, oxygen, intravenous (IV) fluids, and medicines to thin the blood (anticoagulants). Treatment of stroke depends on the duration, severity, and cause of your symptoms. Medicines and diet may be used to address diabetes, high blood pressure, and other risk   factors. Physical, speech, and occupational therapists will assess you and work to improve any functions impaired by the stroke. Measures will be taken to prevent short-term and long-term complications, including infection from breathing  foreign material into the lungs (aspiration pneumonia), blood clots in the legs, bedsores, and falls. Rarely, surgery may be needed to remove large blood clots or to open up blocked arteries. HOME CARE INSTRUCTIONS   Take all medicines prescribed by your caregiver. Follow the directions carefully. Medicines may be used to control risk factors for a stroke. Be sure you understand all your medicine instructions.  You may be told to take aspirin or the anticoagulant warfarin. Warfarin needs to be taken exactly as instructed.  Too much and too little warfarin are both dangerous. Too much warfarin increases the risk of bleeding. Too little warfarin continues to allow the risk for blood clots. While taking warfarin, you will need to have regular blood tests to measure your blood clotting time. These blood tests usually include both the PT and INR tests. The PT and INR results allow your caregiver to adjust your dose of warfarin. The dose can change for many reasons. It is critically important that you take warfarin exactly as prescribed, and that you have your PT and INR levels drawn exactly as directed.  Many foods, especially foods high in vitamin K can interfere with warfarin and affect the PT and INR results. Foods high in vitamin K include spinach, kale, broccoli, cabbage, collard and turnip greens, brussels sprouts, peas, cauliflower, seaweed, and parsley as well as beef and pork liver, green tea, and soybean oil. You should eat a consistent amount of foods high in vitamin K. Avoid major changes in your diet, or notify your caregiver before changing your diet. Arrange a visit with a dietitian to answer your questions.  Many medicines can interfere with warfarin and affect the PT and INR results. You must tell your caregiver about any and all medicines you take, this includes all vitamins and supplements. Be especially cautious with aspirin and anti-inflammatory medicines. Do not take or discontinue any  prescribed or over-the-counter medicine except on the advice of your caregiver or pharmacist.  Warfarin can have side effects, such as excessive bruising or bleeding. You will need to hold pressure over cuts for longer than usual. Your caregiver or pharmacist will discuss other potential side effects.  Avoid sports or activities that may cause injury or bleeding.  Be mindful when shaving, flossing your teeth, or handling sharp objects.  Alcohol can change the body's ability to handle warfarin. It is best to avoid alcoholic drinks or consume only very small amounts while taking warfarin. Notify your caregiver if you change your alcohol intake.  Notify your dentist or other caregivers before procedures.  If swallow studies have determined that your swallowing reflex is present, you should eat healthy foods. A diet that includes 5 or more servings of fruits and vegetables a day may reduce the risk of stroke. Foods may need to be a special consistency (soft or pureed), or small bites may need to be taken in order to avoid aspirating or choking. Certain diets may be prescribed to address high blood pressure, high cholesterol, diabetes, or obesity.  A low-sodium, low-saturated fat, low-trans fat, low-cholesterol diet is recommended to manage high blood pressure.  A low-saturated fat, low-trans fat, low-cholesterol, and high-fiber diet may control cholesterol levels.  A controlled-carbohydrate, controlled-sugar diet is recommended to manage diabetes.  A reduced-calorie, low-sodium, low-saturated fat, low-trans fat, low-cholesterol diet   is recommended to manage obesity.  Maintain a healthy weight.  Stay physically active. It is recommended that you get at least 30 minutes of activity on most or all days.  Do not smoke.  Limit alcohol use even if you are not taking warfarin. Moderate alcohol use is considered to be:  No more than 2 drinks each day for men.  No more than 1 drink each day for  nonpregnant women.  Stop drug abuse.  Home safety. A safe home environment is important to reduce the risk of falls. Your caregiver may arrange for specialists to evaluate your home. Having grab bars in the bedroom and bathroom is often important. Your caregiver may arrange for equipment to be used at home, such as raised toilets and a seat for the shower.  Physical, occupational, and speech therapy. Ongoing therapy may be needed to maximize your recovery after a stroke. If you have been advised to use a walker or a cane, use it at all times. Be sure to keep your therapy appointments.  Follow all instructions for follow-up with your caregiver. This is very important. This includes any referrals, physical therapy, rehabilitation, and lab tests. Proper follow up can prevent another stroke from occurring. SEEK MEDICAL CARE IF:  You have personality changes.  You have difficulty swallowing.  You are seeing double.  You have dizziness.  You have a fever.  You have skin breakdown. SEEK IMMEDIATE MEDICAL CARE IF:  Any of these symptoms may represent a serious problem that is an emergency. Do not wait to see if the symptoms will go away. Get medical help right away. Call your local emergency services (911 in [place] Do not drive yourself to the hospital.  You have sudden weakness or numbness of the face, arm, or leg, especially on one side of the body.  You have sudden trouble walking or difficulty moving arms or legs.  You have sudden confusion.  You have trouble speaking (aphasia) or understanding.  You have sudden trouble seeing in one or both eyes.  You have a loss of balance or coordination.  You have a sudden, severe headache with no known cause.  You have new chest pain or an irregular heartbeat.  You have a partial or total loss of consciousness.   Document Released: 08/19/2005 Document Revised: 04/21/2013 Document Reviewed: 03/29/2012 [employer] Patient Information 2014  [employer]  

## 2013-09-17 NOTE — Progress Notes (Deleted)
Physician Discharge Summary  Keith [name] ZOX:[id number] DOB: May 03, 1946 DOA: 09/16/2013  PCP: Josue Hector, MD  Admit date: 09/16/2013 Discharge date: 09/17/2013  Time spent: 40 minutes  Recommendations for Outpatient Follow-up:  1. Follow up with neurology in 4 weeks 2. Discontinue plavix after 2 weeks  Discharge Diagnoses:  Principal Problem:   CVA (cerebral vascular accident) Active Problems:   Dysarthria due to cerebrovascular accident   Type II or unspecified type diabetes mellitus without mention of complication, not stated as uncontrolled   Essential hypertension, benign   Depression with anxiety   Stroke   Discharge Condition: stable  Diet recommendation: low salt, low carb  Filed Weights   09/16/13 1026 09/16/13 1029  Weight: 77.111 kg (170 lb) 77.111 kg (170 lb)    History of present illness:  Keith [name] is a very pleasant 68 y.o. male with past medical history that include diabetes, hypertension, 2 small acute brainstem infarct 2 months ago since emergency Department chief complaint of diplopia numbness and tingling of the right arm and leg and unsteady gait. Information is obtained from the patient. In the case that 3 days ago he awakened and had "double vision. He reports that he sees 2 of everything "side-by-side". He says that the last time he had a stroke he had double vision but everything was on top of each other. In addition he developed right arm and leg numbness and tingling. He went to stand up and developed loss of balance and found it very difficult to walk. He denies any falls. Associated symptoms include mild headache and dizziness. He also has some slurred speech and facial droop but he indicates that this has not changed dense stroke 2 months ago. He was discharged on December 22 after rehabilitation and at that time he was able to ambulate without a cane or walker. He denies any chest pain palpitations shortness of breath cough. He denies any fever  chills dysuria hematuria frequency or urgency. He denies any abdominal pain nausea vomiting. His last bowel movement was today was normal in color and consistency. Workup in the emergency room yields a serum glucose of 292 otherwise lab work is unremarkable. CT of the head yields chronic and involutional changes without evidence of acute abnormalities. KG yields normal sinus rhythm with septal infarct that is new since EKG in November of 2014. We are asked to admit.    Hospital Course:  Patient was admitted with acute diplopia, numbness and tingling in right upper and lower extremities.  His symptoms began several days before arrival to the hospital. Her underwent MRI of the brain which revealed an acute infarct in the right paramedian posterior midbrain. Patient recently was admitted for stroke in 11/14.  At that time echocardiogram and carotid dopplers were done and therefore were not repeated during this admission.  LDL was found to be <100.  A1C was mildly elevated at 7.8. Patient was taking aspirin and plavix prior to admission. He was seen by neurology and no further work up was recommended. It was recommended that patient continue dual antiplatelet therapy for the next 2 weeks and then change to single agent by discontinuing plavix. He will follow up with neurology in the next 4 weeks.  He was seen by physical therapy and initial recommendations were for CIR.  When CIR was contacted, it was felt that patient was too functional to require intensive therapy in CIR.  Patient does have a right medial rectus weakness which is causing diplopia.  An eye patch  has been applied to his left eye to aide with this. He has been set up with home health services.  His son reports that he will be staying with him 24/7.  Patient has been cleared for discharge by neurology.  Procedures:  none  Consultations:  Neurology  Discharge Exam: Filed Vitals:   09/17/13 1500  BP: 121/74  Pulse: 76  Temp: 97.5 F (36.4  C)  Resp: 20    General: NAD Cardiovascular: S1, S2 RRR Respiratory: CTA B  Discharge Instructions  Discharge Orders   Future Appointments Provider Department Dept Phone   10/01/2013 12:30 PM Erick Colace, [phone]   Future Orders Complete By Expires   Diet - low sodium heart healthy  As directed    Diet Carb Modified  As directed    Face-to-face encounter (required for Medicare/Medicaid patients)  As directed    Comments:     I Keith [name] certify that this patient is under my care and that I, or a nurse practitioner or physician's assistant working with me, had a face-to-face encounter that meets the physician face-to-face encounter requirements with this patient on 09/17/2013. The encounter with the patient was in whole, or in part for the following medical condition(s) which is the primary reason for home health care (List medical condition): patient admitted with stroke and would benefit from home health RN and PT   Questions:     The encounter with the patient was in whole, or in part, for the following medical condition, which is the primary reason for home health care:  stroke   I certify that, based on my findings, the following services are medically necessary home health services:  Nursing   Physical therapy   My clinical findings support the need for the above services:  Unsafe ambulation due to balance issues   Further, I certify that my clinical findings support that this patient is homebound due to:  Unsafe ambulation due to balance issues   Reason for Medically Necessary Home Health Services:  Skilled Nursing- Skilled Assessment/Observation   Home Health  As directed    Questions:     To provide the following care/treatments:  PT   RN   Increase activity slowly  As directed        Medication List    STOP taking these medications       ibuprofen 600 MG tablet  Commonly known as:  ADVIL,MOTRIN      TAKE these  medications       amitriptyline 25 MG tablet  Commonly known as:  ELAVIL  Take 25 mg by mouth at bedtime.     amLODipine 5 MG tablet  Commonly known as:  NORVASC  Take 5 mg by mouth daily.     aspirin 325 MG tablet  Take 1 tablet (325 mg total) by mouth daily.     cholecalciferol 1000 UNITS tablet  Commonly known as:  VITAMIN D  Take 1,000 Units by mouth daily.     clopidogrel 75 MG tablet  Commonly known as:  PLAVIX  Take 1 tablet (75 mg total) by mouth daily with breakfast. Discontinue after 14 days     Fish Oil 1000 MG Caps  Take 1 capsule by mouth 2 (two) times daily.     fluticasone 50 MCG/ACT nasal spray  Commonly known as:  FLONASE  Place 2 sprays into both nostrils 2 (two) times daily.     Garlic 100 MG Tabs  Take  1 tablet by mouth daily.     glipiZIDE 10 MG tablet  Commonly known as:  GLUCOTROL  Take 10 mg by mouth 2 (two) times daily before a meal.     HYDROcodone-acetaminophen 5-325 MG per tablet  Commonly known as:  NORCO/VICODIN  Take 1 tablet by mouth every 6 (six) hours as needed for moderate pain.     LORazepam 0.5 MG tablet  Commonly known as:  ATIVAN  Take 1 tablet (0.5 mg total) by mouth every 6 (six) hours as needed for anxiety.     losartan 100 MG tablet  Commonly known as:  COZAAR  Take 100 mg by mouth daily.     metFORMIN 500 MG tablet  Commonly known as:  GLUCOPHAGE  Take 1,000 mg by mouth 2 (two) times daily with a meal.     multivitamin with minerals Tabs tablet  Take 1 tablet by mouth daily.     simvastatin 10 MG tablet  Commonly known as:  ZOCOR  Take 10 mg by mouth daily at 6 PM.     thiamine 100 MG tablet  Take 1 tablet (100 mg total) by mouth daily.       No Known Allergies    The results of significant diagnostics from this hospitalization (including imaging, microbiology, ancillary and laboratory) are listed below for reference.    Significant Diagnostic Studies: Ct Head Wo Contrast  09/16/2013   CLINICAL DATA:   Dizziness, slurred speech, numbness  EXAM: CT HEAD WITHOUT CONTRAST  TECHNIQUE: Contiguous axial images were obtained from the base of the skull through the vertex without intravenous contrast.  COMPARISON:  07/31/2013  FINDINGS: The diffuse areas of low attenuation project within the subcortical, deep, and periventricular white matter regions. There is no evidence of intra-axial or extra-axial fluid collections or evidence of acute hemorrhage. There is no evidence of mass effect. The ventricles and cisterns are patent. The pons, the basal ganglia regions, and cerebellum are unremarkable. There is no evidence of a depressed skull fracture. Visualized paranasal sinuses and mastoid air cells are patent.  IMPRESSION: Chronic and involutional changes without evidence of focal acute abnormalities.   Electronically Signed   By: Salome Holmes M.D.   On: 09/16/2013 12:06   Mr [name] Head Wo Contrast  09/16/2013   CLINICAL DATA:  Stroke.  Left arm numbness.  EXAM: MRA HEAD WITHOUT CONTRAST  TECHNIQUE: Angiographic images of the Circle of Willis were obtained using MRA technique without intravenous contrast.  COMPARISON:  MRA 07/31/2013  FINDINGS: Both vertebral arteries are patent. Right posterior inferior cerebellar artery is patent. Left posterior inferior cerebellar artery not visualized but could be outside the field of view. The basilar is widely patent. 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 Posterior cerebral arteries are patent bilaterally.  The internal carotid artery is widely patent bilaterally. Anterior and middle cerebral arteries are patent bilaterally without significant stenosis.  Negative for cerebral aneurysm.  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 Otherwise no significant intracranial stenosis.   Electronically Signed   By: Marlan Palau M.D.   On: 09/16/2013 16:33   Mr  Brain Wo Contrast  09/16/2013   CLINICAL DATA:  Stroke.  Hypertension and diabetes.  EXAM: MRI HEAD WITHOUT CONTRAST  TECHNIQUE: Multiplanar, multiecho pulse sequences of the brain and surrounding structures were obtained without intravenous contrast.  COMPARISON:  MRI 07/31/2013  FINDINGS: New area of  acute infarct involving the posterior midbrain to the right of midline. This is very close to the acute infarct seen on the prior study but is more dorsally located. There remains a small amount of residual restricted diffusion in the infarct in the right anterior pons. No other areas of acute infarct are identified on today's study.  Chronic microvascular ischemic changes in the white matter and pons. Chronic micro hemorrhage in the central pons. No cortical infarct.  Ventricle size is normal.  Negative for mass lesion.  The paranasal sinuses are clear.  IMPRESSION: Small area of acute infarct in the right paramedian posterior midbrain. This is an extension of the previous acute infarct in the right pons on 07/31/2013.  Chronic microvascular ischemic changes in the white matter and pons.   Electronically Signed   By: Marlan Palauharles  Clark M.D.   On: 09/16/2013 16:34    Microbiology: No results found for this or any previous visit (from the past 240 hour(s)).   Labs: Basic Metabolic Panel:  Recent Labs Lab 09/16/13 1049 09/16/13 1108  NA 139 137  K 4.4 4.2  CL 96 96  CO2 27  --   GLUCOSE 287* 292*  BUN 11 10  CREATININE 0.75 0.80  CALCIUM 10.1  --    Liver Function Tests:  Recent Labs Lab 09/16/13 1049  AST 17  ALT 20  ALKPHOS 66  BILITOT 0.7  PROT 7.7  ALBUMIN 4.1   No results found for this basename: LIPASE, AMYLASE,  in the last 168 hours No results found for this basename: AMMONIA,  in the last 168 hours CBC:  Recent Labs Lab 09/16/13 1049 09/16/13 1108  WBC 8.1  --   NEUTROABS 5.4  --   HGB 15.1 15.6  HCT 42.8 46.0  MCV 96.8  --   PLT 229  --    Cardiac  Enzymes:  Recent Labs Lab 09/16/13 1049  TROPONINI <0.30   BNP: BNP (last 3 results) No results found for this basename: PROBNP,  in the last 8760 hours CBG:  Recent Labs Lab 09/16/13 1033 09/16/13 1634 09/16/13 2119 09/17/13 0807 09/17/13 1209  GLUCAP 257* 114* 169* 173* 232*       Signed:  Kellis Mcadam  [facility] 09/17/2013, 5:30 PM

## 2013-09-17 NOTE — Evaluation (Signed)
Clinical/Bedside Swallow Evaluation Patient Details  Name: Keith [name] MRN: [id number] Date of Birth: 1946/07/17  Today's Date: 09/17/2013 Time: 1100-1120 SLP Time Calculation (min): 20 min  Past Medical History:  Past Medical History  Diagnosis Date  . Diabetes mellitus without complication   . Hypertension   . Stroke    Past Surgical History:  Past Surgical History  Procedure Laterality Date  . Prostate surgery    . Knee surgery     HPI:  Keith [name] is a 68 y.o. male with h/o recent stroke, DM and HTN who presents to the Emergency Department complaining of right hand numbness that began 3 days ago with associated diplopia, dizziness, slurred speech, headache, and worsening loss of balance.  Pt states he initially noticed sudden-onset double vision.  He then attempted to stand up and developed loss of balance and found he was unable to stand up or walk.  He also complains of numbness to the right hand and a mild headache.  Pt reports he had similar weakness after his stroke several weeks ago but it had greatly improved prior to his most recent symptoms.  He denies fevers, syncope, CP, SOB, abdominal pain, vomiting, or diarrhea.  Per previous medical records, pt was admitted on 07/31/13 with left facial droop and dysarthric speech x1 week. MRI, MRA of the brain revealed two small acute brainstem infarcts in the right paracentral pons and central-to-left paracentral medulla.  He was discharged on 12/22 after rehabilitation and at that time was able to ambulate.  He denies recent injuries.   Assessment / Plan / Recommendation Clinical Impression  Pt seen upright in chair in room for BSE. Pt demonstrated bilateral weakness throughout oral facial musculature that was worse on left side. He stated that he has been doing well with meals. Given thin liquids, solids, and pureed textures, pt demonstrated difficulty with thin liquids only, presenting immediate cough and throat clear accompanied by  watery eyes and facial flushing. SLP spoke to pt regarding nectar liquid recommendation until he became stronger. He was able to swallow nectar liquids much easier, and agreed to recommendation. Communicated results with nursing.     Aspiration Risk  Mild    Diet Recommendation Regular;Nectar-thick liquid   Liquid Administration via: Cup Medication Administration: Whole meds with puree Supervision: Patient able to self feed Compensations: Slow rate;Small sips/bites Postural Changes and/or Swallow Maneuvers: Seated upright 90 degrees;Upright 30-60 min after meal    Other  Recommendations Oral Care Recommendations: Oral care BID   Follow Up Recommendations       Frequency and Duration min 2x/week  2 weeks     Swallow Study Prior Functional Status       General Date of Onset: 09/16/13 HPI: Keith [name] is a 68 y.o. male with h/o recent stroke, DM and HTN who presents to the Emergency Department complaining of right hand numbness that began 3 days ago with associated diplopia, dizziness, slurred speech, headache, and worsening loss of balance.  Pt states he initially noticed sudden-onset double vision.  He then attempted to stand up and developed loss of balance and found he was unable to stand up or walk.  He also complains of numbness to the right hand and a mild headache.  Pt reports he had similar weakness after his stroke several weeks ago but it had greatly improved prior to his most recent symptoms.  He denies fevers, syncope, CP, SOB, abdominal pain, vomiting, or diarrhea.  Per previous medical records, pt was admitted on 07/31/13  with left facial droop and dysarthric speech x1 week. MRI, MRA of the brain revealed two small acute brainstem infarcts in the right paracentral pons and central-to-left paracentral medulla.  He was discharged on 12/22 after rehabilitation and at that time was able to ambulate.  He denies recent injuries. Type of Study: Bedside swallow evaluation Previous  Swallow Assessment: MBSS Diet Prior to this Study: Regular;Thin liquids Behavior/Cognition: Alert;Cooperative;Pleasant mood Oral Cavity - Dentition: Adequate natural dentition Self-Feeding Abilities: Able to feed self Baseline Vocal Quality: Clear;Low vocal intensity Volitional Cough: Strong Volitional Swallow: Able to elicit    Oral/Motor/Sensory Function Overall Oral Motor/Sensory Function: Impaired Labial ROM: Reduced right;Reduced left Labial Symmetry: Abnormal symmetry left Labial Strength: Reduced Labial Sensation: Within Functional Limits Lingual Symmetry: Abnormal symmetry left Lingual Strength: Reduced Lingual Sensation: Within Functional Limits Facial Symmetry: Left droop Facial Strength: Reduced Facial Sensation: Within Functional Limits   Ice Chips Ice chips: Not tested   Thin Liquid Thin Liquid: Impaired Presentation: Self Fed;Straw Pharyngeal  Phase Impairments: Cough - Immediate;Throat Clearing - Immediate    Nectar Thick Nectar Thick Liquid: Within functional limits   Honey Thick     Puree Puree: Within functional limits   Solid   GO    Solid: Within functional limits       Keith [name] S 09/17/2013,11:29 AM

## 2013-09-17 NOTE — Progress Notes (Signed)
Inpatient Diabetes Program Recommendations  AACE/ADA: New Consensus Statement on Inpatient Glycemic Control (2013)  Target Ranges:  Prepandial:   less than 140 mg/dL      Peak postprandial:   less than 180 mg/dL (1-2 hours)      Critically ill patients:  140 - 180 mg/dL   Reason for Visit: Results for [name], Keith [name] (MRN [id number]) as of 09/17/2013 14:02  Ref. Range 09/16/2013 16:34 09/16/2013 21:19 09/17/2013 08:07 09/17/2013 12:09  Glucose-Capillary Latest Range: 70-99 mg/dL 409114 (H) 811169 (H) 914173 (H) 232 (H)  Results for [name], Keith [name] (MRN [id number]) as of 09/17/2013 14:02  Ref. Range 09/16/2013 10:49  Hemoglobin A1C Latest Range: <5.7 % 7.8 (H)   Note CBG's greater than goal.   Please consider adding Lantus 10 units daily (while in the hospital) and increase Novolog correction to moderate.  Thanks, Beryl MeagerJenny Zena Vitelli, RN, BC-ADM Inpatient Diabetes Coordinator Pager [phone]

## 2013-09-17 NOTE — Progress Notes (Signed)
[facility] PROGRESS NOTE  Keith [name] ZOX:[id number] DOB: 11/04/1945 DOA: 09/16/2013 PCP: Keith Hector, MD  Assessment/Plan: Stroke: Extension of stroke in 07/2013. MRI yields small area of acute infarct in the right paramedian posterior midbrain. This is an extension of the previous acute infarct in the right pons 2014. Had carotid Dopplers and 2-D echo 2 months ago. EF of 55% and grade 1 diastolic dysfunction. Carotid Dopplers yields bilateral: mild intimal wall thickening CCA. Mild mixed plaque origin ICA. 1-39% ICA stenosis. Verebral artery flow antegrade. Lipid panel with triglyceride 254 and VLDL 51. HgA1c 7.8. Patient already on Plavix and aspirin we'll continue. Home medications include Cozaar Zocor Norvasc. Will hold antihypertensives for now to allow for permissive hypertension. Will continue Zocor. Await PT evaluation. May be candidate for inpatient rehab. await neurology consult. Will patch left eye in attempt to address some of his balance issue. Active Problems:  Essential hypertension, benign: Systolic blood pressure range 8119-147. Will hold his antihypertensives to allow for permissive hypertension. Will monitor and reintroduce when indicated   Type II or unspecified type diabetes mellitus without mention of complication, not stated as uncontrolled. Hemoglobin A1c 7.4 in November of 2014. Manages his diabetes with glipizide and metformin. Holding these for now. Provide carb modified diet. CBG [id number]. Will use sliding scale insulin for optimal glycemic control   Dysarthria due to cerebrovascular accident: Patient reports that he is currently at his baseline. Family confirms. Passed bedside swallow evaluation.   Depression with anxiety: Appears stable at baseline. Home medications include Elavil and Ativan. Continue these for now.    Code Status: full Family Communication: none present Disposition Plan: may be candidate for inpatient rehab if  needed   Consultants:  neuro  Procedures:  none  Antibiotics:  none  HPI/Subjective: Sitting on side of bed eating. Reports no more left sided numbness. Continues with "double vision"  Objective: Filed Vitals:   09/17/13 0418  BP: 130/69  Pulse: 74  Temp: 98.6 F (37 C)  Resp: 20   No intake or output data in the 24 hours ending 09/17/13 0851 Filed Weights   09/16/13 1026 09/16/13 1029  Weight: 77.111 kg (170 lb) 77.111 kg (170 lb)    Exam:   General:  Well nourished NAD  Cardiovascular: RRR No MGR No LE edema  Respiratory: normal effort BS clear bilaterally no wheeze  Abdomen: soft +BS non-tender to palpation  Musculoskeletal: good muscle tone  Neuro: speech slow somewhat slurred, bilateral grip 5/5 bilateral LE strength 5/5 right eye with inability to track.  Data Reviewed: Basic Metabolic Panel:  Recent Labs Lab 09/16/13 1049 09/16/13 1108  NA 139 137  K 4.4 4.2  CL 96 96  CO2 27  --   GLUCOSE 287* 292*  BUN 11 10  CREATININE 0.75 0.80  CALCIUM 10.1  --    Liver Function Tests:  Recent Labs Lab 09/16/13 1049  AST 17  ALT 20  ALKPHOS 66  BILITOT 0.7  PROT 7.7  ALBUMIN 4.1   No results found for this basename: LIPASE, AMYLASE,  in the last 168 hours No results found for this basename: AMMONIA,  in the last 168 hours CBC:  Recent Labs Lab 09/16/13 1049 09/16/13 1108  WBC 8.1  --   NEUTROABS 5.4  --   HGB 15.1 15.6  HCT 42.8 46.0  MCV 96.8  --   PLT 229  --    Cardiac Enzymes:  Recent Labs Lab 09/16/13 1049  TROPONINI <0.30   BNP (last  3 results) No results found for this basename: PROBNP,  in the last 8760 hours CBG:  Recent Labs Lab 09/16/13 1033 09/16/13 1634 09/16/13 2119 09/17/13 0807  GLUCAP 257* 114* 169* 173*    No results found for this or any previous visit (from the past 240 hour(s)).   Studies: Ct Head Wo Contrast  09/16/2013   CLINICAL DATA:  Dizziness, slurred speech, numbness  EXAM: CT  HEAD WITHOUT CONTRAST  TECHNIQUE: Contiguous axial images were obtained from the base of the skull through the vertex without intravenous contrast.  COMPARISON:  07/31/2013  FINDINGS: The diffuse areas of low attenuation project within the subcortical, deep, and periventricular white matter regions. There is no evidence of intra-axial or extra-axial fluid collections or evidence of acute hemorrhage. There is no evidence of mass effect. The ventricles and cisterns are patent. The pons, the basal ganglia regions, and cerebellum are unremarkable. There is no evidence of a depressed skull fracture. Visualized paranasal sinuses and mastoid air cells are patent.  IMPRESSION: Chronic and involutional changes without evidence of focal acute abnormalities.   Electronically Signed   By: Salome HolmesHector  Cooper M.D.   On: 09/16/2013 12:06   Mr Maxine GlennMra Head Wo Contrast  09/16/2013   CLINICAL DATA:  Stroke.  Left arm numbness.  EXAM: MRA HEAD WITHOUT CONTRAST  TECHNIQUE: Angiographic images of the Circle of Willis were obtained using MRA technique without intravenous contrast.  COMPARISON:  MRA 07/31/2013  FINDINGS: Both vertebral arteries are patent. Right posterior inferior cerebellar artery is patent. Left posterior inferior cerebellar artery not visualized but could be outside the field of view. The basilar is widely patent. 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 Posterior cerebral arteries are patent bilaterally.  The internal carotid artery is widely patent bilaterally. Anterior and middle cerebral arteries are patent bilaterally without significant stenosis.  Negative for cerebral aneurysm.  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 Otherwise no significant intracranial stenosis.   Electronically Signed   By: Marlan Palauharles  Clark M.D.   On: 09/16/2013 16:33   Mr Brain Wo Contrast  09/16/2013   CLINICAL DATA:   Stroke.  Hypertension and diabetes.  EXAM: MRI HEAD WITHOUT CONTRAST  TECHNIQUE: Multiplanar, multiecho pulse sequences of the brain and surrounding structures were obtained without intravenous contrast.  COMPARISON:  MRI 07/31/2013  FINDINGS: New area of acute infarct involving the posterior midbrain to the right of midline. This is very close to the acute infarct seen on the prior study but is more dorsally located. There remains a small amount of residual restricted diffusion in the infarct in the right anterior pons. No other areas of acute infarct are identified on today's study.  Chronic microvascular ischemic changes in the white matter and pons. Chronic micro hemorrhage in the central pons. No cortical infarct.  Ventricle size is normal.  Negative for mass lesion.  The paranasal sinuses are clear.  IMPRESSION: Small area of acute infarct in the right paramedian posterior midbrain. This is an extension of the previous acute infarct in the right pons on 07/31/2013.  Chronic microvascular ischemic changes in the white matter and pons.   Electronically Signed   By: Marlan Palauharles  Clark M.D.   On: 09/16/2013 16:34    Scheduled Meds: . amitriptyline  25 mg Oral QHS  . aspirin  325 mg Oral Daily  . clopidogrel  75 mg Oral Q breakfast  . enoxaparin (LOVENOX)  injection  40 mg Subcutaneous Q24H  . fluticasone  2 spray Each Nare BID  . insulin aspart  0-9 Units Subcutaneous TID WC  . simvastatin  10 mg Oral q1800   Continuous Infusions:   Principal Problem:   Stroke Active Problems:   Dysarthria due to cerebrovascular accident   Type II or unspecified type diabetes mellitus without mention of complication, not stated as uncontrolled   Essential hypertension, benign   Depression with anxiety    Time spent: 35 minutes    [facility] M  [facility] Pager [phone]. If 7PM-7AM, please contact night-coverage at [email or web address], password [email or web address] 09/17/2013, 8:51 AM  LOS: 1 day

## 2013-09-17 NOTE — Consult Note (Signed)
[place] A. Merlene Laughter, MD     [email or web address]          Darin Arndt is an 68 y.o. male.   ASSESSMENT/PLAN: 1. 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 This is a lacunar event. Therefore, the patient should be on single antiplatelet agent. It is reasonable for the patient to continue on dual antiplatelet agent for now but after a week. After I suggest that the patient discontinue the Plavix and continue with aspirin. The patient should continue with the other risk factor modification including diabetes control, Blood pressure control and the use of a statin medication.   The patient is a 68 year old white male who presents with the acute onset of binocular horizontal diplopia and gait instability. The patient does not report having focal weakness. He does report having some left-sided headache. The patient had a similar event about a month ago when he presented with the acute onset of binocular vertical diplopia and severe headache. The patient probably also had dysarthria that time which has not gotten better. The chart indicates that he had focal weakness but he denies this today. The patient does not have any weakness today and did not have any weakness on the last presentation per the patient. Again, he did have dysarthria which persists. The patient was on aspirin when he had his initial event 4 weeks ago. He was placed on Plavix dual antiplatelet agent and has been taking both of these medications since then. The patient workup at that time included a MRI/MRA. The MRI showed a right brainstem infarct. MRA was unremarkable. The patient denies other symptoms. There is no history of chest pain, shortness of breath, GI or GU symptoms. Review of systems otherwise negative.  GENERAL: This very pleasant man in no acute distress.  HEENT: Neck is supple and head is normocephalic atraumatic.  ABDOMEN:  soft  EXTREMITIES: No edema   BACK: Unremarkable.  SKIN: Normal by inspection.    MENTAL STATUS: Alert and oriented. The patient has a moderate dysarthria. The language and cognition are generally intact. Judgment and insight normal.   CRANIAL NERVES: Pupils are equal, round and reactive to light and accommodation; there is no significant nystagmus; visual fields are full; upper and lower facial muscles are normal in strength and symmetric, there is no flattening of the nasolabial folds; tongue appears to deviate slightly to the left; uvula is midline; shoulder elevation is normal. The pupils are mid position but the right pupil has a mild left beating nystagmus in the primary position in which is worse with movement to the left. The right pupil does not cross the midline to the left. Both eyes moved to the right without any problems. Vertical eye movements are normal.  MOTOR: Normal tone, bulk and strength; no pronator drift.  COORDINATION: No rest tremor; no intention tremor; no postural tremor; no bradykinesia. There appears to be solid evidence of dysmetria on finger to nose bilaterally especially at the end of finger to nose bilaterally.  REFLEXES: Deep tendon reflexes are symmetrical and normal throughout except at the knees where they are absent and 1+ at the ankles. Plantar responses are flexor on the right but extensor on the left.   SENSATION: Normal to light touch, temperature, and pinprick.    Past Medical History  Diagnosis Date  . Diabetes mellitus without complication   . Hypertension   . Stroke     Past Surgical History  Procedure Laterality Date  . Prostate  surgery    . Knee surgery      History reviewed. No pertinent family history.  Social History:  reports that he has quit smoking. He does not have any smokeless tobacco history on file. He reports that he drinks alcohol. He reports that he does not use illicit drugs.  Allergies: No Known  Allergies  Medications: Prior to Admission medications   Medication Sig Start Date End Date Taking? Authorizing Provider  amitriptyline (ELAVIL) 25 MG tablet Take 25 mg by mouth at bedtime.   Yes Historical Provider, MD  amLODipine (NORVASC) 5 MG tablet Take 5 mg by mouth daily.   Yes Historical Provider, MD  aspirin 325 MG tablet Take 1 tablet (325 mg total) by mouth daily. 08/05/13  Yes Beverlyn Roux, MD  cholecalciferol (VITAMIN D) 1000 UNITS tablet Take 1,000 Units by mouth daily.   Yes Historical Provider, MD  clopidogrel (PLAVIX) 75 MG tablet Take 75 mg by mouth daily with breakfast.   Yes Historical Provider, MD  fluticasone (FLONASE) 50 MCG/ACT nasal spray Place 2 sprays into both nostrils 2 (two) times daily.   Yes Historical Provider, MD  Garlic 073 MG TABS Take 1 tablet by mouth daily.   Yes Historical Provider, MD  glipiZIDE (GLUCOTROL) 10 MG tablet Take 10 mg by mouth 2 (two) times daily before a meal.   Yes Historical Provider, MD  HYDROcodone-acetaminophen (NORCO/VICODIN) 5-325 MG per tablet Take 1 tablet by mouth every 6 (six) hours as needed for moderate pain.   Yes Historical Provider, MD  ibuprofen (ADVIL,MOTRIN) 600 MG tablet Take 600 mg by mouth every 8 (eight) hours as needed for moderate pain.   Yes Historical Provider, MD  LORazepam (ATIVAN) 0.5 MG tablet Take 1 tablet (0.5 mg total) by mouth every 6 (six) hours as needed for anxiety. 08/05/13  Yes Beverlyn Roux, MD  losartan (COZAAR) 100 MG tablet Take 100 mg by mouth daily.   Yes Historical Provider, MD  metFORMIN (GLUCOPHAGE) 500 MG tablet Take 1,000 mg by mouth 2 (two) times daily with a meal.    Yes Historical Provider, MD  Multiple Vitamin (MULTIVITAMIN WITH MINERALS) TABS tablet Take 1 tablet by mouth daily.   Yes Historical Provider, MD  Omega-3 Fatty Acids (FISH OIL) 1000 MG CAPS Take 1 capsule by mouth 2 (two) times daily.   Yes Historical Provider, MD  simvastatin (ZOCOR) 10 MG tablet Take 10 mg by mouth daily at 6  PM.   Yes Historical Provider, MD  thiamine 100 MG tablet Take 1 tablet (100 mg total) by mouth daily. 08/05/13  Yes Beverlyn Roux, MD      Scheduled Meds: . amitriptyline  25 mg Oral QHS  . aspirin  325 mg Oral Daily  . clopidogrel  75 mg Oral Q breakfast  . enoxaparin (LOVENOX) injection  40 mg Subcutaneous Q24H  . fluticasone  2 spray Each Nare BID  . insulin aspart  0-9 Units Subcutaneous TID WC  . simvastatin  10 mg Oral q1800   Continuous Infusions:  PRN Meds:.HYDROcodone-acetaminophen, ibuprofen, LORazepam, senna-docusate    Blood pressure 130/69, pulse 74, temperature 98.6 F (37 C), temperature source Oral, resp. rate 20, height 6' 1"  (1.854 m), weight 77.111 kg (170 lb), SpO2 94.00%.   Results for orders placed during the hospital encounter of 09/16/13 (from the past 48 hour(s))  GLUCOSE, CAPILLARY     Status: Abnormal   Collection Time    09/16/13 10:33 AM      Result Value Range  Glucose-Capillary 257 (*) 70 - 99 mg/dL  ETHANOL     Status: None   Collection Time    09/16/13 10:49 AM      Result Value Range   Alcohol, Ethyl (B) <11  0 - 11 mg/dL   Comment:            LOWEST DETECTABLE LIMIT FOR     SERUM ALCOHOL IS 11 mg/dL     FOR MEDICAL PURPOSES ONLY  PROTIME-INR     Status: None   Collection Time    09/16/13 10:49 AM      Result Value Range   Prothrombin Time 13.1  11.6 - 15.2 seconds   INR 1.01  0.00 - 1.49  APTT     Status: None   Collection Time    09/16/13 10:49 AM      Result Value Range   aPTT 30  24 - 37 seconds  CBC     Status: Abnormal   Collection Time    09/16/13 10:49 AM      Result Value Range   WBC 8.1  4.0 - 10.5 K/uL   RBC 4.42  4.22 - 5.81 MIL/uL   Hemoglobin 15.1  13.0 - 17.0 g/dL   HCT 42.8  39.0 - 52.0 %   MCV 96.8  78.0 - 100.0 fL   MCH 34.2 (*) 26.0 - 34.0 pg   MCHC 35.3  30.0 - 36.0 g/dL   RDW 11.9  11.5 - 15.5 %   Platelets 229  150 - 400 K/uL  DIFFERENTIAL     Status: None   Collection Time    09/16/13 10:49 AM       Result Value Range   Neutrophils Relative % 67  43 - 77 %   Neutro Abs 5.4  1.7 - 7.7 K/uL   Lymphocytes Relative 24  12 - 46 %   Lymphs Abs 2.0  0.7 - 4.0 K/uL   Monocytes Relative 8  3 - 12 %   Monocytes Absolute 0.6  0.1 - 1.0 K/uL   Eosinophils Relative 1  0 - 5 %   Eosinophils Absolute 0.1  0.0 - 0.7 K/uL   Basophils Relative 0  0 - 1 %   Basophils Absolute 0.0  0.0 - 0.1 K/uL  COMPREHENSIVE METABOLIC PANEL     Status: Abnormal   Collection Time    09/16/13 10:49 AM      Result Value Range   Sodium 139  137 - 147 mEq/L   Potassium 4.4  3.7 - 5.3 mEq/L   Chloride 96  96 - 112 mEq/L   CO2 27  19 - 32 mEq/L   Glucose, Bld 287 (*) 70 - 99 mg/dL   BUN 11  6 - 23 mg/dL   Creatinine, Ser 0.75  0.50 - 1.35 mg/dL   Calcium 10.1  8.4 - 10.5 mg/dL   Total Protein 7.7  6.0 - 8.3 g/dL   Albumin 4.1  3.5 - 5.2 g/dL   AST 17  0 - 37 U/L   ALT 20  0 - 53 U/L   Alkaline Phosphatase 66  39 - 117 U/L   Total Bilirubin 0.7  0.3 - 1.2 mg/dL   GFR calc non Af Amer >90  >90 mL/min   GFR calc Af Amer >90  >90 mL/min   Comment: (NOTE)     The eGFR has been calculated using the CKD EPI equation.     This calculation has not been  validated in all clinical situations.     eGFR's persistently <90 mL/min signify possible Chronic Kidney     Disease.  TROPONIN I     Status: None   Collection Time    09/16/13 10:49 AM      Result Value Range   Troponin I <0.30  <0.30 ng/mL   Comment:            Due to the release kinetics of cTnI,     a negative result within the first hours     of the onset of symptoms does not rule out     myocardial infarction with certainty.     If myocardial infarction is still suspected,     repeat the test at appropriate intervals.  HEMOGLOBIN A1C     Status: Abnormal   Collection Time    09/16/13 10:49 AM      Result Value Range   Hemoglobin A1C 7.8 (*) <5.7 %   Comment: (NOTE)                                                                                According to the ADA Clinical Practice Recommendations for 2011, when     HbA1c is used as a screening test:      >=6.5%   Diagnostic of Diabetes Mellitus               (if abnormal result is confirmed)     5.7-6.4%   Increased risk of developing Diabetes Mellitus     References:Diagnosis and Classification of Diabetes Mellitus,Diabetes     MIWO,0321,22(QMGNO 1):S62-S69 and Standards of Medical Care in             Diabetes - 2011,Diabetes Care,2011,34 (Suppl 1):S11-S61.   Mean Plasma Glucose 177 (*) <117 mg/dL   Comment: Performed at [facility]     Status: Abnormal   Collection Time    09/16/13 10:49 AM      Result Value Range   Cholesterol 122  0 - 200 mg/dL   Triglycerides 254 (*) <150 mg/dL   HDL 47  >39 mg/dL   Total CHOL/HDL Ratio 2.6     VLDL 51 (*) 0 - 40 mg/dL   LDL Cholesterol 24  0 - 99 mg/dL   Comment:            Total Cholesterol/HDL:CHD Risk     Coronary Heart Disease Risk Table                         Men   Women      1/2 Average Risk   3.4   3.3      Average Risk       5.0   4.4      2 X Average Risk   9.6   7.1      3 X Average Risk  23.4   11.0                Use the calculated Patient Ratio     above and the CHD Risk Table     to determine the patient's CHD  Risk.                ATP III CLASSIFICATION (LDL):      <100     mg/dL   Optimal      100-129  mg/dL   Near or Above                        Optimal      130-159  mg/dL   Borderline      160-189  mg/dL   High      >190     mg/dL   Very High  POCT I-STAT, CHEM 8     Status: Abnormal   Collection Time    09/16/13 11:08 AM      Result Value Range   Sodium 137  137 - 147 mEq/L   Potassium 4.2  3.7 - 5.3 mEq/L   Chloride 96  96 - 112 mEq/L   BUN 10  6 - 23 mg/dL   Creatinine, Ser 0.80  0.50 - 1.35 mg/dL   Glucose, Bld 292 (*) 70 - 99 mg/dL   Calcium, Ion 1.33 (*) 1.13 - 1.30 mmol/L   TCO2 28  0 - 100 mmol/L   Hemoglobin 15.6  13.0 - 17.0 g/dL   HCT 46.0  39.0 - 52.0 %  URINE  RAPID DRUG SCREEN (HOSP PERFORMED)     Status: None   Collection Time    09/16/13 11:25 AM      Result Value Range   Opiates NONE DETECTED  NONE DETECTED   Cocaine NONE DETECTED  NONE DETECTED   Benzodiazepines NONE DETECTED  NONE DETECTED   Amphetamines NONE DETECTED  NONE DETECTED   Tetrahydrocannabinol NONE DETECTED  NONE DETECTED   Barbiturates NONE DETECTED  NONE DETECTED   Comment:            DRUG SCREEN FOR MEDICAL PURPOSES     ONLY.  IF CONFIRMATION IS NEEDED     FOR ANY PURPOSE, NOTIFY LAB     WITHIN 5 DAYS.                LOWEST DETECTABLE LIMITS     FOR URINE DRUG SCREEN     Drug Class       Cutoff (ng/mL)     Amphetamine      1000     Barbiturate      200     Benzodiazepine   106     Tricyclics       269     Opiates          300     Cocaine          300     THC              50  URINALYSIS, ROUTINE W REFLEX MICROSCOPIC     Status: Abnormal   Collection Time    09/16/13 11:25 AM      Result Value Range   Color, Urine YELLOW  YELLOW   APPearance CLEAR  CLEAR   Specific Gravity, Urine 1.015  1.005 - 1.030   pH 6.0  5.0 - 8.0   Glucose, UA 500 (*) NEGATIVE mg/dL   Hgb urine dipstick NEGATIVE  NEGATIVE   Bilirubin Urine NEGATIVE  NEGATIVE   Ketones, ur NEGATIVE  NEGATIVE mg/dL   Protein, ur NEGATIVE  NEGATIVE mg/dL   Urobilinogen, UA 0.2  0.0 - 1.0 mg/dL   Nitrite NEGATIVE  NEGATIVE   Leukocytes, UA NEGATIVE  NEGATIVE   Comment: MICROSCOPIC NOT DONE ON URINES WITH NEGATIVE PROTEIN, BLOOD, LEUKOCYTES, NITRITE, OR GLUCOSE <1000 mg/dL.  GLUCOSE, CAPILLARY     Status: Abnormal   Collection Time    09/16/13  4:34 PM      Result Value Range   Glucose-Capillary 114 (*) 70 - 99 mg/dL   Comment 1 Notify RN    GLUCOSE, CAPILLARY     Status: Abnormal   Collection Time    09/16/13  9:19 PM      Result Value Range   Glucose-Capillary 169 (*) 70 - 99 mg/dL   Comment 1 Documented in Chart     Comment 2 Notify RN    GLUCOSE, CAPILLARY     Status: Abnormal   Collection  Time    09/17/13  8:07 AM      Result Value Range   Glucose-Capillary 173 (*) 70 - 99 mg/dL  GLUCOSE, CAPILLARY     Status: Abnormal   Collection Time    09/17/13 12:09 PM      Result Value Range   Glucose-Capillary 232 (*) 70 - 99 mg/dL   Comment 1 Notify RN      Ct Head Wo Contrast  09/16/2013   CLINICAL DATA:  Dizziness, slurred speech, numbness  EXAM: CT HEAD WITHOUT CONTRAST  TECHNIQUE: Contiguous axial images were obtained from the base of the skull through the vertex without intravenous contrast.  COMPARISON:  07/31/2013  FINDINGS: The diffuse areas of low attenuation project within the subcortical, deep, and periventricular white matter regions. There is no evidence of intra-axial or extra-axial fluid collections or evidence of acute hemorrhage. There is no evidence of mass effect. The ventricles and cisterns are patent. The pons, the basal ganglia regions, and cerebellum are unremarkable. There is no evidence of a depressed skull fracture. Visualized paranasal sinuses and mastoid air cells are patent.  IMPRESSION: Chronic and involutional changes without evidence of focal acute abnormalities.   Electronically Signed   By: Margaree Mackintosh M.D.   On: 09/16/2013 12:06   Mr Jodene Nam Head Wo Contrast  09/16/2013   CLINICAL DATA:  Stroke.  Left arm numbness.  EXAM: MRA HEAD WITHOUT CONTRAST  TECHNIQUE: Angiographic images of the Circle of Willis were obtained using MRA technique without intravenous contrast.  COMPARISON:  MRA 07/31/2013  FINDINGS: Both vertebral arteries are patent. Right posterior inferior cerebellar artery is patent. Left posterior inferior cerebellar artery not visualized but could be outside the field of view. The basilar is widely patent. 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 Posterior cerebral arteries are patent bilaterally.  The internal carotid artery is widely patent  bilaterally. Anterior and middle cerebral arteries are patent bilaterally without significant stenosis.  Negative for cerebral aneurysm.  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 Otherwise no significant intracranial stenosis.   Electronically Signed   By: Franchot Gallo M.D.   On: 09/16/2013 16:33   Mr Brain Wo Contrast  09/16/2013   CLINICAL DATA:  Stroke.  Hypertension and diabetes.  EXAM: MRI HEAD WITHOUT CONTRAST  TECHNIQUE: Multiplanar, multiecho pulse sequences of the brain and surrounding structures were obtained without intravenous contrast.  COMPARISON:  MRI 07/31/2013  FINDINGS: New area of acute infarct involving the posterior midbrain to the right of midline. This is very close to the acute infarct seen on the prior study but is more  dorsally located. There remains a small amount of residual restricted diffusion in the infarct in the right anterior pons. No other areas of acute infarct are identified on today's study.  Chronic microvascular ischemic changes in the white matter and pons. Chronic micro hemorrhage in the central pons. No cortical infarct.  Ventricle size is normal.  Negative for mass lesion.  The paranasal sinuses are clear.  IMPRESSION: Small area of acute infarct in the right paramedian posterior midbrain. This is an extension of the previous acute infarct in the right pons on 07/31/2013.  Chronic microvascular ischemic changes in the white matter and pons.   Electronically Signed   By: Franchot Gallo M.D.   On: 09/16/2013 16:34     ECHO 08-01-13  Study Conclusions  - Left ventricle: The cavity size was normal. Wall thickness was normal. Systolic function was normal. The estimated ejection fraction was in the range of 55% to 60%. Wall motion was normal; there were no regional wall motion abnormalities. Doppler parameters are consistent with abnormal left ventricular relaxation (grade 1 diastolic dysfunction). - Pulmonary arteries: PA peak  pressure: 42m Hg (S).     RPR NR 08-03-13   Dinara Lupu A. DMerlene Laughter M.D.  Diplomate, ATax adviserof Psychiatry and Neurology ( Neurology). 09/17/2013, 2:27 PM

## 2013-09-17 NOTE — Evaluation (Signed)
Physical Therapy Evaluation Patient Details Name: Keith [name] MRN: [id number] DOB: 08/13/1946 Today's Date: 09/17/2013 Time: [id number] PT Time Calculation (min): 63 min  PT Assessment / Plan / Recommendation History of Present Illness  Mr Keith [name] is a 67yo right handed male admitted with diagnosis of stroke.  He reports waking yesterday with numbness/tingling on his right side, difficulty with speech and vision.  MRI yields small area of acute infarct in the right paramedian posterior midbrain and is noted as an extension of the previous acute infarct in the right pons 2014.  PMH includes DM, HTN.    Clinical Impression   Pt was alert and oriented, very cooperative.  His speech is slurred but he states that this is just how he talks. No weakness was found in the trunk or LEs.  He does report some numbness of the distal RLE. He was found to have weakness of the right eye musculature with inability to track medially as head turns to the left.  This compounds his balance deficiency.  Currently, he is unable to ambulate without the assistance of a rolling walker.  Even with this, he had LOB x1 with falling left.  He was able to self correct.  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  He is an extremely high fall risk [facility].  We did instruct in gaze exercises.  He does appear to be in some denial about his symptoms and strongly wants to return home at d/c even though he does not have any structured support at home.    PT Assessment  Patient needs continued PT services    Follow Up Recommendations  CIR    Does the patient have the potential to tolerate intense rehabilitation      Barriers to Discharge Decreased caregiver support;Inaccessible home environment pt has 3 steps to enter home with no handrail    Equipment Recommendations  None recommended by PT    Recommendations for Other  Services     Frequency Min 6X/week    Precautions / Restrictions Precautions Precautions: Fall Precaution Comments: patient with decreased tracking of right eye causing distorted vision and balance deficits Restrictions Weight Bearing Restrictions: No   Pertinent Vitals/Pain       Mobility  Bed Mobility Overal bed mobility: Independent Transfers Overall transfer level: Modified independent Equipment used: None General transfer comment: pt does lean LEs against bed upon standing in order to maintain balance Ambulation/Gait Ambulation/Gait assistance: Modified independent (Device/Increase time) Ambulation Distance (Feet): 300 Feet Assistive device: Rolling walker (2 wheeled) Gait Pattern/deviations: WFL(Within Functional Limits) Gait velocity: tends to walk rapidly and needed to be instructed in slowing pace, especially with turns General Gait Details: pt is unable to ambulate due to severe balance/visual deficit Modified Rankin (Stroke Patients Only) Pre-Morbid Rankin Score: No significant disability Modified Rankin: Moderately severe disability    Exercises Other Exercises Other Exercises: gaze exercise   PT Diagnosis: Difficulty walking  PT Problem List: Decreased balance;Decreased mobility;Decreased safety awareness;Other (comment) (visual deficit) PT Treatment Interventions: Gait training;Stair training;Functional mobility training;Therapeutic exercise;Balance training;Other (comment) (gaze exercise)     PT Goals(Current goals can be found in the care plan section) Acute Rehab PT Goals Patient Stated Goal: wants to return home at d/c PT Goal Formulation: With patient Time For Goal Achievement: 10/01/13 Potential to Achieve Goals: Good  Visit Information  Last PT Received On: 09/17/13 History of Present  Illness: Mr Keith [name] is a 68yo right handed male admitted with diagnosis of stroke.  He reports waking yesterday with numbness/tingling on his right side, difficulty  with speech and vision.  MRI yields small area of acute infarct in the right paramedian posterior midbrain and is noted as an extension of the previous acute infarct in the right pons 2014.  PMH includes DM, HTN.         Prior Functioning  Home Living Family/patient expects to be discharged to:: Inpatient rehab Living Arrangements: Alone (reports son lives across from him and provides assistance as needed since his last stroke. ) Prior Function Level of Independence: Independent with assistive device(s) Comments: pt does admit to having had "a few falls" prior to this new stroke Communication Communication: HOH    Cognition  Cognition Arousal/Alertness: Awake/alert Behavior During Therapy: WFL for tasks assessed/performed Overall Cognitive Status: Within Functional Limits for tasks assessed    Extremity/Trunk Assessment Lower Extremity Assessment Lower Extremity Assessment: Overall WFL for tasks assessed (no proprioceptive or strength deficits noted) Cervical / Trunk Assessment Cervical / Trunk Assessment: Normal   Balance Standardized Balance Assessment Standardized Balance Assessment : Berg Balance Test Berg Balance Test Sit to Stand: Able to stand  independently using hands Standing Unsupported: Able to stand 2 minutes with supervision Sitting with Back Unsupported but Feet Supported on Floor or Stool: Able to sit safely and securely 2 minutes Stand to Sit: Controls descent by using hands Transfers: Able to transfer safely, definite need of hands Standing Unsupported with Eyes Closed: Able to stand 10 seconds with supervision Standing Ubsupported with Feet Together: Able to place feet together independently and stand for 1 minute with supervision From Standing, Reach Forward with Outstretched Arm: Reaches forward but needs supervision From Standing Position, Pick up Object from Floor: Able to pick up shoe, needs supervision From Standing Position, Turn to Look Behind Over each  Shoulder: Needs assist to keep from losing balance and falling Turn 360 Degrees: Able to turn 360 degrees safely but slowly Standing Unsupported, Alternately Place Feet on Step/Stool: Needs assistance to keep from falling or unable to try Standing Unsupported, One Foot in Front: Able to take small step independently and hold 30 seconds Standing on One Leg: Unable to try or needs assist to prevent fall Total Score: 30  End of Session PT - End of Session Equipment Utilized During Treatment: Gait belt Activity Tolerance: Patient tolerated treatment well Patient left: in chair;with call bell/phone within reach;with chair alarm set Nurse Communication: Mobility status  GP Functional Assessment Tool Used: clinical judgement Functional Limitation: Mobility: Walking and moving around Mobility: Walking and Moving Around Current Status (Z6109): At least 20 percent but less than 40 percent impaired, limited or restricted Mobility: Walking and Moving Around Goal Status [phone]): At least 1 percent but less than 20 percent impaired, limited or restricted   Konrad Penta 09/17/2013, 11:07 AM

## 2013-09-17 NOTE — Progress Notes (Signed)
Contacted by Amy, CM to assess pt for a possible [facility]. Per his PT noted today he is modified independent 300 feet with a RW. He is appropriate for outpt or HH at this time. Unable to justify intense inpt rehab needs at this level of mobility. Please call me with questions. [phone]

## 2013-09-17 NOTE — Evaluation (Addendum)
Occupational Therapy Evaluation Patient Details Name: Keith [name] MRN: [id number] DOB: 1945-12-11 Today's Date: 09/17/2013 Time: 7829-5621 OT Time Calculation (min): 35 min Evaluation 0955-1015 (20') Self cares 1015-1030 (15')   OT Assessment / Plan / Recommendation History of present illness Keith [name] is a 67yo right handed male admitted with diagnosis of stroke.  He reports waking yesterday with numbness/tingling on his right side, difficulty with speech and vision.  MRI yields small area of acute infarct in the right paramedian posterior midbrain and is noted as an extension of the previous acute infarct in the right pons 2014.  PMH includes DM, HTN.     Clinical Impression   Patient presents with visual occulomotor difficulties this date which limit his functional independence with mobility and ADL tasks.  He demos decreased balance and safety issues along with complaints of visual diplopia. Patient states he desires to return home at discharge however feel patient would benefit from skilled  Rehab services to maximize functional potential/independence.  Recommend CIR services and continued patient/family education.     OT Assessment  Patient needs continued OT Services    Follow Up Recommendations  CIR    Barriers to Discharge   lives alone - now with vision difficulties; steps to enter no railing   Equipment Recommendations       Recommendations for Other Services Rehab consult  Frequency  Min 5X/week    Precautions / Restrictions Precautions Precautions: Fall Precaution Comments: patient with decreased tracking of right eye causing distorted vision and balance deficits Restrictions Weight Bearing Restrictions: No    ADL  Eating/Feeding: Modified independent Grooming: Supervision/safety Where Assessed - Grooming: Supported standing Lower Body Dressing: Supervision/safety    OT Diagnosis: Disturbance of vision  OT Problem List: Impaired vision/perception;Other (comment)  (balance ) OT Treatment Interventions: Self-care/ADL training;Therapeutic exercise;Therapeutic activities;Visual/perceptual remediation/compensation;Patient/family education;Balance training   OT Goals(Current goals can be found in the care plan section) Acute Rehab OT Goals Patient Stated Goal: wants to return home at d/c OT Goal Formulation: With patient Time For Goal Achievement: 10/01/13 Potential to Achieve Goals: Good ADL Goals Pt/caregiver will Perform Home Exercise Program: With written HEP provided (for visual gaze, occular strengthening) Additional ADL Goal #1: patient will incorporate visual compensatory stragegies to increase ADL mobility independence at least 50% of time with min verbal cues.   Visit Information  History of Present Illness: Keith [name] is a 68yo right handed male admitted with diagnosis of stroke.  He reports waking yesterday with numbness/tingling on his right side, difficulty with speech and vision.  MRI yields small area of acute infarct in the right paramedian posterior midbrain and is noted as an extension of the previous acute infarct in the right pons 2014.  PMH includes DM, HTN.         Prior Functioning     Home Living Family/patient expects to be discharged to:: Inpatient rehab Living Arrangements: Alone Available Help at Discharge: Family Type of Home: House Home Access: Stairs to enter Secretary/administrator of Steps: 3 Entrance Stairs-Rails: None Home Layout: One level Home Equipment: None Prior Function Level of Independence: Independent with assistive device(s) Comments: pt does admit to having had "a few falls" prior to this new stroke Communication Communication: HOH Dominant Hand: Right         Vision/Perception Vision - History Baseline Vision: Wears glasses only for reading Visual History: Other (comment) (reports diplopia following previous stroke which pt states was resolved ) Patient Visual Report: Blurring of vision;Eye  fatigue/eye pain/headache;Other (comment) (  medial rectus impairment of right eye with convergence difficulties and ?? vestibular involvment.) Vision - Assessment Eye Alignment:  (right eye difficulties in medial plane) Additional Comments: patient reports double vision slightly in midline gaze and significant in left lateral looking planes due to poor/lack of tracking of right eye in medial  aspect   Cognition  Cognition Arousal/Alertness: Awake/alert Behavior During Therapy: WFL for tasks assessed/performed Overall Cognitive Status: Within Functional Limits for tasks assessed    Extremity/Trunk Assessment Upper Extremity Assessment Upper Extremity Assessment: Overall WFL for tasks assessed (no proprioceptive or strength deficits noted.  patient with slight complaint of numbness in his right hand for a little over the last month) Lower Extremity Assessment Lower Extremity Assessment: Defer to PT evaluation Cervical / Trunk Assessment Cervical / Trunk Assessment: Normal     Mobility Bed Mobility Overal bed mobility: Independent Transfers Overall transfer level: Independent (supervision/safety - visual difficulties ) Equipment used: None General transfer comment: pt does lean LEs against chair upon standing in order to maintain balance     Exercise Other Exercises Other Exercises: gaze exercise   Balance     End of Session OT - End of Session Activity Tolerance: Patient tolerated treatment well Patient left: in chair;with bed alarm set;with call bell/phone within reach  GO Functional Limitation: Self care Self Care Current Status (Z6109([id number]): At least 20 percent but less than 40 percent impaired, limited or restricted Self Care Goal Status (U0454(G8988): At least 1 percent but less than 20 percent impaired, limited or restricted   Velora MediateHeather Hammond Obeirne, OTR/L 09/17/2013, 2:08 PM

## 2013-09-21 NOTE — Discharge Summary (Signed)
Physician Discharge Summary  Tim Corriher ZOX:[id number] DOB: 12/25/1945 DOA: 09/16/2013  PCP: Josue Hector, MD  Admit date: 09/16/2013 Discharge date: 09/17/2013  Time spent: 40 minutes  Recommendations for Outpatient Follow-up:  1. Follow up with neurology in 4 weeks 2. Discontinue plavix after 2 weeks  Discharge Diagnoses:  Principal Problem:   CVA (cerebral vascular accident) Active Problems:   Dysarthria due to cerebrovascular accident   Type II or unspecified type diabetes mellitus without mention of complication, not stated as uncontrolled   Essential hypertension, benign   Depression with anxiety   Stroke   Discharge Condition: stable  Diet recommendation: low salt, low carb  Filed Weights   09/16/13 1026 09/16/13 1029  Weight: 77.111 kg (170 lb) 77.111 kg (170 lb)    History of present illness:  Keith [name] is a very pleasant 68 y.o. male with past medical history that include diabetes, hypertension, 2 small acute brainstem infarct 2 months ago since emergency Department chief complaint of diplopia numbness and tingling of the right arm and leg and unsteady gait. Information is obtained from the patient. In the case that 3 days ago he awakened and had "double vision. He reports that he sees 2 of everything "side-by-side". He says that the last time he had a stroke he had double vision but everything was on top of each other. In addition he developed right arm and leg numbness and tingling. He went to stand up and developed loss of balance and found it very difficult to walk. He denies any falls. Associated symptoms include mild headache and dizziness. He also has some slurred speech and facial droop but he indicates that this has not changed dense stroke 2 months ago. He was discharged on December 22 after rehabilitation and at that time he was able to ambulate without a cane or walker. He denies any chest pain palpitations shortness of breath cough. He denies any fever  chills dysuria hematuria frequency or urgency. He denies any abdominal pain nausea vomiting. His last bowel movement was today was normal in color and consistency. Workup in the emergency room yields a serum glucose of 292 otherwise lab work is unremarkable. CT of the head yields chronic and involutional changes without evidence of acute abnormalities. KG yields normal sinus rhythm with septal infarct that is new since EKG in November of 2014. We are asked to admit.    Hospital Course:  Patient was admitted with acute diplopia, numbness and tingling in right upper and lower extremities.  His symptoms began several days before arrival to the hospital. Her underwent MRI of the brain which revealed an acute infarct in the right paramedian posterior midbrain. Patient recently was admitted for stroke in 11/14.  At that time echocardiogram and carotid dopplers were done and therefore were not repeated during this admission.  LDL was found to be <100.  A1C was mildly elevated at 7.8. Patient was taking aspirin and plavix prior to admission. He was seen by neurology and no further work up was recommended. It was recommended that patient continue dual antiplatelet therapy for the next 2 weeks and then change to single agent by discontinuing plavix. He will follow up with neurology in the next 4 weeks.  He was seen by physical therapy and initial recommendations were for CIR.  When CIR was contacted, it was felt that patient was too functional to require intensive therapy in CIR.  Patient does have a right medial rectus weakness which is causing diplopia.  An eye patch  has been applied to his left eye to aide with this. He has been set up with home health services.  His son reports that he will be staying with him 24/7.  Patient has been cleared for discharge by neurology.  Procedures:  none  Consultations:  Neurology  Discharge Exam: Filed Vitals:   09/17/13 1500  BP: 121/74  Pulse: 76  Temp: 97.5 F (36.4  C)  Resp: 20    General: NAD Cardiovascular: S1, S2 RRR Respiratory: CTA B  Discharge Instructions      Discharge Orders   Future Appointments Provider Department Dept Phone   10/01/2013 12:30 PM Erick ColaceAndrew E Kirsteins, MD Dr. Claudette LawsAndrew [facility] [phone]   Future Orders Complete By Expires   Diet - low sodium heart healthy  As directed    Diet Carb Modified  As directed    Face-to-face encounter (required for Medicare/Medicaid patients)  As directed    Comments:     I Athan Casalino certify that this patient is under my care and that I, or a nurse practitioner or physician's assistant working with me, had a face-to-face encounter that meets the physician face-to-face encounter requirements with this patient on 09/17/2013. The encounter with the patient was in whole, or in part for the following medical condition(s) which is the primary reason for home health care (List medical condition): patient admitted with stroke and would benefit from home health RN and PT   Questions:     The encounter with the patient was in whole, or in part, for the following medical condition, which is the primary reason for home health care:  stroke   I certify that, based on my findings, the following services are medically necessary home health services:  Nursing   Physical therapy   My clinical findings support the need for the above services:  Unsafe ambulation due to balance issues   Further, I certify that my clinical findings support that this patient is homebound due to:  Unsafe ambulation due to balance issues   Reason for Medically Necessary Home Health Services:  Skilled Nursing- Skilled Assessment/Observation   Home Health  As directed    Questions:     To provide the following care/treatments:  PT   RN   Increase activity slowly  As directed        Medication List    STOP taking these medications       ibuprofen 600 MG tablet  Commonly known as:  ADVIL,MOTRIN      TAKE  these medications       amitriptyline 25 MG tablet  Commonly known as:  ELAVIL  Take 25 mg by mouth at bedtime.     amLODipine 5 MG tablet  Commonly known as:  NORVASC  Take 5 mg by mouth daily.     aspirin 325 MG tablet  Take 1 tablet (325 mg total) by mouth daily.     cholecalciferol 1000 UNITS tablet  Commonly known as:  VITAMIN D  Take 1,000 Units by mouth daily.     clopidogrel 75 MG tablet  Commonly known as:  PLAVIX  Take 1 tablet (75 mg total) by mouth daily with breakfast. Discontinue after 14 days     Fish Oil 1000 MG Caps  Take 1 capsule by mouth 2 (two) times daily.     fluticasone 50 MCG/ACT nasal spray  Commonly known as:  FLONASE  Place 2 sprays into both nostrils 2 (two) times daily.     Garlic 100  MG Tabs  Take 1 tablet by mouth daily.     glipiZIDE 10 MG tablet  Commonly known as:  GLUCOTROL  Take 10 mg by mouth 2 (two) times daily before a meal.     HYDROcodone-acetaminophen 5-325 MG per tablet  Commonly known as:  NORCO/VICODIN  Take 1 tablet by mouth every 6 (six) hours as needed for moderate pain.     LORazepam 0.5 MG tablet  Commonly known as:  ATIVAN  Take 1 tablet (0.5 mg total) by mouth every 6 (six) hours as needed for anxiety.     losartan 100 MG tablet  Commonly known as:  COZAAR  Take 100 mg by mouth daily.     metFORMIN 500 MG tablet  Commonly known as:  GLUCOPHAGE  Take 1,000 mg by mouth 2 (two) times daily with a meal.     multivitamin with minerals Tabs tablet  Take 1 tablet by mouth daily.     simvastatin 10 MG tablet  Commonly known as:  ZOCOR  Take 10 mg by mouth daily at 6 PM.     thiamine 100 MG tablet  Take 1 tablet (100 mg total) by mouth daily.       No Known Allergies Follow-up Information   Follow up with Josue Hector, MD. Schedule an appointment as soon as possible for a visit in 2 weeks.   Specialty:  Family Medicine   Contact information:   [address] [place] [address] [phone]        Follow up with [facility], KOFI, MD. Schedule an appointment as soon as possible for a visit in 4 weeks.   Specialty:  Neurology   Contact information:   [address] [place] [address] [phone]       Follow up with [facility] RN [facility] )    Contact information:   [address] [place] [address] [phone]        The results of significant diagnostics from this hospitalization (including imaging, microbiology, ancillary and laboratory) are listed below for reference.    Significant Diagnostic Studies: Ct Head Wo Contrast  09/16/2013   CLINICAL DATA:  Dizziness, slurred speech, numbness  EXAM: CT HEAD WITHOUT CONTRAST  TECHNIQUE: Contiguous axial images were obtained from the base of the skull through the vertex without intravenous contrast.  COMPARISON:  07/31/2013  FINDINGS: The diffuse areas of low attenuation project within the subcortical, deep, and periventricular white matter regions. There is no evidence of intra-axial or extra-axial fluid collections or evidence of acute hemorrhage. There is no evidence of mass effect. The ventricles and cisterns are patent. The pons, the basal ganglia regions, and cerebellum are unremarkable. There is no evidence of a depressed skull fracture. Visualized paranasal sinuses and mastoid air cells are patent.  IMPRESSION: Chronic and involutional changes without evidence of focal acute abnormalities.   Electronically Signed   By: Salome Holmes M.D.   On: 09/16/2013 12:06   Mr [name] Head Wo Contrast  09/16/2013   CLINICAL DATA:  Stroke.  Left arm numbness.  EXAM: MRA HEAD WITHOUT CONTRAST  TECHNIQUE: Angiographic images of the Circle of Willis were obtained using MRA technique without intravenous contrast.  COMPARISON:  MRA 07/31/2013  FINDINGS: Both vertebral arteries are patent. Right posterior inferior cerebellar artery is patent. Left posterior inferior  cerebellar artery not visualized but could be outside the field of view. The basilar is widely patent. 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 Posterior cerebral arteries are patent bilaterally.  The internal carotid artery is widely patent bilaterally. Anterior and middle cerebral arteries are patent bilaterally without significant stenosis.  Negative for cerebral aneurysm.  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 Otherwise no significant intracranial stenosis.   Electronically Signed   By: Marlan Palau M.D.   On: 09/16/2013 16:33   Mr Brain Wo Contrast  09/16/2013   CLINICAL DATA:  Stroke.  Hypertension and diabetes.  EXAM: MRI HEAD WITHOUT CONTRAST  TECHNIQUE: Multiplanar, multiecho pulse sequences of the brain and surrounding structures were obtained without intravenous contrast.  COMPARISON:  MRI 07/31/2013  FINDINGS: New area of acute infarct involving the posterior midbrain to the right of midline. This is very close to the acute infarct seen on the prior study but is more dorsally located. There remains a small amount of residual restricted diffusion in the infarct in the right anterior pons. No other areas of acute infarct are identified on today's study.  Chronic microvascular ischemic changes in the white matter and pons. Chronic micro hemorrhage in the central pons. No cortical infarct.  Ventricle size is normal.  Negative for mass lesion.  The paranasal sinuses are clear.  IMPRESSION: Small area of acute infarct in the right paramedian posterior midbrain. This is an extension of the previous acute infarct in the right pons on 07/31/2013.  Chronic microvascular ischemic changes in the white matter and pons.   Electronically Signed   By: Marlan Palau M.D.   On: 09/16/2013 16:34    Microbiology: No results found for this or any previous visit (from the past 240 hour(s)).    Labs: Basic Metabolic Panel:  Recent Labs Lab 09/16/13 1049 09/16/13 1108  NA 139 137  K 4.4 4.2  CL 96 96  CO2 27  --   GLUCOSE 287* 292*  BUN 11 10  CREATININE 0.75 0.80  CALCIUM 10.1  --    Liver Function Tests:  Recent Labs Lab 09/16/13 1049  AST 17  ALT 20  ALKPHOS 66  BILITOT 0.7  PROT 7.7  ALBUMIN 4.1   No results found for this basename: LIPASE, AMYLASE,  in the last 168 hours No results found for this basename: AMMONIA,  in the last 168 hours CBC:  Recent Labs Lab 09/16/13 1049 09/16/13 1108  WBC 8.1  --   NEUTROABS 5.4  --   HGB 15.1 15.6  HCT 42.8 46.0  MCV 96.8  --   PLT 229  --    Cardiac Enzymes:  Recent Labs Lab 09/16/13 1049  TROPONINI <0.30   BNP: BNP (last 3 results) No results found for this basename: PROBNP,  in the last 8760 hours CBG:  Recent Labs Lab 09/16/13 1634 09/16/13 2119 09/17/13 0807 09/17/13 1209 09/17/13 1743  GLUCAP 114* 169* 173* 232* 223*       Signed:  Chasya Zenz  [facility] 09/21/2013, 10:43 PM

## 2013-10-01 ENCOUNTER — Encounter: Payer: Medicare Other | Attending: Physical Medicine & Rehabilitation

## 2013-10-01 ENCOUNTER — Inpatient Hospital Stay: Payer: Medicare Other | Admitting: Physical Medicine & Rehabilitation

## 2015-03-18 IMAGING — CR DG CHEST 2V
2 series · 2 of 2 positions shown · non-contrast
Comparison: 07/31/2013

CLINICAL DATA: Stroke

EXAM:
CHEST  2 VIEW

[w chest lat]
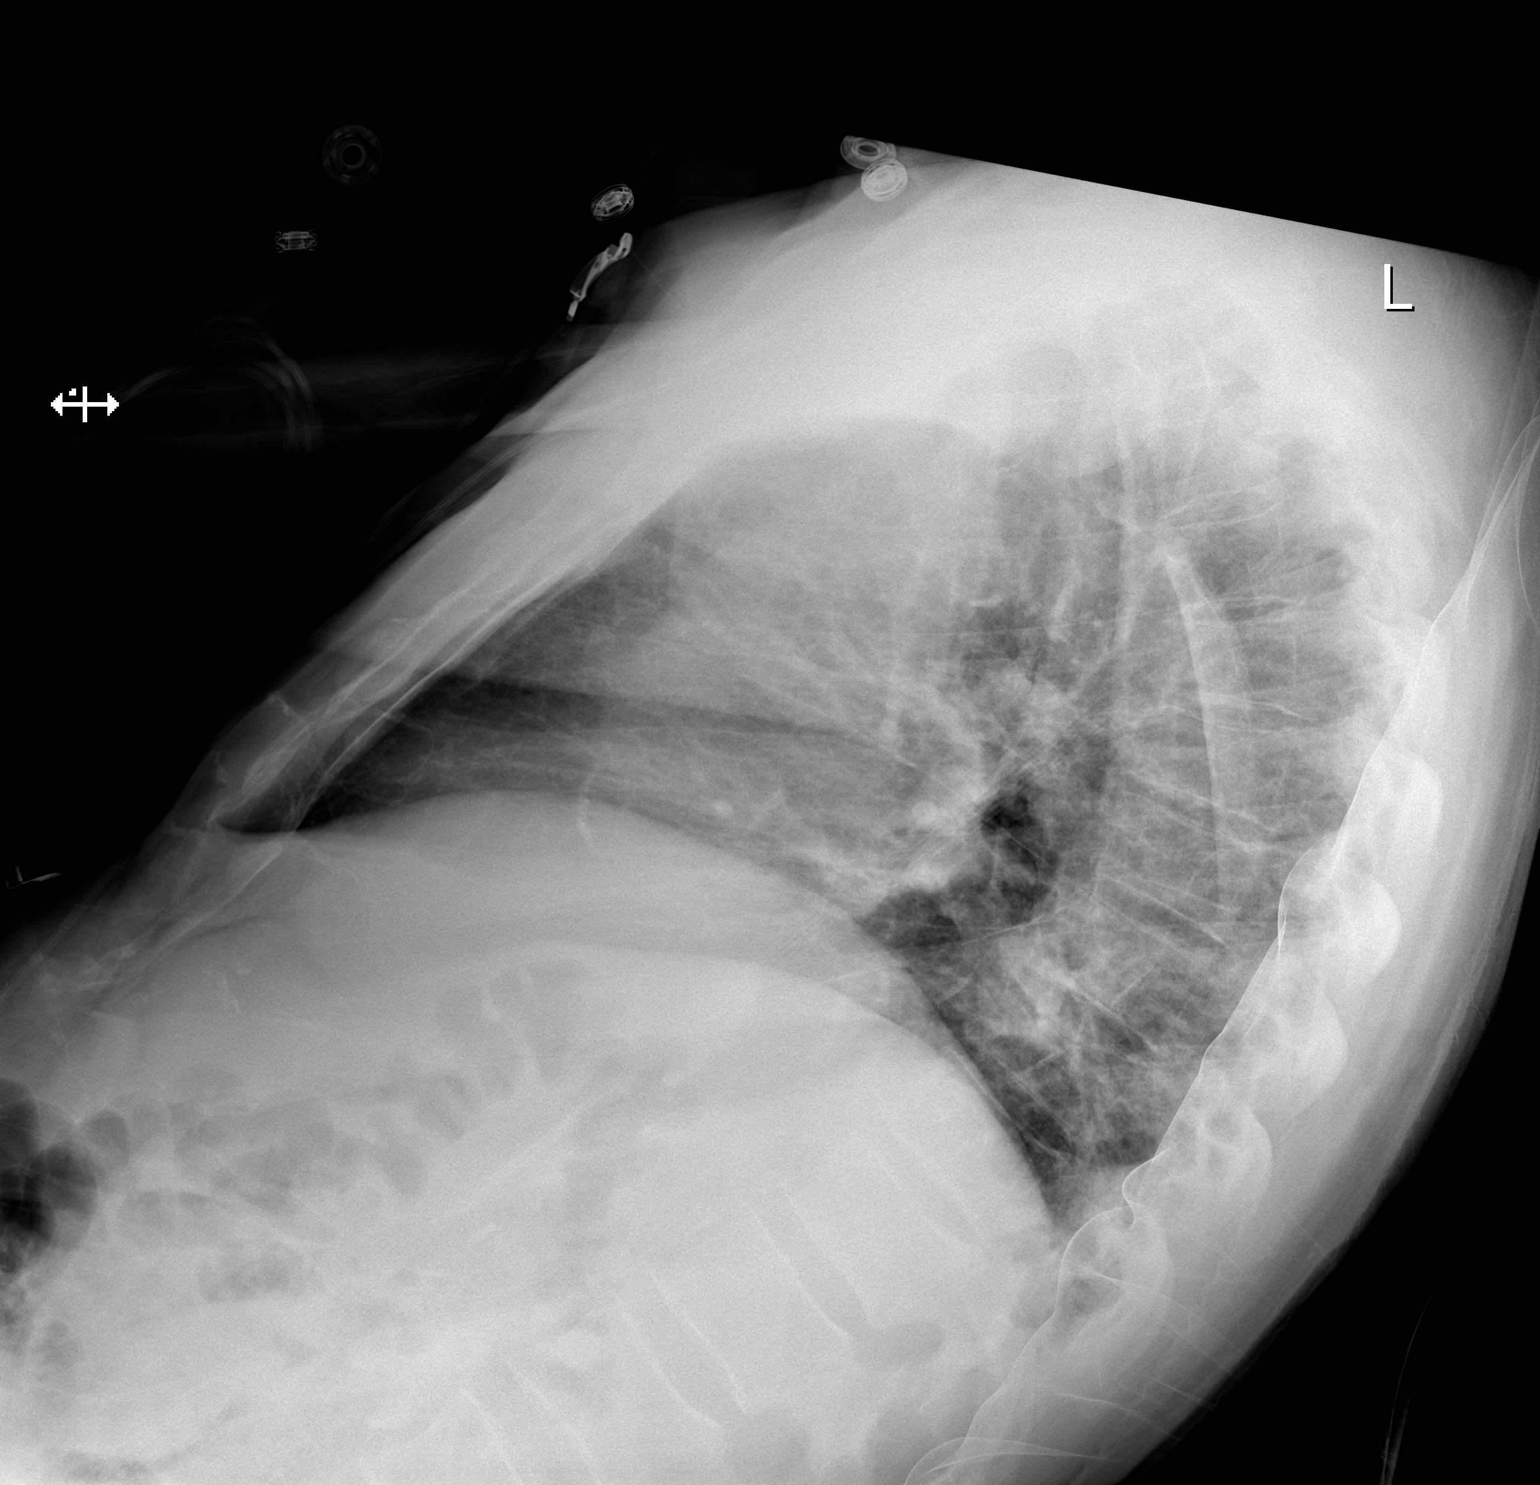

[x chest ap]
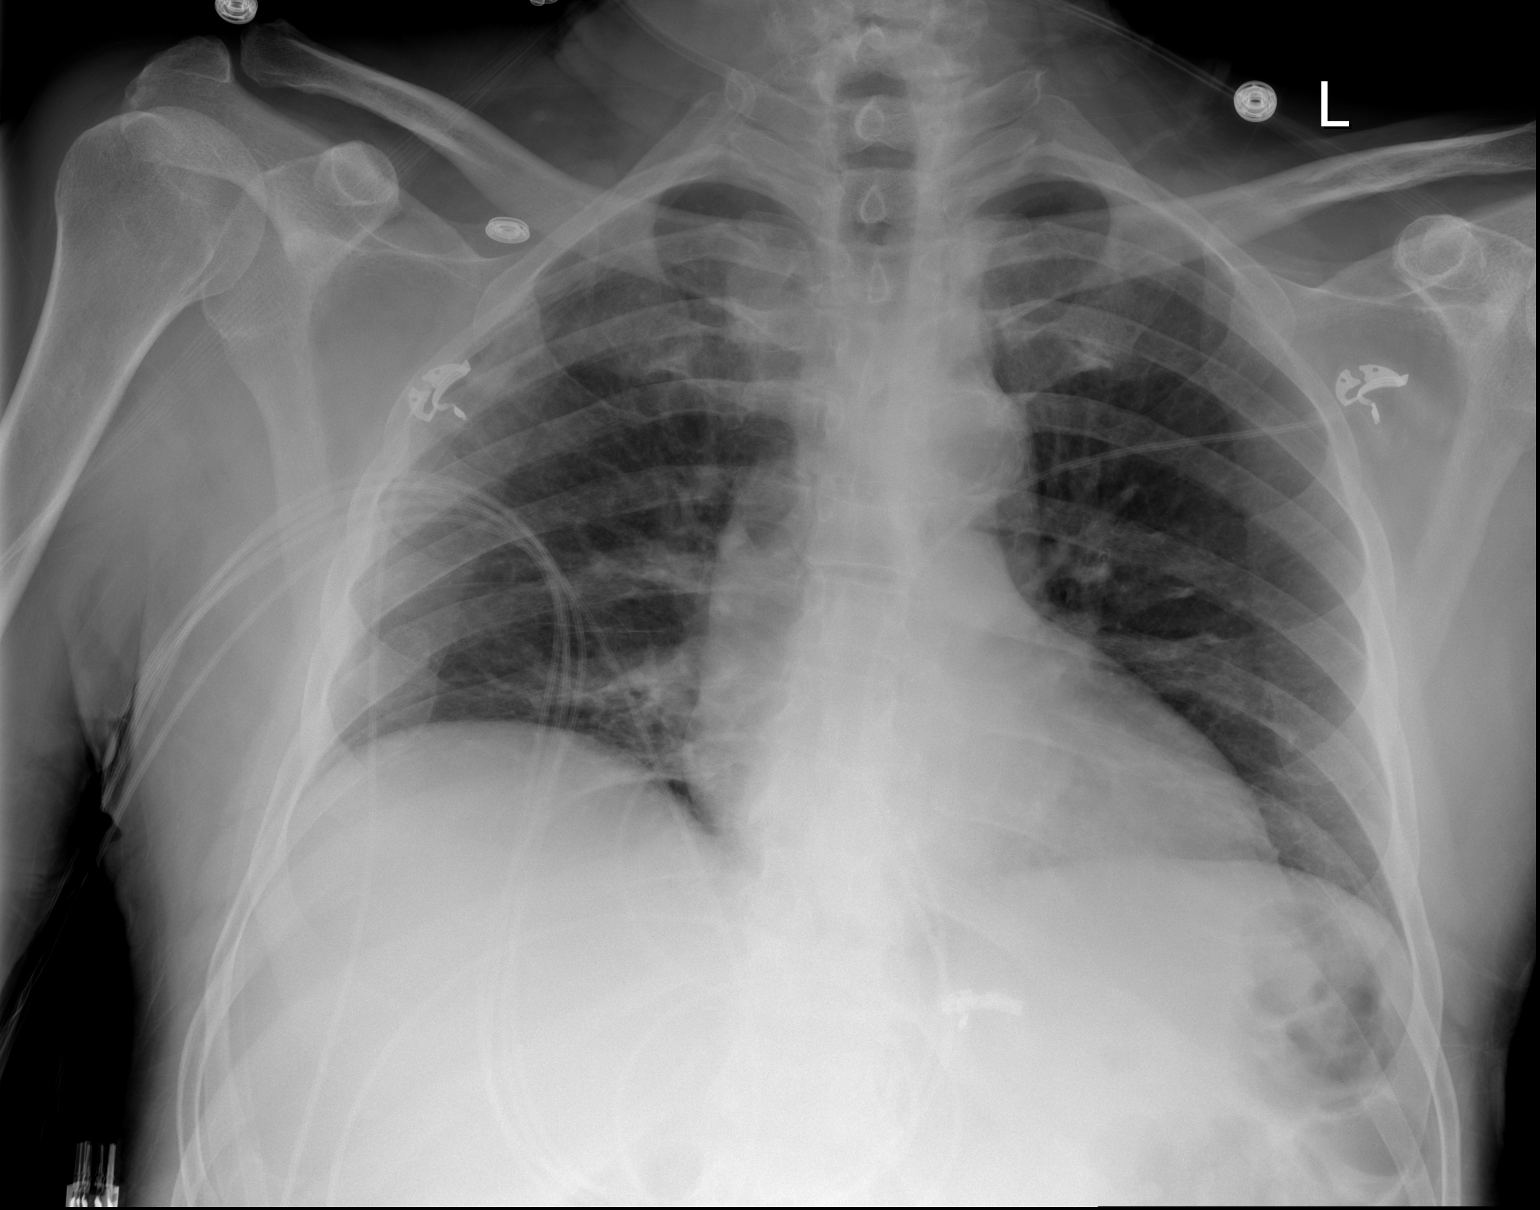

[2 of 2 positions shown; findings below may reference images not displayed]

FINDINGS: Low lung volumes with bibasilar atelectasis right greater than left.
Normal heart size. No pneumothorax. Normal vascularity.
IMPRESSION: Bibasilar atelectasis.

## 2015-03-21 IMAGING — CR DG ABD PORTABLE 1V
1 series · 1 of 1 positions shown · non-contrast
Comparison: None.

CLINICAL DATA: Feeding tube placement.

EXAM:
PORTABLE ABDOMEN - 1 VIEW

[AP]
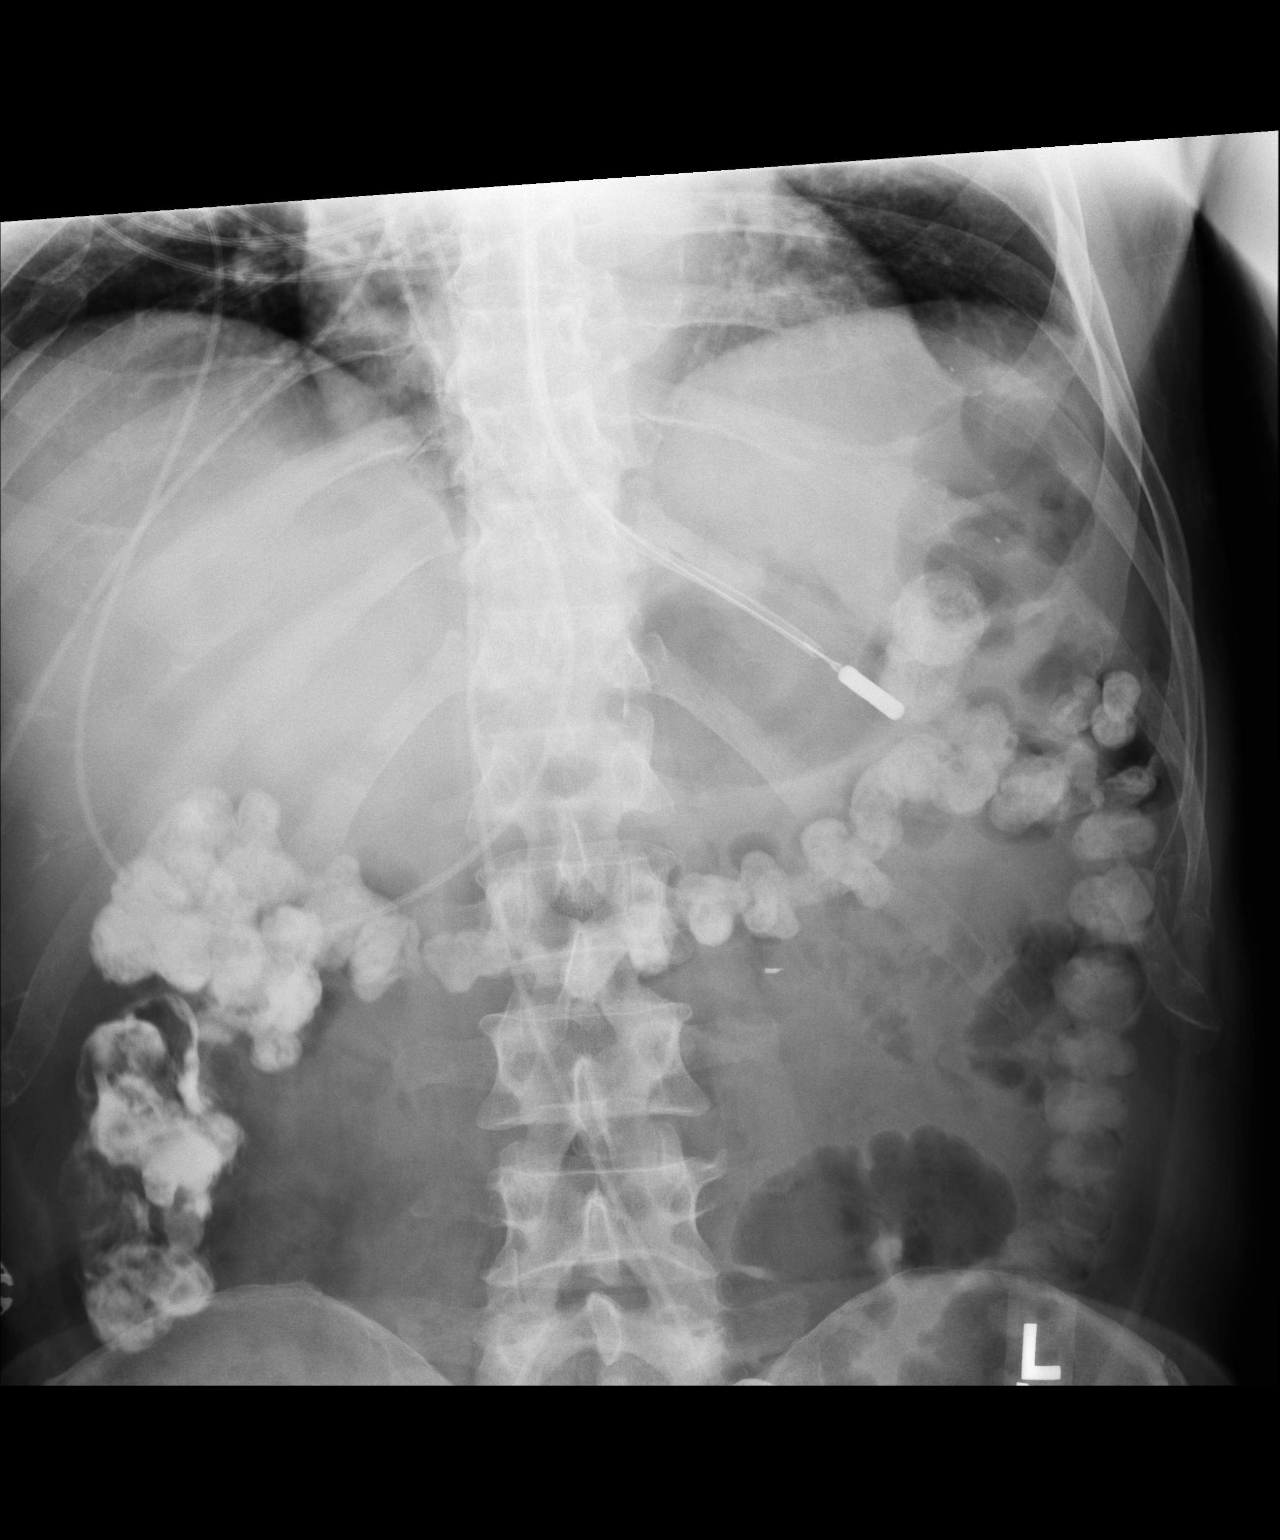

[1 of 1 positions shown; findings below may reference images not displayed]

FINDINGS: A small bore feeding tube is identified with tip overlying the
proximal -mid stomach.

Oral contrast within the colon is present.

No other significant abnormalities identified.
IMPRESSION: Small bore feeding tube with tip overlying the proximal-mid stomach.

## 2015-03-24 IMAGING — CR DG CHEST 2V
2 series · 2 of 2 positions shown · non-contrast
Comparison: Chest x-ray dated July 31, 2013.

CLINICAL DATA: Cough, on tube feedings, status post CVA

EXAM:
CHEST  2 VIEW

[w chest lat]
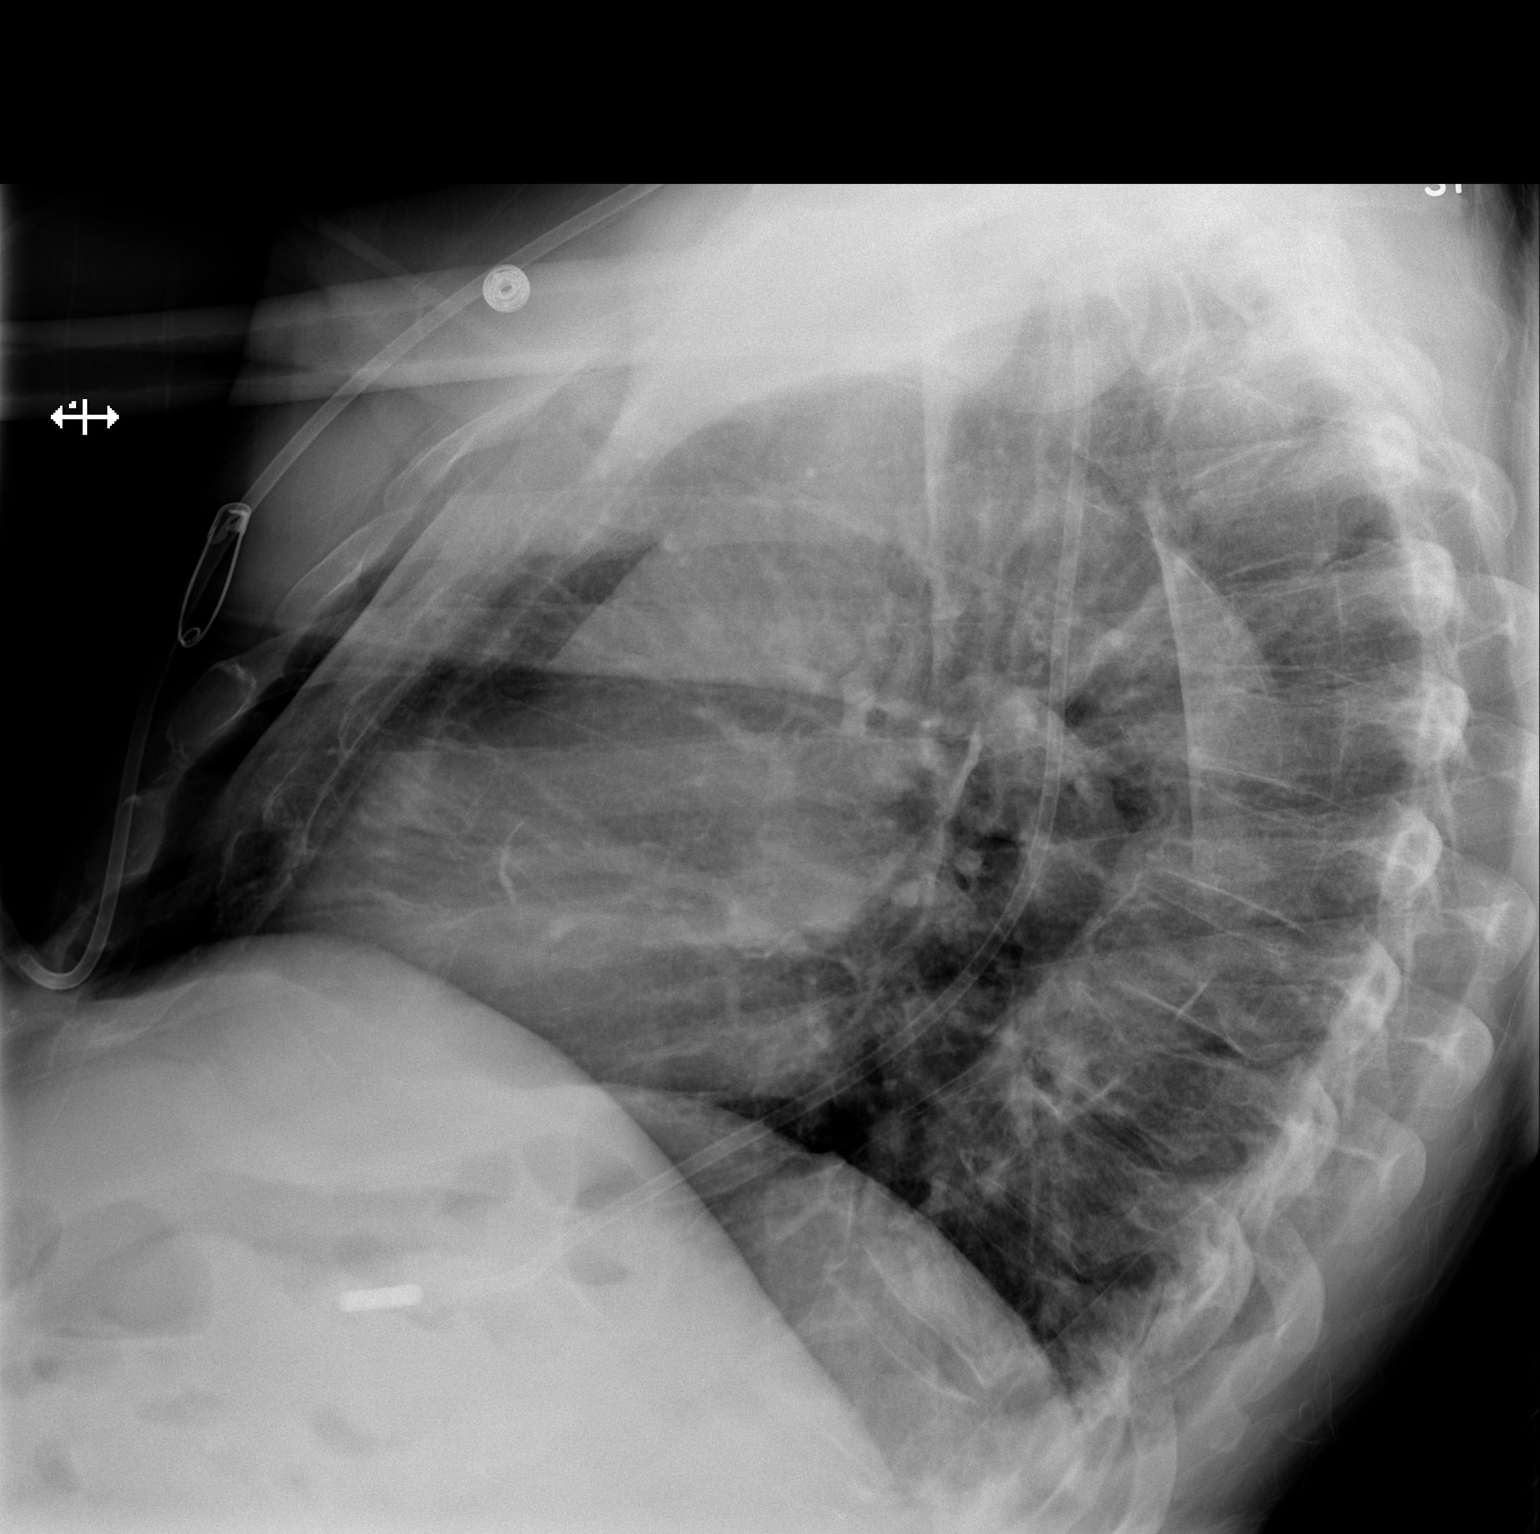

[x chest ap]
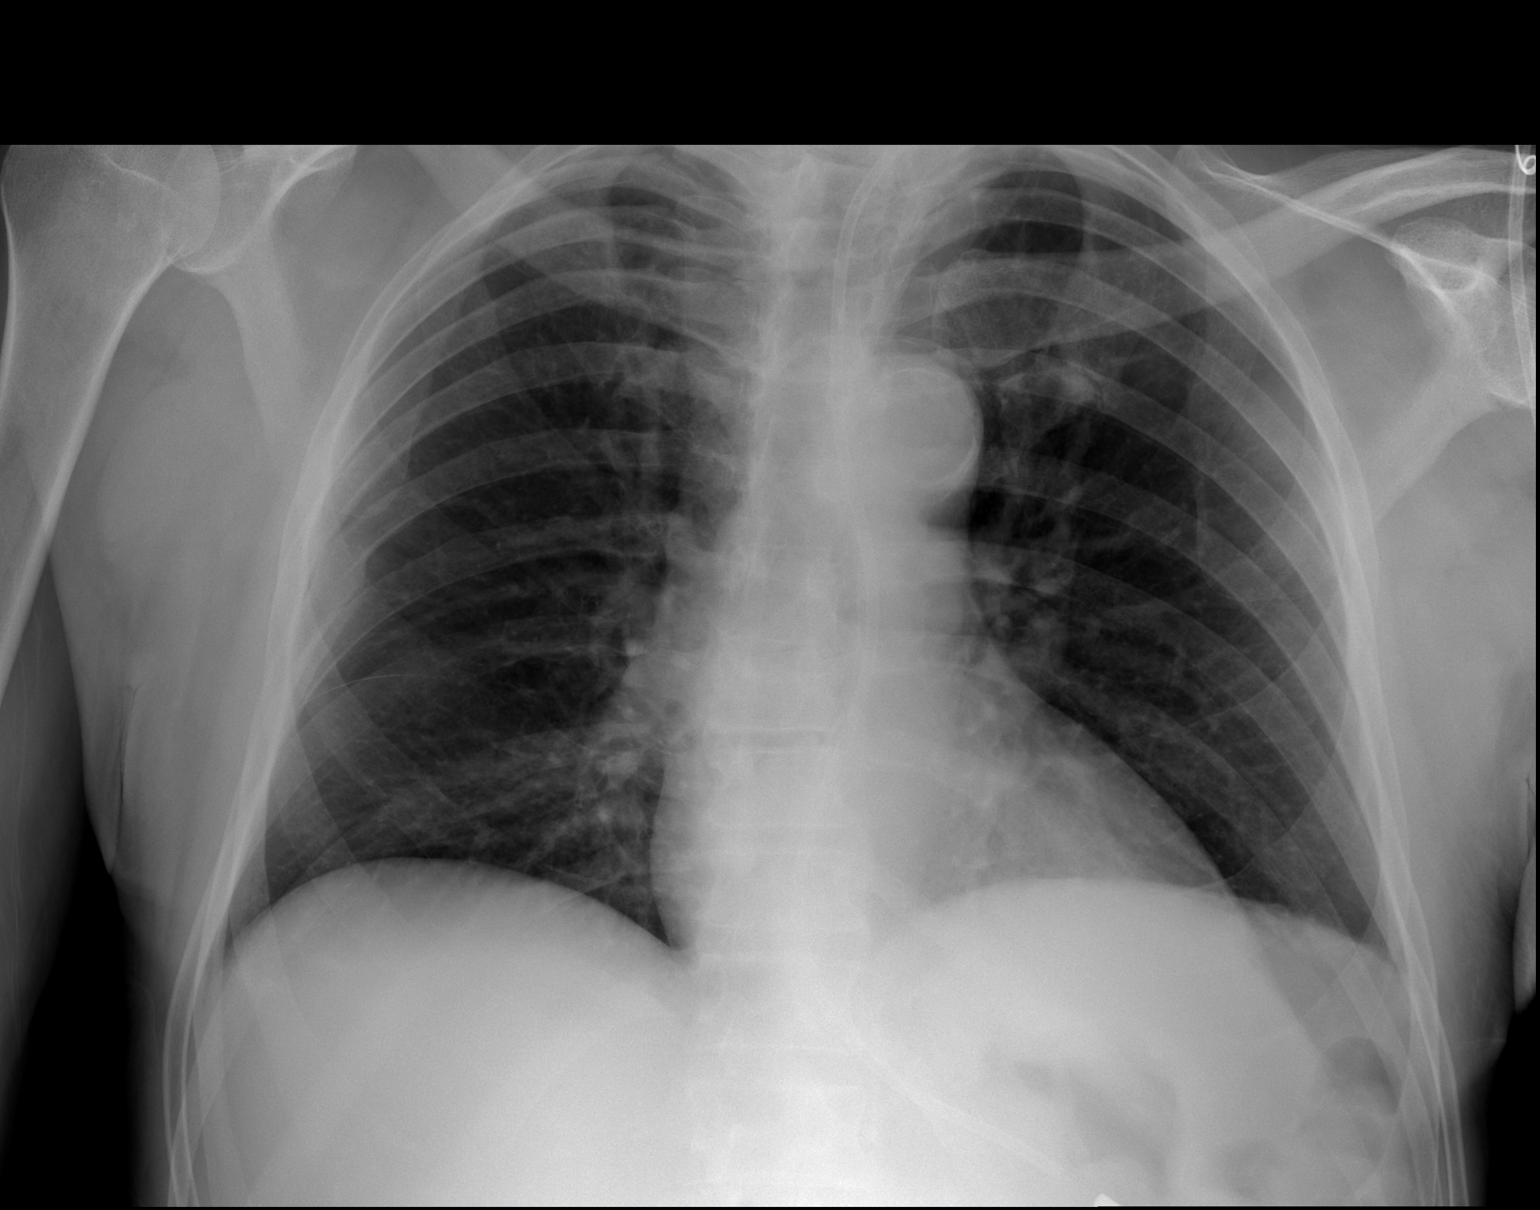

[2 of 2 positions shown; findings below may reference images not displayed]

FINDINGS: The radiodense feeding tube bulb lies in the region of the proximal
gastric body. Advancement by a 10-20 cm would be useful to assure
that the tip remains below the GE junction.

The lungs are adequately inflated and clear. The cardiopericardial
silhouette is normal in size. The pulmonary vascularity is not
engorged. The mediastinum is normal in width. There is no pleural
effusion or pneumothorax. The observed portions of the bony thorax
appear normal.
IMPRESSION: 1. There is no evidence of atelectasis or pneumonia nor other acute
cardiopulmonary abnormality.
2. Advancement of the feeding tube by 10-20 cm is recommended.
